# Patient Record
Sex: Female | Born: 1935 | Race: Black or African American | Hispanic: No | Smoking: Never smoker
Health system: Southern US, Community
[De-identification: ages and names within clinical notes are randomized; demographics above are authoritative.]

## PROBLEM LIST (undated history)

## (undated) DIAGNOSIS — H5440 Blindness, one eye, unspecified eye: Secondary | ICD-10-CM

## (undated) DIAGNOSIS — I10 Essential (primary) hypertension: Secondary | ICD-10-CM

## (undated) DIAGNOSIS — E119 Type 2 diabetes mellitus without complications: Secondary | ICD-10-CM

## (undated) DIAGNOSIS — F0390 Unspecified dementia without behavioral disturbance: Secondary | ICD-10-CM

## (undated) HISTORY — PX: REPLACEMENT TOTAL KNEE: SUR1224

---

## 2004-06-16 ENCOUNTER — Other Ambulatory Visit: Payer: Self-pay

## 2004-06-16 ENCOUNTER — Emergency Department: Payer: Self-pay | Admitting: Emergency Medicine

## 2004-12-06 ENCOUNTER — Ambulatory Visit: Payer: Self-pay | Admitting: Gastroenterology

## 2005-01-27 ENCOUNTER — Emergency Department: Payer: Self-pay | Admitting: Emergency Medicine

## 2006-02-11 ENCOUNTER — Emergency Department: Payer: Self-pay | Admitting: Internal Medicine

## 2006-06-21 ENCOUNTER — Inpatient Hospital Stay: Payer: Self-pay | Admitting: Internal Medicine

## 2006-06-21 ENCOUNTER — Other Ambulatory Visit: Payer: Self-pay

## 2007-02-22 ENCOUNTER — Ambulatory Visit: Payer: Self-pay | Admitting: Internal Medicine

## 2008-07-04 ENCOUNTER — Emergency Department: Payer: Self-pay | Admitting: Unknown Physician Specialty

## 2010-03-05 ENCOUNTER — Emergency Department: Payer: Self-pay | Admitting: Emergency Medicine

## 2010-07-25 ENCOUNTER — Emergency Department: Payer: Self-pay | Admitting: Emergency Medicine

## 2010-08-05 ENCOUNTER — Observation Stay: Payer: Self-pay | Admitting: Family Medicine

## 2010-10-10 ENCOUNTER — Emergency Department: Payer: Self-pay | Admitting: Internal Medicine

## 2010-10-27 ENCOUNTER — Emergency Department: Payer: Self-pay | Admitting: Emergency Medicine

## 2011-03-24 ENCOUNTER — Emergency Department: Payer: Self-pay | Admitting: *Deleted

## 2011-04-08 ENCOUNTER — Inpatient Hospital Stay: Payer: Self-pay | Admitting: Family Medicine

## 2011-04-08 LAB — COMPREHENSIVE METABOLIC PANEL
Albumin: 3.2 g/dL — ABNORMAL LOW (ref 3.4–5.0)
Alkaline Phosphatase: 84 U/L (ref 50–136)
Anion Gap: 6 — ABNORMAL LOW (ref 7–16)
BUN: 13 mg/dL (ref 7–18)
Bilirubin,Total: 0.4 mg/dL (ref 0.2–1.0)
Calcium, Total: 8.6 mg/dL (ref 8.5–10.1)
Creatinine: 0.78 mg/dL (ref 0.60–1.30)
EGFR (African American): >60
Glucose: 137 mg/dL — ABNORMAL HIGH (ref 65–99)
SGPT (ALT): 23 U/L
Sodium: 141 mmol/L (ref 136–145)

## 2011-04-08 LAB — CK TOTAL AND CKMB (NOT AT ARMC)
CK, Total: 314 U/L — ABNORMAL HIGH (ref 21–215)
CK, Total: 264 U/L — ABNORMAL HIGH (ref 21–215)
CK-MB: 6 ng/mL — ABNORMAL HIGH (ref 0.5–3.6)
CK-MB: 5.3 ng/mL — ABNORMAL HIGH (ref 0.5–3.6)

## 2011-04-08 LAB — CBC
HCT: 36.6 % (ref 35.0–47.0)
MCH: 29.8 pg (ref 26.0–34.0)
MCHC: 32.7 g/dL (ref 32.0–36.0)
MCV: 91 fL (ref 80–100)
Platelet: 201 10*3/uL (ref 150–440)
RBC: 4.01 10*6/uL (ref 3.80–5.20)
RDW: 13 % (ref 11.5–14.5)

## 2011-04-08 LAB — TROPONIN I: Troponin-I: 0.06 ng/mL — ABNORMAL HIGH

## 2011-04-08 LAB — PRO B NATRIURETIC PEPTIDE: B-Type Natriuretic Peptide: 2031 pg/mL — ABNORMAL HIGH (ref 0–450)

## 2011-04-09 LAB — CBC WITH DIFFERENTIAL/PLATELET
Basophil %: 0.2 %
Eosinophil #: 0 10*3/uL (ref 0.0–0.7)
Eosinophil %: 0.1 %
HCT: 33.5 % — ABNORMAL LOW (ref 35.0–47.0)
HGB: 11.1 g/dL — ABNORMAL LOW (ref 12.0–16.0)
Lymphocyte %: 15.4 %
MCH: 30.6 pg (ref 26.0–34.0)
Monocyte #: 0.2 10*3/uL (ref 0.0–0.7)
Monocyte %: 3.8 %
Neutrophil #: 4.5 10*3/uL (ref 1.4–6.5)
Neutrophil %: 80.5 %
Platelet: 186 10*3/uL (ref 150–440)

## 2011-04-09 LAB — MAGNESIUM: Magnesium: 1.9 mg/dL

## 2011-04-09 LAB — BASIC METABOLIC PANEL
Anion Gap: 12 (ref 7–16)
BUN: 20 mg/dL — ABNORMAL HIGH (ref 7–18)
Chloride: 106 mmol/L (ref 98–107)
Co2: 26 mmol/L (ref 21–32)
Creatinine: 0.99 mg/dL (ref 0.60–1.30)
EGFR (African American): >60
Potassium: 3.9 mmol/L (ref 3.5–5.1)
Sodium: 144 mmol/L (ref 136–145)

## 2011-04-09 LAB — TROPONIN I: Troponin-I: 0.05 ng/mL

## 2011-04-09 LAB — CK TOTAL AND CKMB (NOT AT ARMC): CK, Total: 183 U/L (ref 21–215)

## 2011-04-10 LAB — CBC WITH DIFFERENTIAL/PLATELET
Basophil #: 0 10*3/uL (ref 0.0–0.1)
Basophil %: 0.2 %
Eosinophil #: 0 10*3/uL (ref 0.0–0.7)
Eosinophil %: 0.1 %
HGB: 11.4 g/dL — ABNORMAL LOW (ref 12.0–16.0)
Lymphocyte %: 17 %
MCH: 30.3 pg (ref 26.0–34.0)
MCV: 92 fL (ref 80–100)
Monocyte #: 0.4 10*3/uL (ref 0.0–0.7)
Neutrophil %: 78.8 %
Platelet: 196 10*3/uL (ref 150–440)
RBC: 3.78 10*6/uL — ABNORMAL LOW (ref 3.80–5.20)
RDW: 14.1 % (ref 11.5–14.5)

## 2011-04-10 LAB — BASIC METABOLIC PANEL
Anion Gap: 13 (ref 7–16)
BUN: 20 mg/dL — ABNORMAL HIGH (ref 7–18)
Co2: 26 mmol/L (ref 21–32)
Creatinine: 0.87 mg/dL (ref 0.60–1.30)
EGFR (African American): >60
Sodium: 145 mmol/L (ref 136–145)

## 2011-04-13 LAB — CULTURE, BLOOD (SINGLE)

## 2011-09-02 ENCOUNTER — Ambulatory Visit: Payer: Self-pay | Admitting: Family Medicine

## 2011-12-15 ENCOUNTER — Emergency Department: Payer: Self-pay | Admitting: Internal Medicine

## 2011-12-15 LAB — COMPREHENSIVE METABOLIC PANEL
Albumin: 3.7 g/dL (ref 3.4–5.0)
Alkaline Phosphatase: 108 U/L (ref 50–136)
Anion Gap: 8 (ref 7–16)
BUN: 12 mg/dL (ref 7–18)
Bilirubin,Total: 0.4 mg/dL (ref 0.2–1.0)
Co2: 26 mmol/L (ref 21–32)
Creatinine: 0.81 mg/dL (ref 0.60–1.30)
Glucose: 202 mg/dL — ABNORMAL HIGH (ref 65–99)
Osmolality: 279 (ref 275–301)
SGPT (ALT): 26 U/L (ref 12–78)
Sodium: 137 mmol/L (ref 136–145)
Total Protein: 7.9 g/dL (ref 6.4–8.2)

## 2011-12-15 LAB — CK TOTAL AND CKMB (NOT AT ARMC)
CK, Total: 268 U/L — ABNORMAL HIGH (ref 21–215)
CK-MB: 5.8 ng/mL — ABNORMAL HIGH (ref 0.5–3.6)

## 2011-12-15 LAB — URINALYSIS, COMPLETE
Bilirubin,UR: NEGATIVE
Blood: NEGATIVE
Glucose,UR: 50 mg/dL (ref 0–75)
Ketone: NEGATIVE
Leukocyte Esterase: NEGATIVE
Ph: 8 (ref 4.5–8.0)
RBC,UR: 1 /HPF (ref 0–5)
Specific Gravity: 1.009 (ref 1.003–1.030)
Squamous Epithelial: 1
WBC UR: <1 /HPF (ref 0–5)

## 2011-12-15 LAB — CBC
HGB: 12.8 g/dL (ref 12.0–16.0)
MCH: 30.8 pg (ref 26.0–34.0)
RBC: 4.16 10*6/uL (ref 3.80–5.20)
RDW: 13.5 % (ref 11.5–14.5)
WBC: 7 10*3/uL (ref 3.6–11.0)

## 2011-12-15 LAB — TROPONIN I: Troponin-I: 0.03 ng/mL

## 2011-12-28 ENCOUNTER — Inpatient Hospital Stay: Payer: Self-pay | Admitting: Family Medicine

## 2011-12-28 LAB — COMPREHENSIVE METABOLIC PANEL
Albumin: 3.7 g/dL (ref 3.4–5.0)
Anion Gap: 8 (ref 7–16)
BUN: 10 mg/dL (ref 7–18)
Calcium, Total: 8.7 mg/dL (ref 8.5–10.1)
Chloride: 107 mmol/L (ref 98–107)
Co2: 27 mmol/L (ref 21–32)
EGFR (African American): >60
EGFR (Non-African Amer.): >60
Glucose: 128 mg/dL — ABNORMAL HIGH (ref 65–99)
Osmolality: 284 (ref 275–301)
Potassium: 3.9 mmol/L (ref 3.5–5.1)
SGOT(AST): 23 U/L (ref 15–37)
SGPT (ALT): 23 U/L (ref 12–78)
Total Protein: 7.7 g/dL (ref 6.4–8.2)

## 2011-12-28 LAB — URINALYSIS, COMPLETE
Bacteria: NONE SEEN
Bilirubin,UR: NEGATIVE
Blood: NEGATIVE
Glucose,UR: 50 mg/dL (ref 0–75)
Leukocyte Esterase: NEGATIVE
RBC,UR: 2 /HPF (ref 0–5)
Specific Gravity: 1.013 (ref 1.003–1.030)
Squamous Epithelial: 1
WBC UR: <1 /HPF (ref 0–5)

## 2011-12-28 LAB — CBC WITH DIFFERENTIAL/PLATELET
Basophil %: 1.3 %
Eosinophil #: 0 10*3/uL (ref 0.0–0.7)
Eosinophil %: 0.8 %
HCT: 40.2 % (ref 35.0–47.0)
HGB: 13.4 g/dL (ref 12.0–16.0)
Lymphocyte #: 1.5 10*3/uL (ref 1.0–3.6)
MCH: 31.4 pg (ref 26.0–34.0)
MCHC: 33.5 g/dL (ref 32.0–36.0)
MCV: 94 fL (ref 80–100)
Monocyte #: 0.3 x10 3/mm (ref 0.2–0.9)
Neutrophil #: 3.3 10*3/uL (ref 1.4–6.5)
Neutrophil %: 63.5 %
Platelet: 180 10*3/uL (ref 150–440)
RBC: 4.28 10*6/uL (ref 3.80–5.20)
RDW: 13.4 % (ref 11.5–14.5)

## 2011-12-28 LAB — LIPID PANEL
Cholesterol: 203 mg/dL — ABNORMAL HIGH (ref 0–200)
Ldl Cholesterol, Calc: 87 mg/dL (ref 0–100)
Triglycerides: 154 mg/dL (ref 0–200)
VLDL Cholesterol, Calc: 31 mg/dL (ref 5–40)

## 2011-12-28 LAB — HEMOGLOBIN A1C: Hemoglobin A1C: 6.7 % — ABNORMAL HIGH (ref 4.2–6.3)

## 2011-12-29 LAB — CBC WITH DIFFERENTIAL/PLATELET
Basophil #: 0.1 10*3/uL (ref 0.0–0.1)
Basophil %: 1.3 %
Eosinophil %: 0.1 %
HGB: 12.6 g/dL (ref 12.0–16.0)
Lymphocyte #: 0.9 10*3/uL — ABNORMAL LOW (ref 1.0–3.6)
Lymphocyte %: 20.3 %
MCH: 31 pg (ref 26.0–34.0)
MCHC: 33.3 g/dL (ref 32.0–36.0)
MCV: 93 fL (ref 80–100)
Monocyte %: 3.6 %
Neutrophil #: 3.4 10*3/uL (ref 1.4–6.5)
Neutrophil %: 74.7 %
RBC: 4.04 10*6/uL (ref 3.80–5.20)
WBC: 4.6 10*3/uL (ref 3.6–11.0)

## 2011-12-29 LAB — BASIC METABOLIC PANEL
Anion Gap: 6 — ABNORMAL LOW (ref 7–16)
Calcium, Total: 8.7 mg/dL (ref 8.5–10.1)
Chloride: 106 mmol/L (ref 98–107)
Co2: 28 mmol/L (ref 21–32)
Creatinine: 0.8 mg/dL (ref 0.60–1.30)
EGFR (Non-African Amer.): >60
Osmolality: 280 (ref 275–301)
Sodium: 140 mmol/L (ref 136–145)

## 2011-12-29 LAB — CK TOTAL AND CKMB (NOT AT ARMC)
CK, Total: 205 U/L (ref 21–215)
CK, Total: 197 U/L (ref 21–215)
CK-MB: 3.9 ng/mL — ABNORMAL HIGH (ref 0.5–3.6)
CK-MB: 3.6 ng/mL (ref 0.5–3.6)

## 2011-12-29 LAB — TROPONIN I
Troponin-I: 0.04 ng/mL
Troponin-I: 0.04 ng/mL

## 2011-12-30 LAB — BASIC METABOLIC PANEL
BUN: 11 mg/dL (ref 7–18)
Calcium, Total: 9.3 mg/dL (ref 8.5–10.1)
Chloride: 103 mmol/L (ref 98–107)
Co2: 28 mmol/L (ref 21–32)
Creatinine: 0.89 mg/dL (ref 0.60–1.30)
Potassium: 3.8 mmol/L (ref 3.5–5.1)

## 2011-12-31 LAB — BASIC METABOLIC PANEL
Calcium, Total: 9.1 mg/dL (ref 8.5–10.1)
EGFR (African American): 59 — ABNORMAL LOW
EGFR (Non-African Amer.): 51 — ABNORMAL LOW
Osmolality: 282 (ref 275–301)
Potassium: 3.5 mmol/L (ref 3.5–5.1)
Sodium: 140 mmol/L (ref 136–145)

## 2014-05-09 NOTE — H&P (Signed)
PATIENT NAME:  Stephanie Graham, Stephanie Graham MR#:  [id number] DATE OF BIRTH:  Jun 19, 1921  DATE OF ADMISSION:  12/28/2011  ADMITTING PHYSICIAN: Enid Baas, MD    PRIMARY MD: Dr. Burnadette Pop   CHIEF COMPLAINT: Headache and right-sided blurred vision.   HISTORY OF PRESENT ILLNESS: Stephanie Graham is an 81 year old African American female with past medical history significant for non-insulin-dependent diabetes mellitus, hypertension, hyperlipemia, and also congestive heart failure with systolic dysfunction, EF of 40% who comes from home secondary to worsening headache and also right eye blurred vision that started today. The patient denies being sick lately. She states that she has been taking her medications like she is supposed to. She is only on low dose blood pressure medications, lisinopril 20 mg daily and Coreg 6.25 mg b.i.d. She says she has not been stressed out or anxious or in pain lately. She has never had this episode of high blood pressure. She called EMS for her headache and right eye blurred vision today and her blood pressure was found to be as high as 226/125. She was brought in to the ED. She was given labetalol IV and also one dose of IV hydralazine with not much improvement in her blood pressure so she is being admitted for malignant hypertension.   PAST MEDICAL HISTORY:  1. Hypertension.  2. Non-insulin-dependent diabetes mellitus.  3. Hyperlipidemia.  4. Congestive heart failure with systolic dysfunction, EF of 40%.   PAST SURGICAL HISTORY:  1. Vaginal hysterectomy.  2. Right knee surgery.   ALLERGIES TO MEDICATIONS: Intolerant to verapamil, Maxzide, and simvastatin. No known drug allergies otherwise.   CURRENT MEDICATIONS AT HOME:  1. Tylenol 500 mg p.o. q.8 hours p.r.n.  2. Coreg 6.25 mg p.o. b.i.d.  3. Donepezil 10 mg p.o. at bedtime.  4. Glimepiride 2 mg p.o. daily.  5. Lisinopril 20 mg p.o. daily.  6. Pravastatin 20 mg p.o. daily.   SOCIAL HISTORY: Lives at home by  herself. Her daughter comes and stays with her at nighttime if she needs. No history of any smoking, alcohol, or drug use.   FAMILY HISTORY: Dad lived up to 21 years old and mom died in her 19's from stroke.   REVIEW OF SYSTEMS: CONSTITUTIONAL: No fever, fatigue, or weakness. EYES: Positive for right eye blurred vision and also cataracts. No glaucoma or inflammation. ENT: No tinnitus, ear pain, epistaxis, or discharge. Positive for moderate hearing loss. RESPIRATORY: No cough, wheeze, hemoptysis, or COPD. CARDIOVASCULAR: No chest pain, orthopnea, edema, arrhythmia, palpitations, or syncope. GI: No nausea, vomiting, diarrhea, abdominal pain, hematemesis, or melena. GU: No dysuria, hematuria, renal calculus, frequency, or incontinence. ENDOCRINE: No polyuria, nocturia, thyroid problems, heat or cold intolerance. HEMATOLOGY: No anemia, easy bruising or bleeding. SKIN: No acne, rash, or lesions. MUSCULOSKELETAL: No neck, back, shoulder pain, arthritis, or gout. Positive for headache. NEUROLOGIC: Other than headache in right cerebellar region, no prior CVA, TIA, or seizures. PSYCHOLOGICAL: No anxiety, insomnia, or depression.   PHYSICAL EXAMINATION:   VITAL SIGNS: Temperature 98 degrees Fahrenheit, pulse 67, respirations 18, blood pressure 226/125, pulse oximetry 99% on room air.   GENERAL: Well built, well nourished female lying in bed not in any acute distress.   HEENT: Normocephalic, atraumatic. The right pupil is 3 mm with sluggish reaction to light. Left pupil is 2 mm with normal reaction to light. Anicteric sclerae. Extraocular movements are intact. Oropharynx is clear without erythema, mass, or exudates.   NECK: Supple. No thyromegaly, JVD, or carotid bruits. No lymphadenopathy.   LUNGS: Moving air  bilaterally. No wheeze or crackles. No use of accessory muscles for breathing.   CARDIOVASCULAR: S1, S2 regular rate and rhythm. No murmurs, rubs, or gallops.   ABDOMEN: Soft, nontender,  nondistended. No hepatosplenomegaly. Normal bowel sounds.   EXTREMITIES: No pedal edema. No clubbing or cyanosis. 2+ dorsalis pedis pulses palpable bilaterally.   SKIN: No acne, rash, or lesions. Very dry and flaky skin.   LYMPHATICS: No cervical lymphadenopathy.   NEUROLOGIC: She has 5/5 strength in all four extremities. No focal sensory symptoms. All cranial nerves are intact except she has some right-sided hemianopsia on exam.   PSYCHOLOGICAL: The patient is awake, alert, oriented and x3.   LABORATORY, DIAGNOSTIC, AND RADIOLOGICAL DATA: WBC 5.2, hemoglobin 13.4, hematocrit 40.2, platelet count 180, sodium 142, potassium 3.9, chloride 107, bicarb 27, BUN 10, creatinine 0.84, glucose 128, calcium 8.7, ALT 23, AST 23, alkaline phosphatase 108, total bilirubin 0.4, calcium 8.7, albumin 3.7. First set of troponin slightly elevated at 0.06.   Chest x-ray is showing no acute cardiopulmonary disease, mild right hilar prominence secondary to the patient's positioning. No focal pulmonary opacities or edema seen.   CT of the head without contrast showing no acute intracranial findings. Paranasal sinus disease is present. Chronic microangiopathy is present.   EKG showing normal sinus rhythm, heart rate of 65, no acute ST-T wave abnormalities.   ASSESSMENT AND PLAN: This is an 81 year old female with history of hypertension, diabetes, and CHF with systolic dysfunction admitted for malignant hypertension, headache, and right-sided blurred vision. 1. Malignant hypertension. Blood pressure not improving with IV dose of labetalol and hydralazine so starting on labetalol drip. Will admit to CCU. Restart her oral medications when can be weaned off the drip and adjust the doses once blood pressure is reasonably controlled.  2. Right-sided blurred vision likely from elevated intracranial pressure from her malignant hypertension, cannot rule out TIA. The patient is not taking any aspirin or other antiplatelet  agents at home. Discussed with neurologist, Dr. Sherryll BurgerShah, over the phone and ordering MRI, Doppler's, and echocardiogram at this time. CT of the head is negative.  3. Diabetes mellitus. On sliding scale insulin and glimepiride.  4. Hyperlipidemia. Continue statin.  5. GI and DVT prophylaxis. On Protonix and sub-Q heparin.   CODE STATUS: FULL CODE.   TIME SPENT ON ADMISSION: 50 minutes. The patient will be transferred to Dr. Sherryll BurgerLinthavong's service.    ____________________________ Enid Baasadhika Zoei Amison, MD rk:drc D: 12/28/2011 21:46:07 ET T: 12/29/2011 07:11:03 ET JOB#: [id number]  cc: Enid Baasadhika Flora Parks, MD, <Dictator> Marisue IvanKanhka Linthavong, MD Enid BaasADHIKA Markon Jares MD ELECTRONICALLY SIGNED 01/06/2012 14:59

## 2014-05-09 NOTE — Discharge Summary (Signed)
PATIENT NAME:  Stephanie Graham, Stephanie Graham MR#:  [id number] DATE OF BIRTH:  [birth year]  DATE OF ADMISSION:  12/28/2011 DATE OF DISCHARGE:  12/31/2011  DISCHARGE DIAGNOSES:  1. Malignant hypertension.  2. Adult onset diabetes.  3. Systolic congestive heart failure with ejection fraction 40%.  4. Dementia.  5. Hypertriglyceridemia.  6. Bilateral cataracts.   DISCHARGE MEDICATIONS:  1. Coreg 6.25 mg p.o. b.i.d.  2. Pravastatin 20 mg p.o. at bedtime.  3. Glimepiride 2 mg p.o. daily.  4. Donepezil 10 mg p.o. at bedtime.  5. Acetaminophen 500 mg t.i.d. p.r.n. for pain.  6. Lisinopril 40 mg p.o. daily.  7. Aspirin 81 mg p.o. daily.   CONSULTS: None.   PROCEDURES: The patient had an MRI of the brain that was negative. The patient also had carotid Doppler's that were negative.   PERTINENT LABS PRIOR TO DISCHARGE: Sodium 140, potassium 3.5, creatinine 0.99, glucose 97, A1c of 6.7%, LDL 87.   BRIEF HOSPITAL COURSE:  1. Malignant hypertension: The patient initially came in with elevated blood pressures greater than 200 systolic with symptoms associated, which included headaches and visual disturbances. Other symptoms have started to improve. She continues to have some tearing of the right eye without any pain.  The plan is for her to be evaluated by ophthalmology as an outpatient and we will set that up in the clinic. As far as her blood pressure goes, we will continue with the Coreg as prescribed but we increased the lisinopril to 40 mg daily. There is some history of poor compliance on the patient's part due to her underlying dementia. I spoke to her daughter. Plan to have Home Health nursing monitor for medication compliance. She will follow up closely with me in the outpatient setting next week and monitor her blood pressures that way.  2. Other chronic medical issues remained stable. No changes to those regimens.   DISPOSITION: Stable condition, being discharged to home with Home Health nursing to  help with medication compliance.   FOLLOWUP: Follow up with Dr. Burnadette PopLinthavong in a week.    ____________________________ Stephanie IvanKanhka Belinda Bringhurst, MD kl:bjt D: 12/31/2011 08:10:42 ET T: 12/31/2011 10:33:31 ET JOB#: [id number]  cc: Stephanie IvanKanhka Zigmund Linse, MD, <Dictator> Stephanie IvanKANHKA Dainelle Hun MD ELECTRONICALLY SIGNED 12/31/2011 12:17

## 2014-05-14 NOTE — Consult Note (Signed)
General Aspect patient is an 81 year old female with history of systemic arterial hypertension, diabetes and hyperlipidemia who presented to the emergency room with progressive cough productive of yellow sputum with associated shortness of breath weakness and fatigue. She complained of chills. She has a history of pulmonary hypertension by echo done in the recent past with evidence of a cardiomyopathy by echo done at [facility] with a reported ejection fraction of 37%. She presented to the emergency room where she was evaluated. Her serum troponin was mildly elevated at 0.06 with a subsequent level of 0.09 she denied exertional chest pain but complained of pleuritic chest pain with cough productive of yellowish sputum. She is now admitted where she is resting comfortably in bed. She states she is less short of breath. She denies chest pain. She denies prior cardiac history. Risk factors include hypertension, diabetes and hyperlipidemia. She is currently hemodynamically stable. Electrocardiogram reveals sinus rhythm with nonspecific ST-T wave changes.   Physical Exam:   GEN NAD, obese    HEENT PERRL    NECK supple  No masses    RESP rhonchi    CARD Regular rate and rhythm  Murmur    Murmur Systolic    Systolic Murmur Out flow    ABD denies tenderness  normal BS    LYMPH negative neck    EXTR negative cyanosis/clubbing, positive edema    SKIN normal to palpation    NEURO cranial nerves intact, motor/sensory function intact    PSYCH poor insight   Review of Systems:   Subjective/Chief Complaint cough with shortness of breath, fever and chills    General: Fever/chills  Weakness    Skin: No Complaints    ENT: No Complaints    Eyes: No Complaints    Neck: No Complaints    Respiratory: Frequent cough  Short of breath  Sputum    Cardiovascular: pleuritic chest pain    Gastrointestinal: No Complaints    Genitourinary: No Complaints    Vascular: No Complaints     Musculoskeletal: No Complaints    Neurologic: No Complaints    Hematologic: No Complaints    Endocrine: No Complaints    Psychiatric: No Complaints    Review of Systems: All other systems were reviewed and found to be negative    Medications/Allergies Reviewed Medications/Allergies reviewed   EKG:   EKG NSR    No Known Allergies:     Impression patient is an 81 year old female with history of mild cardiomyopathy by echocardiogram at [facility] with ejection fraction of 37%. She was admitted with increasing shortness of breath with cough productive of yellowish sputum. She has a mild serum troponin elevation. She complained of cough productive of yellow sputum with fever and chills but denies exertional chest pain. She denies any prior cardiac history although is somewhat difficult historian secondary to mild dementia. She has a mild serum troponin elevation which appears to be of unclear etiology. Most likely appears to be secondary to increased demand as the patient does not appear to have suffered an acute coronary event. Would continue to aggressively treat her upper respiratory infection. Chest x-ray does not reveal any significant chest of heart failure or pneumonic process.    Plan 1. Continue to treat upper respiratory infection with empiric antibiotics and bronchodilators as needed 2. Continue to treat her diabetes mellitus, hypertension hyperlipidemia you're doing 3. Echocardiogram to evaluate left ventricular function and for evidence of the artery disease 4. Low-fat, low-cholesterol, low-sodium, ADA diet  5. Would not proceed with cardiac invasive evaluation at this time unless clinical course changes. We'll follow with you   Electronic Signatures: Dalia HeadingFath, Yina Riviere A (MD)  (Signed 19-Mar-13 21:55)  Authored: General Aspect/Present Illness, History and Physical Exam, Review of System, EKG , Allergies, Impression/Plan   Last Updated: 19-Mar-13 21:55 by Dalia HeadingFath, Cydnee Fuquay A  (MD)

## 2014-05-14 NOTE — Discharge Summary (Signed)
PATIENT NAME:  Stephanie Graham, Stephanie Graham MR#:  [id number] DATE OF BIRTH:  1921/12/12  DATE OF ADMISSION:  04/08/2011 DATE OF DISCHARGE:  04/10/2011   DISCHARGE DIAGNOSES:  1. Acute exertional dyspnea that is improved.  2. Systolic congestive heart failure with an EF of 40% on echo 03/2011.  3. Hypertension.  4. Adult onset diabetes.  5. Hypertriglyceridemia.  6. Dementia.   DISCHARGE MEDICATIONS:  1. Aspirin 81 mg p.o. daily.  2. Amaryl 2 mg p.o. daily.  3. Coreg 6.25 mg p.o. b.i.d.  4. Pravastatin 20 mg p.o. daily.  5. Lisinopril 20 mg p.o. daily.  6. Prednisone taper as directed.  7. Doxy 100 mg p.o. b.i.d. x8 more days.  8. Albuterol 90 mcg 2 puffs q.4 hours p.r.n. for wheezing.   CONSULT: Cardiology per Dr. Lady GaryFath    PROCEDURES: The patient underwent an echocardiogram that did show an EF of 40% and did show mild global hypokinesis.   PERTINENT LABS ON DAY OF DISCHARGE: Sodium 145, potassium 3.6, BUN 20, creatinine 0.87, glucose 106, white blood cell count 9.6, hemoglobin 11.4, platelets 196. BNP upon arrival was 2031.   BRIEF HOSPITAL COURSE:  1. Exertional dyspnea. The patient initially arrived with complaints of progressive exertional dyspnea. Chest x-ray was negative for acute pulmonary edema or acute infiltrate. It was thought that her exertional dyspnea was multifactorial secondary to acute bronchitis or underlying systolic congestive heart failure. She was placed on oral antibiotics and switched to doxy and started on prednisone and inhaled albuterol. She responded very well. She underwent echocardiogram per Cardiology recommendations which did show an EF of 40%. Her previous EF was 37% in July. Breathing has drastically improved and she is able to ambulate without issues at this time.  2. History of systolic congestive heart failure. The patient has EF of 40% seen on the echo done during his hospitalization. No significant changes except for increase in lisinopril to 20 mg because of  her blood pressure. There are no signs of fluid overload at this time.  3. Hypertension. She was elevated during her hospital stay and lisinopril was increased to 20 mg. Plan to continue with the Coreg.  4. Other chronic medical issues remained stable. No changes.    DISPOSITION: She is in stable condition to be discharged to home. She will follow-up with Dr. Burnadette PopLinthavong in 1 to 2 weeks.   ____________________________ Marisue IvanKanhka Bernadine Melecio, MD kl:drc D: 04/10/2011 07:50:38 ET T: 04/10/2011 10:52:39 ET JOB#: [id number]  cc: Marisue IvanKanhka Mickey Esguerra, MD, <Dictator> Marisue IvanKANHKA Almyra Birman MD ELECTRONICALLY SIGNED 04/16/2011 16:47

## 2014-05-14 NOTE — H&P (Signed)
PATIENT NAME:  Stephanie Graham, Stephanie Graham MR#:  [id number] DATE OF BIRTH:  [birth year]  DATE OF ADMISSION:  04/08/2011  ER REFERRING PHYSICIAN: Sheryl L. Mindi Junker, MD  PRIMARY CARE PHYSICIAN: Marisue Ivan, MD  CHIEF COMPLAINT: Dyspnea on exertion and shortness of breath.   HISTORY OF PRESENT ILLNESS: The patient is an 81 year old African American female with past medical history of diabetes, hypertension, hyperlipidemia who reports that she has been having cough with yellow expectoration for the past couple a days. She denies any fever but has been having some light sweats. She came to the hospital today because she got worried. She came to the hospital today because for the past 1 to 2 days she has also been having shortness of breath and especially dyspnea on exertion. The patient reports that normally she can ambulate to her car from her apartment without any difficulty, but in the last 1 to 2 days she has been becoming very fatigued and tired just walking to her car. She also felt a little dizzy today and has a headache; therefore, she came to the Emergency Room.  The patient denies ever having a problem with her heart or myocardial infarction in the past, although her troponins are mildly elevated today. She was admitted for near syncope in 2010 and at that time her echocardiogram showed normal ejection fraction of 50% to 55%, mild to moderate MR, and moderate to severe tricuspid regurgitation. At that time her echocardiogram has shown ejection fraction of 50% to 55%, mild to moderate MR and moderate to severe TR. Her carotid echo Dopplers have shown no increased stenosis.   ALLERGIES: No known drug allergies. The patient reports that she is intolerant of Maxzide verapamil, and simvastatin.   PAST MEDICAL HISTORY: 1. Diabetes.  2. Hypertension.  3. Hyperlipidemia.  4. History of vaginal hysterectomy.  5. Right knee surgery.  6. Bilateral cataract surgery.  MEDICATIONS: Amaryl 2 mg daily.  Coreg 6.25 mg b.i.d. Aspirin 81 mg daily. Lisinopril 10 mg daily. Pravachol 20 mg daily.   FAMILY HISTORY: Mother died from stroke. Sister has diabetes.   SOCIAL HISTORY: Denies any history of smoking, alcohol or drug abuse. The patient lives in her own apartment; however, her daughter has been staying with her for the past two days since she has not been feeling well.   REVIEW OF SYSTEMS:  CONSTITUTIONAL: The patient denies any fever. Reports fatigue and weakness.   EYES: Denies any blurred or double vision. Wears glasses.   ENT: Denies any tinnitus, ear pain.   RESPIRATORY: Reports cough, expectoration yellow-green.  CARDIOVASCULAR: Denies any chest pain, palpitations, syncope.   GI: Denies any nausea, vomiting, diarrhea, or abdominal pain.   GU: Denies any dysuria or hematuria.  ENDOCRINOLOGY: Denies any polyuria, nocturia.   HEME/LYMPH: Denies any anemia or easy bruisability.  INTEGUMENTARY: Denies any acne, rash.   MUSCULOSKELETAL: Denies any swelling or gout.   NEUROLOGICAL: Denies any numbness or weakness.   PSYCH: Denies any anxiety or depression.    PHYSICAL EXAMINATION: VITAL SIGNS: Temperature 96.2, heart rate 90, respiratory rate 18, blood pressure 182/85, pulse oximetry 99% on room air.   GENERAL: The patient is an 81 year old African American female, not in acute distress.   HEAD: Atraumatic, normocephalic.   EYES: There is mild pallor. No icterus or cyanosis. Pupils equal, round, and reactive to light and accommodation. Extraocular movements intact.   ENT: Wet mucous membranes. No oropharyngeal erythema or thrush.   NECK: Supple. No masses. No JVD. No thyromegaly. No lymphadenopathy.  CHEST WALL: No tenderness to palpation. Not using accessory muscles of respiration. No  intercostal muscle retractions.   LUNGS: The patient has bilateral coarse breath sounds, rhonchi with prolonged expiration. No crepitus.   CARDIOVASCULAR: S1, S2 regular. There is a  systolic murmur. No rubs or gallops.   ABDOMEN: Soft, nontender, and nondistended. No guarding or rigidity. No organomegaly. Normal bowel sounds.   SKIN: No rashes or lesions.  PERIPHERIES: No pedal edema, 1+ pedal pulses.   MUSCULOSKELETAL: No cyanosis, clubbing.   NEUROLOGICAL: Awake, alert, oriented x3. Nonfocal neurological exam. Cranial nerves grossly intact.   PSYCH: Normal mood and affect.  LABORATORY, DIAGNOSTIC, AND RADIOLOGICAL DATA: Chest x-ray shows no acute abnormalities. CK 314. Troponin 0.09. Glucose 137. The rest of the complete metabolic panel is normal. CBC normal.   ASSESSMENT AND PLAN: This is an 81 year old female with past medical history of diabetes, hypertension, hyperlipidemia who presents with dyspnea on exertion, cough and shortness of breath with yellow expectoration.   1. Possible acute bronchitis. The patient denies any fever. She reports cough and yellow expectoration. On exam, she has rhonchi and coarse breath sounds. Will obtain blood cultures and start empiric antibiotics, inhalers, respiratory treatments and a short prednisone taper.  2. Dyspnea on exertion. The patient reports sudden onset of dyspnea on exertion. Her troponins are mildly elevated. She denies ever having any chest pains. Her echocardiogram during her last hospitalization was normal except for valvular abnormalities including moderate MR and moderate to severe TR. Will repeat a 2-D echocardiogram, check serial cardiac enzymes. Obtain a cardiology consultation. Continue aspirin, beta blocker, ACE inhibitor, nitro paste, and statin. Will consider anticoagulant therapy with heparin if her troponins continue to increase. 3. Diabetes, appears to be well controlled at present. Will continue Amaryl. Will place on insulin sliding scale and a carbohydrate-controlled diet.  4. Accelerated hypertension. The patient's blood pressure is 182/85. Will continue Coreg, lisinopril, place on nitro paste, and add  p.r.n. hydralazine. Will adjust medications to achieve good hypertensive control.  5. Hyperlipidemia. Will continue statin therapy.   Reviewed all medical records. Discussed with the ED physician. Discussed with the patient the plan of care and management.  TIME: 75 minutes.   ____________________________ Darrick MeigsSangeeta Tyrianna Lightle, MD sp:kma D: 04/08/2011 11:04:47 ET T: 04/08/2011 11:44:11 ET JOB#: [id number]  cc: Darrick MeigsSangeeta Vian Fluegel, MD, <Dictator> Marisue IvanKanhka Linthavong, MD Darrick MeigsSANGEETA Lujain Kraszewski MD ELECTRONICALLY SIGNED 04/11/2011 13:39

## 2016-03-16 ENCOUNTER — Emergency Department: Payer: Medicare Other

## 2016-03-16 ENCOUNTER — Encounter: Payer: Self-pay | Admitting: Emergency Medicine

## 2016-03-16 ENCOUNTER — Emergency Department
Admission: EM | Admit: 2016-03-16 | Discharge: 2016-03-16 | Disposition: A | Discharge status: Home / Self Care | Payer: Medicare Other | Attending: Emergency Medicine | Admitting: Emergency Medicine

## 2016-03-16 DIAGNOSIS — M546 Pain in thoracic spine: Secondary | ICD-10-CM | POA: Insufficient documentation

## 2016-03-16 DIAGNOSIS — Z79899 Other long term (current) drug therapy: Secondary | ICD-10-CM | POA: Insufficient documentation

## 2016-03-16 DIAGNOSIS — G8929 Other chronic pain: Secondary | ICD-10-CM

## 2016-03-16 DIAGNOSIS — M25511 Pain in right shoulder: Secondary | ICD-10-CM | POA: Insufficient documentation

## 2016-03-16 DIAGNOSIS — E119 Type 2 diabetes mellitus without complications: Secondary | ICD-10-CM | POA: Insufficient documentation

## 2016-03-16 DIAGNOSIS — E860 Dehydration: Secondary | ICD-10-CM | POA: Insufficient documentation

## 2016-03-16 DIAGNOSIS — Z7984 Long term (current) use of oral hypoglycemic drugs: Secondary | ICD-10-CM | POA: Diagnosis not present

## 2016-03-16 DIAGNOSIS — M25512 Pain in left shoulder: Secondary | ICD-10-CM | POA: Diagnosis not present

## 2016-03-16 DIAGNOSIS — I10 Essential (primary) hypertension: Secondary | ICD-10-CM | POA: Insufficient documentation

## 2016-03-16 DIAGNOSIS — Z7982 Long term (current) use of aspirin: Secondary | ICD-10-CM | POA: Diagnosis not present

## 2016-03-16 HISTORY — DX: Type 2 diabetes mellitus without complications: E11.9

## 2016-03-16 HISTORY — DX: Essential (primary) hypertension: I10

## 2016-03-16 HISTORY — DX: Blindness, one eye, unspecified eye: H54.40

## 2016-03-16 LAB — COMPREHENSIVE METABOLIC PANEL
ALT: 14 U/L (ref 14–54)
AST: 21 U/L (ref 15–41)
Albumin: 4.1 g/dL (ref 3.5–5.0)
Alkaline Phosphatase: 69 U/L (ref 38–126)
Anion gap: 8 (ref 5–15)
BUN: 19 mg/dL (ref 6–20)
CHLORIDE: 108 mmol/L (ref 101–111)
CO2: 24 mmol/L (ref 22–32)
Calcium: 9.5 mg/dL (ref 8.9–10.3)
Creatinine, Ser: 1.12 mg/dL — ABNORMAL HIGH (ref 0.44–1.00)
GFR, EST AFRICAN AMERICAN: 48 mL/min — AB (ref >60)
GFR, EST NON AFRICAN AMERICAN: 41 mL/min — AB (ref >60)
Glucose, Bld: 156 mg/dL — ABNORMAL HIGH (ref 65–99)
POTASSIUM: 4.4 mmol/L (ref 3.5–5.1)
Sodium: 140 mmol/L (ref 135–145)
TOTAL PROTEIN: 7.4 g/dL (ref 6.5–8.1)
Total Bilirubin: 0.3 mg/dL (ref 0.3–1.2)

## 2016-03-16 LAB — URINALYSIS, COMPLETE (UACMP) WITH MICROSCOPIC
BACTERIA UA: NONE SEEN
BILIRUBIN URINE: NEGATIVE
Glucose, UA: NEGATIVE mg/dL
Hgb urine dipstick: NEGATIVE
Ketones, ur: NEGATIVE mg/dL
Leukocytes, UA: NEGATIVE
NITRITE: NEGATIVE
PH: 6 (ref 5.0–8.0)
Protein, ur: NEGATIVE mg/dL
RBC / HPF: NONE SEEN RBC/hpf (ref 0–5)
SPECIFIC GRAVITY, URINE: 1.001 — AB (ref 1.005–1.030)

## 2016-03-16 LAB — CBC WITH DIFFERENTIAL/PLATELET
BASOS ABS: 0 10*3/uL (ref 0–0.1)
Basophils Relative: 1 %
EOS ABS: 0.1 10*3/uL (ref 0–0.7)
EOS PCT: 2 %
HCT: 36.3 % (ref 35.0–47.0)
HEMOGLOBIN: 12.2 g/dL (ref 12.0–16.0)
LYMPHS PCT: 37 %
Lymphs Abs: 1.6 10*3/uL (ref 1.0–3.6)
MCH: 31.7 pg (ref 26.0–34.0)
MCHC: 33.8 g/dL (ref 32.0–36.0)
MCV: 94 fL (ref 80.0–100.0)
Monocytes Absolute: 0.3 10*3/uL (ref 0.2–0.9)
Monocytes Relative: 7 %
NEUTROS PCT: 53 %
Neutro Abs: 2.3 10*3/uL (ref 1.4–6.5)
PLATELETS: 213 10*3/uL (ref 150–440)
RBC: 3.86 MIL/uL (ref 3.80–5.20)
RDW: 14.5 % (ref 11.5–14.5)
WBC: 4.3 10*3/uL (ref 3.6–11.0)

## 2016-03-16 LAB — TROPONIN I: Troponin I: <0.03 ng/mL (ref <0.03)

## 2016-03-16 MED ORDER — SODIUM CHLORIDE 0.9 % IV BOLUS (SEPSIS)
1000.0000 mL | Freq: Once | INTRAVENOUS | Status: AC
  Administered 2016-03-16: 1000 mL via INTRAVENOUS

## 2016-03-16 NOTE — ED Provider Notes (Signed)
Asked to follow up on urinalysis and if normal discharge per Dr. Shaune PollackLord. Urinalysis normal, okay for discharge   Jene Everyobert Remmington Urieta, MD 03/16/16 1553

## 2016-03-16 NOTE — ED Triage Notes (Signed)
Pt reports "sweating a lot" and numbness in right shoulder for past week "or so". "soreness in right shoulder and back. Daughter states her mother has been taking the tylenol too frequently. Every 2 hours per daughter extra strength.

## 2016-03-16 NOTE — Discharge Instructions (Signed)
You were evaluated for shoulder pains and back pain and sweats at night, and as we discussed her exam and evaluation are reassuring in the emergency room today. Your kidney function showed evidence of mild dehydration, and your given IV fluids here in the emergency department. Make sure you drink fluids.  Initially take only the dose of Tylenol as directed on labeling, do not go over this level.  Return to the emergency department immediately for any new or worsening symptoms including chest pain, abdominal pain, vomiting, nausea, fever, or any other symptoms concerning to you.

## 2016-03-16 NOTE — ED Provider Notes (Signed)
[facility] Emergency Department Provider Note ____________________________________________   I have reviewed the triage vital signs and the triage nursing note.  HISTORY  Chief Complaint Numbness and Excessive Sweating   Historian Limited history as patient is a bit of a poor historian, daughter does not live with her but checked on her last night and provided some additional history  HPI Stephanie Graham is a 83 y.o. female lives alone, history of hypertension, diabetes, states that it sounds like may be for the past month she has been taking Tylenol in the evenings for achy pain across both shoulders and her back. She takes Tylenol PM. It's unclear if she is having more frequent or worsening of symptoms, but her daughter believes that she is now taking the Tylenol up to every 3 hours throughout the day. The patient states that she also has sweats at night and wakes up wet from sweats. She denies chest pain. She denies nausea or diarrhea. She denies fever. When specifically asked if she thinks maybe she's having indigestion, she said she doesn't think so.  She said decreased appetite, although no specific weight loss was mentioned.    Past Medical History:  Diagnosis Date  . 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 Diabetes mellitus without complication (HCC)   . Hypertension     There are no active problems to display for this patient.   Past Surgical History:  Procedure Laterality Date  . REPLACEMENT TOTAL KNEE Left     Prior to Admission medications   Medication Sig Start Date End Date Taking? Authorizing Provider  acetaminophen (TYLENOL) 650 MG CR tablet Take 1,300 mg by mouth every 8 (eight) hours as needed for pain.   Yes Historical Provider, MD  amLODipine (NORVASC) 5 MG tablet Take 5 mg by mouth daily. 01/01/16  Yes Historical Provider, MD  aspirin EC 81 MG tablet Take 81 mg by mouth daily.   Yes Historical Provider,  MD  carvedilol (COREG) 12.5 MG tablet Take 12.5 mg by mouth 2 (two) times daily. 02/27/16  Yes Historical Provider, MD  donepezil (ARICEPT) 10 MG tablet Take 10 mg by mouth at bedtime. 02/06/16  Yes Historical Provider, MD  metFORMIN (GLUCOPHAGE) 500 MG tablet Take 500-1,000 mg by mouth 2 (two) times daily. tk 2 tabs qam and 1 tab qpm 02/08/16  Yes Historical Provider, MD  pravastatin (PRAVACHOL) 40 MG tablet Take 80 mg by mouth at bedtime. 01/04/16  Yes Historical Provider, MD    No Known Allergies  No family history on file.  Social History Social History  Substance Use Topics  . Smoking status: Never Smoker  . Smokeless tobacco: Former Neurosurgeon  . Alcohol use No    Review of Systems  Constitutional: Negative for fever. Eyes: Negative for visual changes. ENT: Negative for sore throat. Cardiovascular: Negative for chest pain. Respiratory: Negative for shortness of breath. Gastrointestinal: Negative for abdominal pain, vomiting and diarrhea. Genitourinary: Negative for dysuria. Musculoskeletal: Pain across both shoulders and the upper back, she states that it feels "funny" and that it's deep. Skin: Negative for rash. Neurological: Negative for headache.  Daughter thought that she's been stating that her shoulders and back are numb, but when I ask her she says that it's not numb it's a "deep" feeling. 10 point Review of Systems otherwise negative ____________________________________________   PHYSICAL EXAM:  VITAL SIGNS: ED Triage Vitals  Enc Vitals Group     BP 03/16/16 0837 98/76  Pulse Rate 03/16/16 0837 74     Resp 03/16/16 0837 16     Temp 03/16/16 0837 98.8 F (37.1 C)     Temp Source 03/16/16 0837 Oral     SpO2 03/16/16 0837 97 %     Weight 03/16/16 0838 150 lb (68 kg)     Height 03/16/16 0838 5\' 2"  (1.575 m)     Head Circumference --      Peak Flow --      Pain Score --      Pain Loc --      Pain Edu? --      Excl. in GC? --      Constitutional: Alert and  Cooperative, slightly poor historian. Well appearing and in no distress. HEENT   Head: Normocephalic and atraumatic.      Eyes: Conjunctivae are normal. Normal extraocular movements.  Right eye blind, cloudy.      Ears:         Nose: No congestion/rhinnorhea.   Mouth/Throat: Mucous membranes are moist.   Neck: No stridor.   Cardiovascular/Chest: Normal rate, regular rhythm.  No murmurs, rubs, or gallops.  Chest wall nontender to palpation. Respiratory: Normal respiratory effort without tachypnea nor retractions. Breath sounds are clear and equal bilaterally. No wheezes/rales/rhonchi. Gastrointestinal: Soft. No distention, no guarding, no rebound. Nontender.   Genitourinary/rectal:Deferred Musculoskeletal: Some kyphosis. No specific bony point tenderness or pain when she ranges her shoulder for me. No point tenderness of the spine. Pelvis is tender. Nontender with normal range of motion in all extremities. No joint effusions.  No lower extremity tenderness.  No edema. Neurologic: No facial droop, although her right eyelid does seem to be chronically droopy per family on the side where she is blind. Slurred speech. Enunciation is limited by teeth. Normal equal strength in 4 extremities. No sensory deficit. Skin: No skin rash. Warm and dry. No diaphoresis. Psychiatric: No agitation   ____________________________________________  LABS (pertinent positives/negatives)  Labs Reviewed  COMPREHENSIVE METABOLIC PANEL - Abnormal; Notable for the following:       Result Value   Glucose, Bld 156 (*)    Creatinine, Ser 1.12 (*)    GFR calc non Af Amer 41 (*)    GFR calc Af Amer 48 (*)    All other components within normal limits  CBC WITH DIFFERENTIAL/PLATELET  TROPONIN I  URINALYSIS, COMPLETE (UACMP) WITH MICROSCOPIC    ____________________________________________    EKG I, Governor Rooksebecca Jahleah Mariscal, MD, the attending physician have personally viewed and interpreted all ECGs.  71 bpm. Normal  sinus rhythm. LVH. Nonspecific T-wave  Repeat EKG. 62 bpm. normal axis. LVH. Nonspecific T wave.   ____________________________________________  RADIOLOGY All Xrays were viewed by me. Imaging interpreted by Radiologist.  Chest x-ray two-view: No acute cardiopulmonary disease  Thoracic spine 2 view:  IMPRESSION: 1. No fracture or acute finding. 2. Diffuse bony demineralization. Mild degenerative changes along the mid to lower thoracic spine. __________________________________________  PROCEDURES  Procedure(s) performed: None  Critical Care performed: None  ____________________________________________   ED COURSE / ASSESSMENT AND PLAN  Pertinent labs & imaging results that were available during my care of the patient were reviewed by me and considered in my medical decision making (see chart for details).   Ms. Janee Mornhompson was brought in by her daughter after finding out that she's been taking Tylenol more frequent than is recommended on the box.  Nontender abdomen and no nausea. I will check LFTs. And we discussed the importance of adhering to  Tylenol dosing.  From the standpoint of Y she's been taking Tylenol, it's because of shoulder and back discomfort. She thinks it's due to arthritis. She's not had any new trauma. There is no point tenderness over the spine in terms of concern for compression fracture, but I will go ahead and x-ray of spine as well as the chest.  She's not had any particular bony abnormality of the shoulders or shortly use here in the ER.  From the standpoint of the night sweats, I did discuss evaluation for the possibility of some sort of unusual anginal equivalent. Her EKG is nonspecific, repeated due to wavy baseline.  Repeat EKG is reassuring.  Laboratory studies are overall reassuring, no elevated white blood cell count, and again no clinical suspicion right now for infectious process.  GFR somewhat decreased, offered patient by mouth versus IV  rehydration. Daughter is concerned about her. Intake, and I will give her a bag of IV fluids here.  Patient transferred to Dr. Cyril Loosen at shift change 3 PM. Patient is being urinalysis, and I expect likely discharge home.  No change in disposition, may be discharged with appropriate discharge instructions.    CONSULTATIONS:  None  Patient / Family / Caregiver informed of clinical course, medical decision-making process, and agree with plan.   I discussed return precautions, follow-up instructions, and discharge instructions with patient and/or family.   ___________________________________________   FINAL CLINICAL IMPRESSION(S) / ED DIAGNOSES   Final diagnoses:  Chronic bilateral thoracic back pain  Dehydration              Note: This dictation was prepared with Dragon dictation. Any transcriptional errors that result from this process are unintentional    Governor Rooks, MD 03/16/16 [phone]

## 2017-01-18 ENCOUNTER — Observation Stay: Payer: Medicare Other

## 2017-01-18 ENCOUNTER — Inpatient Hospital Stay
Admission: EM | Admit: 2017-01-18 | Discharge: 2017-01-19 | DRG: 066 | Disposition: A | Discharge status: Home Health | Payer: Medicare Other | Attending: Internal Medicine | Admitting: Internal Medicine

## 2017-01-18 ENCOUNTER — Other Ambulatory Visit: Payer: Self-pay

## 2017-01-18 ENCOUNTER — Emergency Department: Payer: Medicare Other

## 2017-01-18 DIAGNOSIS — I10 Essential (primary) hypertension: Secondary | ICD-10-CM

## 2017-01-18 DIAGNOSIS — H5461 Unqualified visual loss, right eye, normal vision left eye: Secondary | ICD-10-CM | POA: Diagnosis not present

## 2017-01-18 DIAGNOSIS — Z96652 Presence of left artificial knee joint: Secondary | ICD-10-CM | POA: Diagnosis not present

## 2017-01-18 DIAGNOSIS — Z7984 Long term (current) use of oral hypoglycemic drugs: Secondary | ICD-10-CM

## 2017-01-18 DIAGNOSIS — R55 Syncope and collapse: Secondary | ICD-10-CM

## 2017-01-18 DIAGNOSIS — Z823 Family history of stroke: Secondary | ICD-10-CM

## 2017-01-18 DIAGNOSIS — F0390 Unspecified dementia without behavioral disturbance: Secondary | ICD-10-CM | POA: Diagnosis not present

## 2017-01-18 DIAGNOSIS — Z87891 Personal history of nicotine dependence: Secondary | ICD-10-CM

## 2017-01-18 DIAGNOSIS — Z79899 Other long term (current) drug therapy: Secondary | ICD-10-CM | POA: Diagnosis not present

## 2017-01-18 DIAGNOSIS — E860 Dehydration: Secondary | ICD-10-CM | POA: Diagnosis present

## 2017-01-18 DIAGNOSIS — I639 Cerebral infarction, unspecified: Secondary | ICD-10-CM | POA: Diagnosis not present

## 2017-01-18 DIAGNOSIS — E785 Hyperlipidemia, unspecified: Secondary | ICD-10-CM | POA: Diagnosis not present

## 2017-01-18 DIAGNOSIS — E119 Type 2 diabetes mellitus without complications: Secondary | ICD-10-CM

## 2017-01-18 DIAGNOSIS — Z7982 Long term (current) use of aspirin: Secondary | ICD-10-CM

## 2017-01-18 DIAGNOSIS — R112 Nausea with vomiting, unspecified: Secondary | ICD-10-CM

## 2017-01-18 DIAGNOSIS — R29700 NIHSS score 0: Secondary | ICD-10-CM | POA: Diagnosis not present

## 2017-01-18 DIAGNOSIS — R197 Diarrhea, unspecified: Secondary | ICD-10-CM

## 2017-01-18 LAB — CBC WITH DIFFERENTIAL/PLATELET
BASOS ABS: 0 10*3/uL (ref 0–0.1)
BASOS PCT: 1 %
EOS ABS: 0 10*3/uL (ref 0–0.7)
Eosinophils Relative: 1 %
HEMATOCRIT: 38.5 % (ref 35.0–47.0)
Hemoglobin: 12.7 g/dL (ref 12.0–16.0)
Lymphocytes Relative: 26 %
Lymphs Abs: 1.3 10*3/uL (ref 1.0–3.6)
MCH: 31.4 pg (ref 26.0–34.0)
MCHC: 33 g/dL (ref 32.0–36.0)
MCV: 95 fL (ref 80.0–100.0)
MONO ABS: 0.3 10*3/uL (ref 0.2–0.9)
Monocytes Relative: 5 %
NEUTROS ABS: 3.4 10*3/uL (ref 1.4–6.5)
NEUTROS PCT: 67 %
Platelets: 189 10*3/uL (ref 150–440)
RBC: 4.05 MIL/uL (ref 3.80–5.20)
RDW: 14 % (ref 11.5–14.5)
WBC: 5 10*3/uL (ref 3.6–11.0)

## 2017-01-18 LAB — GASTROINTESTINAL PANEL BY PCR, STOOL (REPLACES STOOL CULTURE)
ADENOVIRUS F40/41: NOT DETECTED
ASTROVIRUS: NOT DETECTED
CAMPYLOBACTER SPECIES: NOT DETECTED
CYCLOSPORA CAYETANENSIS: NOT DETECTED
Cryptosporidium: NOT DETECTED
ENTEROTOXIGENIC E COLI (ETEC): NOT DETECTED
Entamoeba histolytica: NOT DETECTED
Enteroaggregative E coli (EAEC): NOT DETECTED
Enteropathogenic E coli (EPEC): NOT DETECTED
Giardia lamblia: NOT DETECTED
NOROVIRUS GI/GII: NOT DETECTED
PLESIMONAS SHIGELLOIDES: NOT DETECTED
ROTAVIRUS A: NOT DETECTED
SAPOVIRUS (I, II, IV, AND V): NOT DETECTED
SHIGA LIKE TOXIN PRODUCING E COLI (STEC): NOT DETECTED
Salmonella species: NOT DETECTED
Shigella/Enteroinvasive E coli (EIEC): NOT DETECTED
VIBRIO SPECIES: NOT DETECTED
Vibrio cholerae: NOT DETECTED
Yersinia enterocolitica: NOT DETECTED

## 2017-01-18 LAB — URINALYSIS, COMPLETE (UACMP) WITH MICROSCOPIC
BACTERIA UA: NONE SEEN
BILIRUBIN URINE: NEGATIVE
Glucose, UA: NEGATIVE mg/dL
Hgb urine dipstick: NEGATIVE
KETONES UR: NEGATIVE mg/dL
Leukocytes, UA: NEGATIVE
Nitrite: NEGATIVE
PROTEIN: NEGATIVE mg/dL
Specific Gravity, Urine: 1.011 (ref 1.005–1.030)
pH: 5 (ref 5.0–8.0)

## 2017-01-18 LAB — COMPREHENSIVE METABOLIC PANEL
ALBUMIN: 3.8 g/dL (ref 3.5–5.0)
ALT: 18 U/L (ref 14–54)
AST: 28 U/L (ref 15–41)
Alkaline Phosphatase: 67 U/L (ref 38–126)
Anion gap: 8 (ref 5–15)
BUN: 11 mg/dL (ref 6–20)
CHLORIDE: 106 mmol/L (ref 101–111)
CO2: 27 mmol/L (ref 22–32)
CREATININE: 1.12 mg/dL — AB (ref 0.44–1.00)
Calcium: 9.4 mg/dL (ref 8.9–10.3)
GFR calc Af Amer: 48 mL/min — ABNORMAL LOW (ref >60)
GFR calc non Af Amer: 41 mL/min — ABNORMAL LOW (ref >60)
GLUCOSE: 162 mg/dL — AB (ref 65–99)
POTASSIUM: 4.3 mmol/L (ref 3.5–5.1)
SODIUM: 141 mmol/L (ref 135–145)
Total Bilirubin: 0.7 mg/dL (ref 0.3–1.2)
Total Protein: 7.2 g/dL (ref 6.5–8.1)

## 2017-01-18 LAB — C DIFFICILE QUICK SCREEN W PCR REFLEX
C DIFFICILE (CDIFF) INTERP: NOT DETECTED
C DIFFICILE (CDIFF) TOXIN: NEGATIVE
C DIFFICLE (CDIFF) ANTIGEN: NEGATIVE

## 2017-01-18 LAB — LIPASE, BLOOD: LIPASE: 18 U/L (ref 11–51)

## 2017-01-18 LAB — TROPONIN I
Troponin I: <0.03 ng/mL (ref <0.03)
Troponin I: <0.03 ng/mL (ref <0.03)

## 2017-01-18 LAB — GLUCOSE, CAPILLARY
GLUCOSE-CAPILLARY: 111 mg/dL — AB (ref 65–99)
GLUCOSE-CAPILLARY: 130 mg/dL — AB (ref 65–99)

## 2017-01-18 MED ORDER — CARVEDILOL 12.5 MG PO TABS
12.5000 mg | ORAL_TABLET | Freq: Two times a day (BID) | ORAL | Status: DC
  Administered 2017-01-18 – 2017-01-19 (×2): 12.5 mg via ORAL
  Filled 2017-01-18 (×2): qty 1

## 2017-01-18 MED ORDER — METFORMIN HCL 500 MG PO TABS: 1000.0000 mg | ORAL_TABLET | Freq: Two times a day (BID) | ORAL | Status: DC

## 2017-01-18 MED ORDER — ACETAMINOPHEN 650 MG RE SUPP: 650.0000 mg | Freq: Four times a day (QID) | RECTAL | Status: DC | PRN

## 2017-01-18 MED ORDER — HEPARIN SODIUM (PORCINE) 5000 UNIT/ML IJ SOLN
5000.0000 [IU] | Freq: Three times a day (TID) | INTRAMUSCULAR | Status: DC
  Administered 2017-01-18 – 2017-01-19 (×3): 5000 [IU] via SUBCUTANEOUS
  Filled 2017-01-18 (×4): qty 1

## 2017-01-18 MED ORDER — ONDANSETRON HCL 4 MG PO TABS: 4.0000 mg | ORAL_TABLET | Freq: Four times a day (QID) | ORAL | Status: DC | PRN

## 2017-01-18 MED ORDER — ONDANSETRON HCL 4 MG/2ML IJ SOLN: 4.0000 mg | Freq: Four times a day (QID) | INTRAMUSCULAR | Status: DC | PRN

## 2017-01-18 MED ORDER — LOSARTAN POTASSIUM 25 MG PO TABS
25.0000 mg | ORAL_TABLET | Freq: Every day | ORAL | Status: DC
  Administered 2017-01-18 – 2017-01-19 (×2): 25 mg via ORAL
  Filled 2017-01-18 (×2): qty 1

## 2017-01-18 MED ORDER — ONDANSETRON HCL 4 MG/2ML IJ SOLN: 4.0000 mg | Freq: Once | INTRAMUSCULAR | Status: DC | PRN

## 2017-01-18 MED ORDER — BISACODYL 10 MG RE SUPP: 10.0000 mg | Freq: Every day | RECTAL | Status: DC | PRN

## 2017-01-18 MED ORDER — SODIUM CHLORIDE 0.9 % IV SOLN
INTRAVENOUS | Status: DC
  Administered 2017-01-18 – 2017-01-19 (×2): via INTRAVENOUS

## 2017-01-18 MED ORDER — ACETAMINOPHEN 325 MG PO TABS
650.0000 mg | ORAL_TABLET | Freq: Four times a day (QID) | ORAL | Status: DC | PRN
  Administered 2017-01-18: 650 mg via ORAL
  Filled 2017-01-18: qty 2

## 2017-01-18 MED ORDER — PRAVASTATIN SODIUM 40 MG PO TABS
80.0000 mg | ORAL_TABLET | Freq: Every day | ORAL | Status: DC
  Administered 2017-01-18: 80 mg via ORAL
  Filled 2017-01-18: qty 2

## 2017-01-18 MED ORDER — INSULIN ASPART 100 UNIT/ML ~~LOC~~ SOLN
0.0000 [IU] | Freq: Three times a day (TID) | SUBCUTANEOUS | Status: DC
  Administered 2017-01-19: 1 [IU] via SUBCUTANEOUS
  Filled 2017-01-18: qty 1

## 2017-01-18 MED ORDER — DIPHENOXYLATE-ATROPINE 2.5-0.025 MG PO TABS: 1.0000 | ORAL_TABLET | Freq: Four times a day (QID) | ORAL | Status: DC | PRN

## 2017-01-18 MED ORDER — DONEPEZIL HCL 5 MG PO TABS
10.0000 mg | ORAL_TABLET | Freq: Every day | ORAL | Status: DC
  Administered 2017-01-18: 10 mg via ORAL
  Filled 2017-01-18 (×2): qty 2

## 2017-01-18 NOTE — ED Notes (Signed)
Patient transported to MRI 

## 2017-01-18 NOTE — ED Provider Notes (Signed)
[facility] Emergency Department Provider Note   ____________________________________________   First MD Initiated Contact with Patient 01/18/17 1206     (approximate)  I have reviewed the triage vital signs and the nursing notes.   HISTORY  Chief Complaint Loss of Consciousness and Emesis    HPI Stephanie Graham is a 83 y.o. female Patient was in her usual state of health when she went to church and then slumped over on one of her family members. She had some nausea and vomiting and then diarrhea here in the emergency room. One family member said she had nausea and vomiting for 2 or 3 days now and said she didn't do much sure about this patient herself does not answer that question but tells me she is very anxious about losing one of her life lines. One of her insurance policies canceled but she has 3. Patient is otherwise alert oriented and in no distress.  Past Medical History:  Diagnosis Date  . 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 Diabetes mellitus without complication (HCC)   . Hypertension     There are no active problems to display for this patient.   Past Surgical History:  Procedure Laterality Date  . REPLACEMENT TOTAL KNEE Left     Prior to Admission medications   Medication Sig Start Date End Date Taking? Authorizing Provider  acetaminophen (TYLENOL) 650 MG CR tablet Take 1,300 mg by mouth every 8 (eight) hours as needed for pain.    [provider]  amLODipine (NORVASC) 5 MG tablet Take 5 mg by mouth daily. 01/01/16   [provider]  aspirin EC 81 MG tablet Take 81 mg by mouth daily.    [provider]  carvedilol (COREG) 12.5 MG tablet Take 12.5 mg by mouth 2 (two) times daily. 02/27/16   [provider]  donepezil (ARICEPT) 10 MG tablet Take 10 mg by mouth at bedtime. 02/06/16   [provider]  metFORMIN (GLUCOPHAGE) 500 MG tablet Take 500-1,000 mg by mouth 2  (two) times daily. tk 2 tabs qam and 1 tab qpm 02/08/16   [provider]  pravastatin (PRAVACHOL) 40 MG tablet Take 80 mg by mouth at bedtime. 01/04/16   [provider]    Allergies Patient has no known allergies.  History reviewed. No pertinent family history.  Social History Social History   Tobacco Use  . Smoking status: Never Smoker  . Smokeless tobacco: Former Engineer, waterUser  Substance Use Topics  . Alcohol use: No  . Drug use: No    Review of Systems  Constitutional: No fever/chills Eyes: No visual changesshe is blind in her right eye. ENT: No sore throat. Cardiovascular: Denies chest pain. Respiratory: Denies shortness of breath. Gastrointestinal: No abdominal pain.   nausea,  vomiting. diarrhea.  No constipation. Genitourinary: Negative for dysuria. Musculoskeletal: Negative for back pain. Skin: Negative for rash. Neurological: Negative for headaches, focal weakness  ____________________________________________   PHYSICAL EXAM:  VITAL SIGNS: ED Triage Vitals [01/18/17 1205]  Enc Vitals Group     BP (!) 173/80     Pulse Rate (!) 53     Resp 15     Temp (!) 97.5 F (36.4 C)     Temp Source Oral     SpO2 93 %     Weight 150 lb (68 kg)     Height      Head Circumference      Peak Flow  Pain Score      Pain Loc      Pain Edu?      Excl. in GC?     Constitutional: Alert and oriented. Well appearing and in no acute distress. Eyes: Conjunctivae are normal. and right iris from parents scarring on it Head: Atraumatic. Nose: No congestion/rhinnorhea. Mouth/Throat: Mucous membranes are moist.  Oropharynx non-erythematous. Neck: No stridor.   Cardiovascular: Normal rate, regular rhythm. Grossly normal heart sounds.  Good peripheral circulation. Respiratory: Normal respiratory effort.  No retractions. Lungs CTAB. Gastrointestinal: Soft and nontender. No distention. No abdominal bruits. No CVA tenderness. Musculoskeletal: No lower extremity  tenderness nor edema.  No joint effusions. Neurologic:  Normal speech and language. No gross focal neurologic deficits are appreciated.  Skin:  Skin is warm, dry and intact. No rash noted. Psychiatric: Mood and affect are normal. Speech and behavior are normal.  ____________________________________________   LABS (all labs ordered are listed, but only abnormal results are displayed)  Labs Reviewed  COMPREHENSIVE METABOLIC PANEL - Abnormal; Notable for the following components:      Result Value   Glucose, Bld 162 (*)    Creatinine, Ser 1.12 (*)    GFR calc non Af Amer 41 (*)    GFR calc Af Amer 48 (*)    All other components within normal limits  URINALYSIS, COMPLETE (UACMP) WITH MICROSCOPIC - Abnormal; Notable for the following components:   Color, Urine YELLOW (*)    APPearance CLEAR (*)    Squamous Epithelial / LPF 0-5 (*)    All other components within normal limits  GASTROINTESTINAL PANEL BY PCR, STOOL (REPLACES STOOL CULTURE)  C DIFFICILE QUICK SCREEN W PCR REFLEX  LIPASE, BLOOD  CBC WITH DIFFERENTIAL/PLATELET  TROPONIN I   ____________________________________________  EKG  EKG read and interpreted by me shows sinus bradycardia rate of 55 normal axis no acute ST-T changes ____________________________________________  RADIOLOGY  Dg Chest 2 View  Result Date: 01/18/2017 CLINICAL DATA:  Syncope EXAM: CHEST  2 VIEW COMPARISON:  03/16/2016 chest radiograph. FINDINGS: Stable cardiomediastinal silhouette with mild cardiomegaly and tortuous atherosclerotic thoracic aorta. No pneumothorax. No pleural effusion. Lungs appear clear, with no acute consolidative airspace disease and no pulmonary edema. Surgical clips are seen in the right upper quadrant of the abdomen. IMPRESSION: Stable mild cardiomegaly without pulmonary edema. No active pulmonary disease. Electronically Signed   By: Delbert PhenixJason A Poff M.D.   On: 01/18/2017 13:05   Ct Head Wo Contrast  Result Date:  01/18/2017 CLINICAL DATA:  Syncope. EXAM: CT HEAD WITHOUT CONTRAST TECHNIQUE: Contiguous axial images were obtained from the base of the skull through the vertex without intravenous contrast. COMPARISON:  MRI brain dated December 29, 2011. CT head dated December 28, 2011. FINDINGS: Brain: No evidence of acute infarction, hemorrhage, hydrocephalus, extra-axial collection or mass lesion/mass effect. Stable mild chronic microvascular ischemic white matter disease. Vascular: Atherosclerotic vascular calcification of the carotid siphons. No hyperdense vessel. Skull: Normal. Negative for fracture or focal lesion. Sinuses/Orbits: No acute finding. Other: None. IMPRESSION: 1. No acute intracranial abnormality. Stable mild chronic microvascular ischemic white matter disease. Electronically Signed   By: Obie DredgeWilliam T Derry M.D.   On: 01/18/2017 12:45   CT of the head and chest x-ray showed no acute disease ____________________________________________   PROCEDURES  Procedure(s) performed:   Procedures  Critical Care performed:   ____________________________________________   INITIAL IMPRESSION / ASSESSMENT AND PLAN / ED COURSE  patient had syncope while sitting down in church. She has since had diarrhea here  in the emergency room.she seems to be somewhat unsteady even when she sits up.      ____________________________________________   FINAL CLINICAL IMPRESSION(S) / ED DIAGNOSES  Final diagnoses:  Syncope and collapse  Diarrhea, unspecified type     ED Discharge Orders    None       Note:  This document was prepared using Dragon voice recognition software and may include unintentional dictation errors.    Arnaldo Natal, MD 01/18/17 [phone]

## 2017-01-18 NOTE — ED Triage Notes (Signed)
Pt brought in by [facility] from church with granddaughter.  Pt currently resides with her granddaughter.  Per daughter at scene pt has had n/v x 2-3 days.  Pt had syncopal episode at church while sitting down.  Per family pt passed out.  Per EMS and FD pt was A&Ox4 at scene.  BS 153 at scene.  Pt has hx of diabetes and htn.  Per EMS, IV started 150cc of NS given and 4mg  zofran given IV.

## 2017-01-18 NOTE — H&P (Signed)
History and Physical    Stephanie RotundaCatherine Noyes NFA:[id number] DOB: February 14, 1921 DOA: 01/18/2017  Referring physician: Dr. Darnelle CatalanMalinda PCP: Marisue IvanLinthavong, [place], MD  Specialists: none  Chief Complaint: syncope  HPI: Stephanie RotundaCatherine Graham is a 83 y.o. female has a past medical history significant for HTN and DM now with syncopal episode at church. Has been having some N/V/D over the past 2-3 days. Lives with her grand daughter. Feels well now. No fever. Denies CP or SOB. Labs, EKG, CXR, and head CT non-diagnostic. She is now admitted  Review of Systems: The patient denies anorexia, fever, weight loss,, vision loss, decreased hearing, hoarseness, chest pain, syncope, dyspnea on exertion, peripheral edema, balance deficits, hemoptysis, abdominal pain, melena, hematochezia, severe indigestion/heartburn, hematuria, incontinence, genital sores, muscle weakness, suspicious skin lesions, transient blindness, difficulty walking, depression, unusual weight change, abnormal bleeding, enlarged lymph nodes, angioedema, and breast masses.   Past Medical History:  Diagnosis Date  . 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 Diabetes mellitus without complication (HCC)   . Hypertension    Past Surgical History:  Procedure Laterality Date  . REPLACEMENT TOTAL KNEE Left    Social History:  reports that  has never smoked. She has quit using smokeless tobacco. She reports that she does not drink alcohol or use drugs.  No Known Allergies  History reviewed. No pertinent family history.  Prior to Admission medications   Medication Sig Start Date End Date Taking? Authorizing Provider  acetaminophen (TYLENOL) 650 MG CR tablet Take 1,300 mg by mouth every 8 (eight) hours as needed for pain.   Yes [provider]  carvedilol (COREG) 12.5 MG tablet Take 12.5 mg by mouth 2 (two) times daily. 02/27/16  Yes [provider]  donepezil (ARICEPT) 10 MG tablet Take 10 mg by mouth at  bedtime. 02/06/16  Yes [provider]  losartan (COZAAR) 25 MG tablet Take 25 mg by mouth daily.   Yes [provider]  metFORMIN (GLUCOPHAGE) 500 MG tablet Take 1,000 mg by mouth 2 (two) times daily.  02/08/16  Yes [provider]  pravastatin (PRAVACHOL) 40 MG tablet Take 80 mg by mouth at bedtime. 01/04/16  Yes [provider]   Physical Exam: Vitals:   01/18/17 1205 01/18/17 1239 01/18/17 1353  BP: (!) 173/80 (!) 143/83   Pulse: (!) 53 (!) 58 61  Resp: 15 13 19   Temp: (!) 97.5 F (36.4 C)    TempSrc: Oral    SpO2: 93% 94% 97%  Weight: 68 kg (150 lb)       General:  No apparent distress, WDWN, [place]/AT  Eyes: PERRL, EOMI, no scleral icterus, conjunctiva clear  ENT: moist oropharynx without exudate, TM's benign, dentition fair  Neck: supple, no lymphadenopathy. No bruits or thyromegaly  Cardiovascular: regular rate without MRG; 2+ peripheral pulses, no JVD, no peripheral edema  Respiratory: CTA biL, good air movement without wheezing, rhonchi or crackled. Respiratory effort normal  Abdomen: soft, non tender to palpation, positive bowel sounds, no guarding, no rebound  Skin: no rashes or lesions  Musculoskeletal: normal bulk and tone, no joint swelling  Psychiatric: normal mood and affect, A&OX3  Neurologic: CN 2-12 grossly intact, Motor strength 5/5 in all 4 groups with symmetric DTR's and non-focal sensory exam  Labs on Admission:  Basic Metabolic Panel: Recent Labs  Lab 01/18/17 1202  NA 141  K 4.3  CL 106  CO2 27  GLUCOSE 162*  BUN 11  CREATININE 1.12*  CALCIUM 9.4  Liver Function Tests: Recent Labs  Lab 01/18/17 1202  AST 28  ALT 18  ALKPHOS 67  BILITOT 0.7  PROT 7.2  ALBUMIN 3.8   Recent Labs  Lab 01/18/17 1202  LIPASE 18   No results for input(s): AMMONIA in the last 168 hours. CBC: Recent Labs  Lab 01/18/17 1202  WBC 5.0  NEUTROABS 3.4  HGB 12.7  HCT 38.5  MCV 95.0  PLT 189   Cardiac  Enzymes: Recent Labs  Lab 01/18/17 1202  TROPONINI <0.03    BNP (last 3 results) No results for input(s): BNP in the last 8760 hours.  ProBNP (last 3 results) No results for input(s): PROBNP in the last 8760 hours.  CBG: No results for input(s): GLUCAP in the last 168 hours.  Radiological Exams on Admission: Dg Chest 2 View  Result Date: 01/18/2017 CLINICAL DATA:  Syncope EXAM: CHEST  2 VIEW COMPARISON:  03/16/2016 chest radiograph. FINDINGS: Stable cardiomediastinal silhouette with mild cardiomegaly and tortuous atherosclerotic thoracic aorta. No pneumothorax. No pleural effusion. Lungs appear clear, with no acute consolidative airspace disease and no pulmonary edema. Surgical clips are seen in the right upper quadrant of the abdomen. IMPRESSION: Stable mild cardiomegaly without pulmonary edema. No active pulmonary disease. Electronically Signed   By: Delbert PhenixJason A Poff M.D.   On: 01/18/2017 13:05   Ct Head Wo Contrast  Result Date: 01/18/2017 CLINICAL DATA:  Syncope. EXAM: CT HEAD WITHOUT CONTRAST TECHNIQUE: Contiguous axial images were obtained from the base of the skull through the vertex without intravenous contrast. COMPARISON:  MRI brain dated December 29, 2011. CT head dated December 28, 2011. FINDINGS: Brain: No evidence of acute infarction, hemorrhage, hydrocephalus, extra-axial collection or mass lesion/mass effect. Stable mild chronic microvascular ischemic white matter disease. Vascular: Atherosclerotic vascular calcification of the carotid siphons. No hyperdense vessel. Skull: Normal. Negative for fracture or focal lesion. Sinuses/Orbits: No acute finding. Other: None. IMPRESSION: 1. No acute intracranial abnormality. Stable mild chronic microvascular ischemic white matter disease. Electronically Signed   By: Obie DredgeWilliam T Derry M.D.   On: 01/18/2017 12:45    EKG: Independently reviewed.  Assessment/Plan Principal Problem:   Syncope Active Problems:   HTN (hypertension)    Diabetes (HCC)   Nausea vomiting and diarrhea   Will observe on floor with telemetry. Begin IV fluids and use prn meds for N/V/D. Will check MRI of brain and echo and carotids because of syncope. Consult PT and CM. Repeat labs in AM. Follow sugars.  Diet: clear liquid Fluids: NS@75  DVT Prophylaxis: SQ Heparin  Code Status: FULL  Family Communication: yes  Disposition Plan: home  Time spent: 50 min

## 2017-01-19 ENCOUNTER — Inpatient Hospital Stay
Admit: 2017-01-19 | Discharge: 2017-01-19 | Disposition: A | Discharge status: Home / Self Care | Payer: Medicare Other | Attending: Internal Medicine | Admitting: Internal Medicine

## 2017-01-19 ENCOUNTER — Inpatient Hospital Stay (HOSPITAL_COMMUNITY): Payer: Medicare Other

## 2017-01-19 DIAGNOSIS — E785 Hyperlipidemia, unspecified: Secondary | ICD-10-CM | POA: Diagnosis not present

## 2017-01-19 DIAGNOSIS — Z7984 Long term (current) use of oral hypoglycemic drugs: Secondary | ICD-10-CM | POA: Diagnosis not present

## 2017-01-19 DIAGNOSIS — F0390 Unspecified dementia without behavioral disturbance: Secondary | ICD-10-CM | POA: Diagnosis not present

## 2017-01-19 DIAGNOSIS — H5461 Unqualified visual loss, right eye, normal vision left eye: Secondary | ICD-10-CM | POA: Diagnosis not present

## 2017-01-19 DIAGNOSIS — Z87891 Personal history of nicotine dependence: Secondary | ICD-10-CM | POA: Diagnosis not present

## 2017-01-19 DIAGNOSIS — R55 Syncope and collapse: Secondary | ICD-10-CM | POA: Diagnosis present

## 2017-01-19 DIAGNOSIS — I10 Essential (primary) hypertension: Secondary | ICD-10-CM | POA: Diagnosis not present

## 2017-01-19 DIAGNOSIS — E119 Type 2 diabetes mellitus without complications: Secondary | ICD-10-CM | POA: Diagnosis not present

## 2017-01-19 DIAGNOSIS — I639 Cerebral infarction, unspecified: Secondary | ICD-10-CM | POA: Diagnosis not present

## 2017-01-19 DIAGNOSIS — Z7982 Long term (current) use of aspirin: Secondary | ICD-10-CM | POA: Diagnosis not present

## 2017-01-19 DIAGNOSIS — Z79899 Other long term (current) drug therapy: Secondary | ICD-10-CM | POA: Diagnosis not present

## 2017-01-19 DIAGNOSIS — Z96652 Presence of left artificial knee joint: Secondary | ICD-10-CM | POA: Diagnosis not present

## 2017-01-19 DIAGNOSIS — E860 Dehydration: Secondary | ICD-10-CM | POA: Diagnosis not present

## 2017-01-19 DIAGNOSIS — R29700 NIHSS score 0: Secondary | ICD-10-CM | POA: Diagnosis not present

## 2017-01-19 DIAGNOSIS — Z823 Family history of stroke: Secondary | ICD-10-CM | POA: Diagnosis not present

## 2017-01-19 LAB — ECHOCARDIOGRAM COMPLETE
AV Area VTI: 2.11 cm2
AV Peak grad: 8 mmHg
AV peak Index: 1.33
AV pk vel: 140 cm/s
Ao pk vel: 0.67 m/s
E/e' ratio: 9.07
EWDT: 257 ms
FS: 37 % (ref 28–44)
HEIGHTINCHES: 59.764 in
IVS/LV PW RATIO, ED: 1.67
LA diam end sys: 49 mm
LA diam index: 3.08 cm/m2
LA vol index: 45.3 mL/m2
LASIZE: 49 mm
LAVOL: 72.2 mL
LAVOLA4C: 54.4 mL
LDCA: 3.14 cm2
LV E/e' medial: 9.07
LV E/e'average: 9.07
LV PW d: 11.2 mm — AB (ref 0.6–1.1)
LV TDI E'LATERAL: 9.14
LV TDI E'MEDIAL: 3.81
LV e' LATERAL: 9.14 cm/s
LVOT diameter: 20 mm
LVOT peak vel: 94.1 cm/s
MV Dec: 257
MVAP: 2.93 cm2
MVPG: 3 mmHg
MVPKAVEL: 98.5 m/s
MVPKEVEL: 82.9 m/s
MVSPHT: 75 ms
WEIGHTICAEL: 2081.6 [oz_av]

## 2017-01-19 LAB — COMPREHENSIVE METABOLIC PANEL
ALBUMIN: 3.2 g/dL — AB (ref 3.5–5.0)
ALT: 15 U/L (ref 14–54)
AST: 17 U/L (ref 15–41)
Alkaline Phosphatase: 57 U/L (ref 38–126)
Anion gap: 8 (ref 5–15)
BUN: 11 mg/dL (ref 6–20)
CHLORIDE: 105 mmol/L (ref 101–111)
CO2: 29 mmol/L (ref 22–32)
Calcium: 8.5 mg/dL — ABNORMAL LOW (ref 8.9–10.3)
Creatinine, Ser: 0.9 mg/dL (ref 0.44–1.00)
GFR calc Af Amer: >60 mL/min (ref >60)
GFR calc non Af Amer: 53 mL/min — ABNORMAL LOW (ref >60)
GLUCOSE: 106 mg/dL — AB (ref 65–99)
POTASSIUM: 3.7 mmol/L (ref 3.5–5.1)
SODIUM: 142 mmol/L (ref 135–145)
Total Bilirubin: 1.1 mg/dL (ref 0.3–1.2)
Total Protein: 5.9 g/dL — ABNORMAL LOW (ref 6.5–8.1)

## 2017-01-19 LAB — HEMOGLOBIN A1C
HEMOGLOBIN A1C: 5.9 % — AB (ref 4.8–5.6)
MEAN PLASMA GLUCOSE: 122.63 mg/dL

## 2017-01-19 LAB — CBC
HCT: 34.8 % — ABNORMAL LOW (ref 35.0–47.0)
Hemoglobin: 11.5 g/dL — ABNORMAL LOW (ref 12.0–16.0)
MCH: 31.3 pg (ref 26.0–34.0)
MCHC: 33 g/dL (ref 32.0–36.0)
MCV: 94.7 fL (ref 80.0–100.0)
PLATELETS: 164 10*3/uL (ref 150–440)
RBC: 3.67 MIL/uL — AB (ref 3.80–5.20)
RDW: 13.8 % (ref 11.5–14.5)
WBC: 5.4 10*3/uL (ref 3.6–11.0)

## 2017-01-19 LAB — GLUCOSE, CAPILLARY
GLUCOSE-CAPILLARY: 104 mg/dL — AB (ref 65–99)
GLUCOSE-CAPILLARY: 129 mg/dL — AB (ref 65–99)
GLUCOSE-CAPILLARY: 83 mg/dL (ref 65–99)

## 2017-01-19 LAB — LIPID PANEL
CHOLESTEROL: 156 mg/dL (ref 0–200)
HDL: 67 mg/dL (ref >40)
LDL Cholesterol: 56 mg/dL (ref 0–99)
TRIGLYCERIDES: 163 mg/dL — AB (ref <150)
Total CHOL/HDL Ratio: 2.3 RATIO
VLDL: 33 mg/dL (ref 0–40)

## 2017-01-19 LAB — TROPONIN I

## 2017-01-19 MED ORDER — ASPIRIN 325 MG PO TABS: 325.0000 mg | ORAL_TABLET | Freq: Every day | ORAL | 1 refills | Status: DC

## 2017-01-19 MED ORDER — ASPIRIN 325 MG PO TABS: 325.0000 mg | ORAL_TABLET | Freq: Every day | ORAL | Status: DC

## 2017-01-19 MED ORDER — ASPIRIN EC 81 MG PO TBEC
81.0000 mg | DELAYED_RELEASE_TABLET | Freq: Every day | ORAL | Status: DC
  Administered 2017-01-19: 81 mg via ORAL
  Filled 2017-01-19: qty 1

## 2017-01-19 NOTE — Progress Notes (Signed)
*  PRELIMINARY RESULTS* Echocardiogram 2D Echocardiogram has been performed.  Stephanie Graham, Stephanie Graham 01/19/2017, 1:46 PM

## 2017-01-19 NOTE — Consult Note (Signed)
Referring Physician: Elisabeth Pigeon    Chief Complaint: Syncope  HPI: Stephanie Graham is an 83 y.o. female presenting after a syncopal event at church on yesterday.  Patient reports that she had no premonitory symptoms but just remembers awakening afterward.  Reports she is at baseline now.   Had nausea and vomiting for the past 2-3 days prior to presentation.  Initial NIHSS of 0.    Date last known well: Date: 01/18/2017 Time last known well: Time: 11:00 tPA Given: No: Resolving symptoms  Past Medical History:  Diagnosis Date  . 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 Diabetes mellitus without complication (HCC)   . Hypertension   Dementia  Past Surgical History:  Procedure Laterality Date  . REPLACEMENT TOTAL KNEE Left     Family history: 2 siblings alive and well.  Mother deceased with a stroke   Social History:  reports that  has never smoked. She has quit using smokeless tobacco. She reports that she does not drink alcohol or use drugs.  Allergies: No Known Allergies  Medications:  I have reviewed the patient's current medications. Prior to Admission:  Medications Prior to Admission  Medication Sig Dispense Refill Last Dose  . acetaminophen (TYLENOL) 650 MG CR tablet Take 1,300 mg by mouth every 8 (eight) hours as needed for pain.   PRN at PRN  . carvedilol (COREG) 12.5 MG tablet Take 12.5 mg by mouth 2 (two) times daily.  0 N/A at N/A  . donepezil (ARICEPT) 10 MG tablet Take 10 mg by mouth at bedtime.  0 N/A at N/A  . losartan (COZAAR) 25 MG tablet Take 25 mg by mouth daily.   N/A at N/A  . metFORMIN (GLUCOPHAGE) 500 MG tablet Take 1,000 mg by mouth 2 (two) times daily.   0 N/A at N/A  . pravastatin (PRAVACHOL) 40 MG tablet Take 80 mg by mouth at bedtime.  0 01/17/2017 at HS   Scheduled: . aspirin EC  81 mg Oral Daily  . carvedilol  12.5 mg Oral BID WC  . donepezil  10 mg Oral QHS  . heparin  5,000 Units Subcutaneous Q8H  . insulin aspart   0-9 Units Subcutaneous TID WC  . losartan  25 mg Oral Daily  . pravastatin  80 mg Oral QHS    ROS: History obtained from the patient  General ROS: negative for - chills, fatigue, fever, night sweats, weight gain or weight loss Psychological ROS: negative for - behavioral disorder, hallucinations, memory difficulties, mood swings or suicidal ideation Ophthalmic ROS: blind right eye ENT ROS: HOH Allergy and Immunology ROS: negative for - hives or itchy/watery eyes Hematological and Lymphatic ROS: negative for - bleeding problems, bruising or swollen lymph nodes Endocrine ROS: negative for - galactorrhea, hair pattern changes, polydipsia/polyuria or temperature intolerance Respiratory ROS: negative for - cough, hemoptysis, shortness of breath or wheezing Cardiovascular ROS: negative for - chest pain, dyspnea on exertion, edema or irregular heartbeat Gastrointestinal ROS: negative for - abdominal pain, diarrhea, hematemesis, nausea/vomiting or stool incontinence Genito-Urinary ROS: negative for - dysuria, hematuria, incontinence or urinary frequency/urgency Musculoskeletal ROS: negative for - joint swelling or muscular weakness Neurological ROS: as noted in HPI Dermatological ROS: negative for rash and skin lesion changes  Physical Examination: Blood pressure (!) 162/58, pulse (!) 55, temperature 98.6 F (37 C), temperature source Oral, resp. rate 18, height 4' 11.76" (1.518 m), weight 59 kg (130 lb 1.6 oz), SpO2 97 %.  HEENT-  Normocephalic, no  lesions, without obvious abnormality.  Normal external eye and conjunctiva.  Normal TM's bilaterally.  Normal auditory canals and external ears. Normal external nose, mucus membranes and septum.  Normal pharynx. Cardiovascular- S1, S2 normal, pulses palpable throughout   Lungs- chest clear, no wheezing, rales, normal symmetric air entry Abdomen- soft, non-tender; bowel sounds normal; no masses,  no organomegaly Extremities- no edema Lymph-no  adenopathy palpable Musculoskeletal-no joint tenderness, deformity or swelling Skin-warm and dry, no hyperpigmentation, vitiligo, or suspicious lesions  Neurological Examination   Mental Status: Alert, oriented to name and place, thought content appropriate.  Speech fluent without evidence of aphasia.  Able to follow 3 step commands without difficulty. Cranial Nerves: II: Discs flat on the left; Visual fields grossly normal, left pupil reactive III,IV, VI: ptosis not present, extra-ocular motions intact bilaterally V,VII: smile symmetric, facial light touch sensation normal bilaterally VIII: HOH IX,X: gag reflex present XI: bilateral shoulder shrug XII: midline tongue extension Motor: Right : Upper extremity   5/5    Left:     Upper extremity   5/5  Lower extremity   5/5     Lower extremity   5/5 Tone and bulk:normal tone throughout; no atrophy noted Sensory: Pinprick and light touch intact throughout, bilaterally Deep Tendon Reflexes: 2+ in the upper extremities, 1+ at the left KJ.  DTR's absent otherwise Plantars: Right: upgoing   Left: upgoing Cerebellar: Unable to perform finger-to-nose and heel-to-shin testing due to ability to follow commands.   Gait: uses walker with narrow based gait    Laboratory Studies:  Basic Metabolic Panel: Recent Labs  Lab 01/18/17 1202 01/19/17 0453  NA 141 142  K 4.3 3.7  CL 106 105  CO2 27 29  GLUCOSE 162* 106*  BUN 11 11  CREATININE 1.12* 0.90  CALCIUM 9.4 8.5*    Liver Function Tests: Recent Labs  Lab 01/18/17 1202 01/19/17 0453  AST 28 17  ALT 18 15  ALKPHOS 67 57  BILITOT 0.7 1.1  PROT 7.2 5.9*  ALBUMIN 3.8 3.2*   Recent Labs  Lab 01/18/17 1202  LIPASE 18   No results for input(s): AMMONIA in the last 168 hours.  CBC: Recent Labs  Lab 01/18/17 1202 01/19/17 0453  WBC 5.0 5.4  NEUTROABS 3.4  --   HGB 12.7 11.5*  HCT 38.5 34.8*  MCV 95.0 94.7  PLT 189 164    Cardiac Enzymes: Recent Labs  Lab  01/18/17 1202 01/18/17 1722 01/18/17 2249 01/19/17 0453  TROPONINI <0.03 <0.03 <0.03 <0.03    BNP: Invalid input(s): POCBNP  CBG: Recent Labs  Lab 01/18/17 1652 01/18/17 2101 01/19/17 0846  GLUCAP 111* 130* 104*    Microbiology: Results for orders placed or performed during the hospital encounter of 01/18/17  Gastrointestinal Panel by PCR , Stool     Status: None   Collection Time: 01/18/17 12:13 PM  Result Value Ref Range Status   Campylobacter species NOT DETECTED NOT DETECTED Final   Plesimonas shigelloides NOT DETECTED NOT DETECTED Final   Salmonella species NOT DETECTED NOT DETECTED Final   Yersinia enterocolitica NOT DETECTED NOT DETECTED Final   Vibrio species NOT DETECTED NOT DETECTED Final   Vibrio cholerae NOT DETECTED NOT DETECTED Final   Enteroaggregative E coli (EAEC) NOT DETECTED NOT DETECTED Final   Enteropathogenic E coli (EPEC) NOT DETECTED NOT DETECTED Final   Enterotoxigenic E coli (ETEC) NOT DETECTED NOT DETECTED Final   Shiga like toxin producing E coli (STEC) NOT DETECTED NOT DETECTED Final  Shigella/Enteroinvasive E coli (EIEC) NOT DETECTED NOT DETECTED Final   Cryptosporidium NOT DETECTED NOT DETECTED Final   Cyclospora cayetanensis NOT DETECTED NOT DETECTED Final   Entamoeba histolytica NOT DETECTED NOT DETECTED Final   Giardia lamblia NOT DETECTED NOT DETECTED Final   Adenovirus F40/41 NOT DETECTED NOT DETECTED Final   Astrovirus NOT DETECTED NOT DETECTED Final   Norovirus GI/GII NOT DETECTED NOT DETECTED Final   Rotavirus A NOT DETECTED NOT DETECTED Final   Sapovirus (I, II, IV, and V) NOT DETECTED NOT DETECTED Final    Comment: Performed at [facility], [address] [place], [place] [address]  C difficile quick scan w PCR reflex     Status: None   Collection Time: 01/18/17 12:13 PM  Result Value Ref Range Status   C Diff antigen NEGATIVE NEGATIVE Final   C Diff toxin NEGATIVE NEGATIVE Final   C Diff interpretation No C.  difficile detected.  Final    Comment: Performed at [facility], [address] [place], [place] [address]    Coagulation Studies: No results for input(s): LABPROT, INR in the last 72 hours.  Urinalysis:  Recent Labs  Lab 01/18/17 1213  COLORURINE YELLOW*  LABSPEC 1.011  PHURINE 5.0  GLUCOSEU NEGATIVE  HGBUR NEGATIVE  BILIRUBINUR NEGATIVE  KETONESUR NEGATIVE  PROTEINUR NEGATIVE  NITRITE NEGATIVE  LEUKOCYTESUR NEGATIVE    Lipid Panel:    Component Value Date/Time   CHOL 156 01/19/2017 0453   CHOL 203 (H) 12/28/2011 1916   TRIG 163 (H) 01/19/2017 0453   TRIG 154 12/28/2011 1916   HDL 67 01/19/2017 0453   HDL 85 (H) 12/28/2011 1916   CHOLHDL 2.3 01/19/2017 0453   VLDL 33 01/19/2017 0453   VLDL 31 12/28/2011 1916   LDLCALC 56 01/19/2017 0453   LDLCALC 87 12/28/2011 1916    HgbA1C:  Lab Results  Component Value Date   HGBA1C 6.7 (H) 12/28/2011    Urine Drug Screen:  No results found for: LABOPIA, COCAINSCRNUR, LABBENZ, AMPHETMU, THCU, LABBARB  Alcohol Level: No results for input(s): ETH in the last 168 hours.  Other results: EKG: sinus rhythm at 55 bpm.  Imaging: Dg Chest 2 View  Result Date: 01/18/2017 CLINICAL DATA:  Syncope EXAM: CHEST  2 VIEW COMPARISON:  03/16/2016 chest radiograph. FINDINGS: Stable cardiomediastinal silhouette with mild cardiomegaly and tortuous atherosclerotic thoracic aorta. No pneumothorax. No pleural effusion. Lungs appear clear, with no acute consolidative airspace disease and no pulmonary edema. Surgical clips are seen in the right upper quadrant of the abdomen. IMPRESSION: Stable mild cardiomegaly without pulmonary edema. No active pulmonary disease. Electronically Signed   By: Delbert Phenix M.D.   On: 01/18/2017 13:05   Ct Head Wo Contrast  Result Date: 01/18/2017 CLINICAL DATA:  Syncope. EXAM: CT HEAD WITHOUT CONTRAST TECHNIQUE: Contiguous axial images were obtained from the base of the skull through the vertex without  intravenous contrast. COMPARISON:  MRI brain dated December 29, 2011. CT head dated December 28, 2011. FINDINGS: Brain: No evidence of acute infarction, hemorrhage, hydrocephalus, extra-axial collection or mass lesion/mass effect. Stable mild chronic microvascular ischemic white matter disease. Vascular: Atherosclerotic vascular calcification of the carotid siphons. No hyperdense vessel. Skull: Normal. Negative for fracture or focal lesion. Sinuses/Orbits: No acute finding. Other: None. IMPRESSION: 1. No acute intracranial abnormality. Stable mild chronic microvascular ischemic white matter disease. Electronically Signed   By: Obie Dredge M.D.   On: 01/18/2017 12:45   Mr Brain Wo Contrast  Result Date: 01/18/2017 CLINICAL  DATA:  Syncope recurrent EXAM: MRI HEAD WITHOUT CONTRAST TECHNIQUE: Multiplanar, multiecho pulse sequences of the brain and surrounding structures were obtained without intravenous contrast. COMPARISON:  CT 01/18/2017, MRI 12/29/2011 FINDINGS: Brain: Ventricle size and cerebral volume normal for age. Negative for hydrocephalus 5 mm area of restricted diffusion left medial frontal lobe most compatible with acute infarct. No other acute infarct. Moderate chronic microvascular ischemic changes in the white matter and pons. Negative for hemorrhage or mass. Vascular: Normal arterial flow voids Skull and upper cervical spine: No skull lesion. Spondylosis and spinal stenosis at C3-4 Sinuses/Orbits: Mucosal edema in the paranasal sinuses with bony thickening of the maxillary sinus bilaterally. Normal orbit Other: None IMPRESSION: 5 mm acute infarct left medial frontal lobe Moderate chronic microvascular ischemic change in the white matter and brainstem Spondylosis and spinal stenosis at C3-4. Electronically Signed   By: Marlan Palauharles  Clark M.D.   On: 01/18/2017 16:35   [place] Carotid Bilateral  Result Date: 01/18/2017 CLINICAL DATA:  83 year old female with a history of syncope EXAM: BILATERAL CAROTID  DUPLEX ULTRASOUND TECHNIQUE: Wallace CullensGray scale imaging, color Doppler and duplex ultrasound were performed of bilateral carotid and vertebral arteries in the neck. COMPARISON:  12/29/2011 FINDINGS: Criteria: Quantification of carotid stenosis is based on velocity parameters that correlate the residual internal carotid diameter with NASCET-based stenosis levels, using the diameter of the distal internal carotid lumen as the denominator for stenosis measurement. The following velocity measurements were obtained: RIGHT ICA:  Systolic 75 cm/sec, Diastolic 21 cm/sec CCA:  54 cm/sec SYSTOLIC ICA/CCA RATIO:  1.4 ECA:  48 cm/sec LEFT ICA:  Systolic 118 cm/sec, Diastolic 25 cm/sec CCA:  54 cm/sec SYSTOLIC ICA/CCA RATIO:  2.2 ECA:  50 cm/sec Right Brachial SBP: Not acquired Left Brachial SBP: Not acquired RIGHT CAROTID ARTERY: No significant calcified disease of the right common carotid artery. Intermediate waveform maintained. Heterogeneous plaque without significant calcifications at the right carotid bifurcation. Low resistance waveform of the right ICA. No significant tortuosity. RIGHT VERTEBRAL ARTERY: Antegrade flow with low resistance waveform. LEFT CAROTID ARTERY: No significant calcified disease of the left common carotid artery. Intermediate waveform maintained. Heterogeneous plaque at the left carotid bifurcation without significant calcifications. Low resistance waveform of the left ICA. LEFT VERTEBRAL ARTERY:  Antegrade flow with low resistance waveform. IMPRESSION: Color duplex indicates minimal heterogeneous plaque, with no hemodynamically significant stenosis by duplex criteria in the extracranial cerebrovascular circulation. Signed, Yvone NeuJaime S. Loreta AveWagner, DO [facility] Electronically Signed   By: Gilmer MorJaime  Wagner D.O.   On: 01/18/2017 15:51    Assessment: 83 y.o. female presenting after a syncopal episode.  MRI of the brain reviewed and shows a small, left medial  frontal infarct.  Etiology likely embolic.  Patient on no antiplatelet therapy prior to admission.  Carotid dopplers show no evidence of hemodynamically significant stenosis.  Echocardiogram pending.    Stroke Risk Factors - diabetes mellitus, hyperlipidemia and hypertension  Plan: 1. HgbA1c, fasting lipid panel 2. PT consult, OT consult, Speech consult 3. Echocardiogram pending 4. Prophylactic therapy-Antiplatelet med: Aspirin - dose 325mg  daily 5. Telemetry monitoring 6. Frequent neuro checks 7. If echocardiogram unremarkable would recommend prolonged cardiac monitoring as an outpatient  8. EEG.  May be performed as an outpatient    Thana FarrLeslie Abrey Bradway, MD Neurology [phone] 01/19/2017, 11:52 AM

## 2017-01-19 NOTE — Care Management Note (Signed)
Case Management Note  Patient Details  Name: Stephanie RotundaCatherine Graham MRN: [id number] Date of Birth: [birth year]  Subjective/Objective:                 Patient lives with her granddaughter for the past 2 months.  she has access to a walker and cane.  Current with her PCP. Denies she has issues obtaining her medications, or with transportation getting to MD appointments.  She is agreeable to home health and no agency preference.  Placed in observation for syncope and in less than 24 hours from observation order admitted as patient ruled in for CVA.  Action/Plan:  [facility]erral to [facility] for [facility] PT OT  Expected Discharge Date:                  Expected Discharge Plan:     In-House Referral:     Discharge planning Services     Post Acute Care Choice:    Choice offered to:     DME Arranged:    DME Agency:     HH Arranged:    HH Agency:     Status of Service:     If discussed at [employer] Meetings, dates discussed:    Additional Comments:  Eber HongGreene, Suhey Radford R, RN 01/19/2017, 12:09 PM

## 2017-01-19 NOTE — Progress Notes (Signed)
OT Cancellation Note  Patient Details Name: Stephanie Graham MRN: [id number] DOB: 1921-07-09   Cancelled Treatment:    Reason Eval/Treat Not Completed: Patient at procedure or test/ unavailable. Order received, chart reviewed. Pt unavailable for evaluation due to testing (EEG). Will re-attempt OT evaluation at later date/time as pt is available and medically appropriate.  Richrd PrimeJamie Stiller, MPH, MS, OTR/L ascom [phone] 01/19/17, 1:49 PM

## 2017-01-19 NOTE — Care Management Obs Status (Signed)
MEDICARE OBSERVATION STATUS NOTIFICATION   Patient Details  Name: Stephanie Graham MRN: [id number] Date of Birth: 1921-12-15   Medicare Observation Status Notification Given:  No Admitted in less than  24hr of being placed on observation   Eber HongGreene, Malaisha Silliman R, RN 01/19/2017, 11:04 AM

## 2017-01-19 NOTE — Discharge Summary (Signed)
[facility] Physicians - [place] at [facility]   PATIENT NAME: Stephanie Graham    MR#:  [id number]  DATE OF BIRTH:  1921-10-25  DATE OF ADMISSION:  01/18/2017 ADMITTING PHYSICIAN: Marguarite Arbour, MD  DATE OF DISCHARGE: 01/19/2017   PRIMARY CARE PHYSICIAN: Marisue Ivan, MD    ADMISSION DIAGNOSIS:  Syncope and collapse [R55] Syncope [R55] Diarrhea, unspecified type [R19.7]  DISCHARGE DIAGNOSIS:  Principal Problem:   Syncope Active Problems:   HTN (hypertension)   Diabetes (HCC)   Nausea vomiting and diarrhea   Stroke (cerebrum) (HCC)   SECONDARY DIAGNOSIS:   Past Medical History:  Diagnosis Date  . 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 Diabetes mellitus without complication (HCC)   . Hypertension     HOSPITAL COURSE:   Came with a syncopal spisode- found to have acute stroke on MRI Echo and carotid doppler done, no significant findings. SLP eval passed by nurse.  PT saw her- suggest need for HHA , PT.  Neuro suggest to have EEG as out pt  Also suggest to have cardiac monitor as out pt by neurologist- referred to her cardiologist for that.   DISCHARGE CONDITIONS:  Stable.  CONSULTS OBTAINED:  Treatment Team:  Thana Farr, MD Dorothyann Peng D, MD  DRUG ALLERGIES:  No Known Allergies  DISCHARGE MEDICATIONS:   Allergies as of 01/19/2017   No Known Allergies     Medication List    TAKE these medications   acetaminophen 650 MG CR tablet Commonly known as:  TYLENOL Take 1,300 mg by mouth every 8 (eight) hours as needed for pain.   aspirin 325 MG tablet Take 1 tablet (325 mg total) by mouth daily. Start taking on:  01/20/2017   carvedilol 12.5 MG tablet Commonly known as:  COREG Take 12.5 mg by mouth 2 (two) times daily.   donepezil 10 MG tablet Commonly known as:  ARICEPT Take 10 mg by mouth at bedtime.   losartan 25 MG tablet Commonly known as:  COZAAR Take 25 mg by mouth daily.    metFORMIN 500 MG tablet Commonly known as:  GLUCOPHAGE Take 1,000 mg by mouth 2 (two) times daily.   pravastatin 40 MG tablet Commonly known as:  PRAVACHOL Take 80 mg by mouth at bedtime.        DISCHARGE INSTRUCTIONS:    Follow with PMD and cardiologist in 1-2 weeks.  If you experience worsening of your admission symptoms, develop shortness of breath, life threatening emergency, suicidal or homicidal thoughts you must seek medical attention immediately by calling 911 or calling your MD immediately  if symptoms less severe.  You Must read complete instructions/literature along with all the possible adverse reactions/side effects for all the Medicines you take and that have been prescribed to you. Take any new Medicines after you have completely understood and accept all the possible adverse reactions/side effects.   Please note  You were cared for by a hospitalist during your hospital stay. If you have any questions about your discharge medications or the care you received while you were in the hospital after you are discharged, you can call the unit and asked to speak with the hospitalist on call if the hospitalist that took care of you is not available. Once you are discharged, your primary care physician will handle any further medical issues. Please note that NO REFILLS for any discharge medications will be authorized once you are discharged, as it is imperative that you  return to your primary care physician (or establish a relationship with a primary care physician if you do not have one) for your aftercare needs so that they can reassess your need for medications and monitor your lab values.    Today   CHIEF COMPLAINT:   Chief Complaint  Patient presents with  . Loss of Consciousness  . Emesis    HISTORY OF PRESENT ILLNESS:  Stephanie Graham  is a 83 y.o. female with a known history of HTN and DM now with syncopal episode at church. Has been having some N/V/D over the  past 2-3 days. Lives with her grand daughter. Feels well now. No fever. Denies CP or SOB. Labs, EKG, CXR, and head CT non-diagnostic. She is now admitted   VITAL SIGNS:  Blood pressure (!) 162/58, pulse (!) 55, temperature 98.6 F (37 C), temperature source Oral, resp. rate 18, height 4' 11.76" (1.518 m), weight 59 kg (130 lb 1.6 oz), SpO2 97 %.  I/O:    Intake/Output Summary (Last 24 hours) at 01/19/2017 1507 Last data filed at 01/19/2017 1354 Gross per 24 hour  Intake 3946.75 ml  Output 601 ml  Net 3345.75 ml    PHYSICAL EXAMINATION:  GENERAL:  83 y.o.-year-old patient lying in the bed with no acute distress.  EYES: Pupils equal, round, reactive to light and accommodation. No scleral icterus. Extraocular muscles intact.  HEENT: Head atraumatic, normocephalic. Oropharynx and nasopharynx clear.  NECK:  Supple, no jugular venous distention. No thyroid enlargement, no tenderness.  LUNGS: Normal breath sounds bilaterally, no wheezing, rales,rhonchi or crepitation. No use of accessory muscles of respiration.  CARDIOVASCULAR: S1, S2 normal. No murmurs, rubs, or gallops.  ABDOMEN: Soft, non-tender, non-distended. Bowel sounds present. No organomegaly or mass.  EXTREMITIES: No pedal edema, cyanosis, or clubbing.  NEUROLOGIC: Cranial nerves II through XII are intact. Muscle strength 5/5 in all extremities. Sensation intact. Gait not checked.  PSYCHIATRIC: The patient is alert and oriented x 3.  SKIN: No obvious rash, lesion, or ulcer.   DATA REVIEW:   CBC Recent Labs  Lab 01/19/17 0453  WBC 5.4  HGB 11.5*  HCT 34.8*  PLT 164    Chemistries  Recent Labs  Lab 01/19/17 0453  NA 142  K 3.7  CL 105  CO2 29  GLUCOSE 106*  BUN 11  CREATININE 0.90  CALCIUM 8.5*  AST 17  ALT 15  ALKPHOS 57  BILITOT 1.1    Cardiac Enzymes Recent Labs  Lab 01/19/17 0453  TROPONINI <0.03    Microbiology Results  Results for orders placed or performed during the hospital encounter of  01/18/17  Gastrointestinal Panel by PCR , Stool     Status: None   Collection Time: 01/18/17 12:13 PM  Result Value Ref Range Status   Campylobacter species NOT DETECTED NOT DETECTED Final   Plesimonas shigelloides NOT DETECTED NOT DETECTED Final   Salmonella species NOT DETECTED NOT DETECTED Final   Yersinia enterocolitica NOT DETECTED NOT DETECTED Final   Vibrio species NOT DETECTED NOT DETECTED Final   Vibrio cholerae NOT DETECTED NOT DETECTED Final   Enteroaggregative E coli (EAEC) NOT DETECTED NOT DETECTED Final   Enteropathogenic E coli (EPEC) NOT DETECTED NOT DETECTED Final   Enterotoxigenic E coli (ETEC) NOT DETECTED NOT DETECTED Final   Shiga like toxin producing E coli (STEC) NOT DETECTED NOT DETECTED Final   Shigella/Enteroinvasive E coli (EIEC) NOT DETECTED NOT DETECTED Final   Cryptosporidium NOT DETECTED NOT DETECTED Final   Cyclospora cayetanensis  NOT DETECTED NOT DETECTED Final   Entamoeba histolytica NOT DETECTED NOT DETECTED Final   Giardia lamblia NOT DETECTED NOT DETECTED Final   Adenovirus F40/41 NOT DETECTED NOT DETECTED Final   Astrovirus NOT DETECTED NOT DETECTED Final   Norovirus GI/GII NOT DETECTED NOT DETECTED Final   Rotavirus A NOT DETECTED NOT DETECTED Final   Sapovirus (I, II, IV, and V) NOT DETECTED NOT DETECTED Final    Comment: Performed at [facility], [address] [place], [place] [address]  C difficile quick scan w PCR reflex     Status: None   Collection Time: 01/18/17 12:13 PM  Result Value Ref Range Status   C Diff antigen NEGATIVE NEGATIVE Final   C Diff toxin NEGATIVE NEGATIVE Final   C Diff interpretation No C. difficile detected.  Final    Comment: Performed at [facility], [address] [place], [place] [address]    RADIOLOGY:  Dg Chest 2 View  Result Date: 01/18/2017 CLINICAL DATA:  Syncope EXAM: CHEST  2 VIEW COMPARISON:  03/16/2016 chest radiograph. FINDINGS: Stable cardiomediastinal silhouette with mild  cardiomegaly and tortuous atherosclerotic thoracic aorta. No pneumothorax. No pleural effusion. Lungs appear clear, with no acute consolidative airspace disease and no pulmonary edema. Surgical clips are seen in the right upper quadrant of the abdomen. IMPRESSION: Stable mild cardiomegaly without pulmonary edema. No active pulmonary disease. Electronically Signed   By: Delbert Phenix M.D.   On: 01/18/2017 13:05   Ct Head Wo Contrast  Result Date: 01/18/2017 CLINICAL DATA:  Syncope. EXAM: CT HEAD WITHOUT CONTRAST TECHNIQUE: Contiguous axial images were obtained from the base of the skull through the vertex without intravenous contrast. COMPARISON:  MRI brain dated December 29, 2011. CT head dated December 28, 2011. FINDINGS: Brain: No evidence of acute infarction, hemorrhage, hydrocephalus, extra-axial collection or mass lesion/mass effect. Stable mild chronic microvascular ischemic white matter disease. Vascular: Atherosclerotic vascular calcification of the carotid siphons. No hyperdense vessel. Skull: Normal. Negative for fracture or focal lesion. Sinuses/Orbits: No acute finding. Other: None. IMPRESSION: 1. No acute intracranial abnormality. Stable mild chronic microvascular ischemic white matter disease. Electronically Signed   By: Obie Dredge M.D.   On: 01/18/2017 12:45   Mr Brain Wo Contrast  Result Date: 01/18/2017 CLINICAL DATA:  Syncope recurrent EXAM: MRI HEAD WITHOUT CONTRAST TECHNIQUE: Multiplanar, multiecho pulse sequences of the brain and surrounding structures were obtained without intravenous contrast. COMPARISON:  CT 01/18/2017, MRI 12/29/2011 FINDINGS: Brain: Ventricle size and cerebral volume normal for age. Negative for hydrocephalus 5 mm area of restricted diffusion left medial frontal lobe most compatible with acute infarct. No other acute infarct. Moderate chronic microvascular ischemic changes in the white matter and pons. Negative for hemorrhage or mass. Vascular: Normal arterial  flow voids Skull and upper cervical spine: No skull lesion. Spondylosis and spinal stenosis at C3-4 Sinuses/Orbits: Mucosal edema in the paranasal sinuses with bony thickening of the maxillary sinus bilaterally. Normal orbit Other: None IMPRESSION: 5 mm acute infarct left medial frontal lobe Moderate chronic microvascular ischemic change in the white matter and brainstem Spondylosis and spinal stenosis at C3-4. Electronically Signed   By: Marlan Palau M.D.   On: 01/18/2017 16:35   US Carotid Bilateral  Result Date: 01/18/2017 CLINICAL DATA:  83 year old female with a history of syncope EXAM: BILATERAL CAROTID DUPLEX ULTRASOUND TECHNIQUE: Wallace Cullens scale imaging, color Doppler and duplex ultrasound were performed of bilateral carotid and vertebral arteries in the neck. COMPARISON:  12/29/2011 FINDINGS: Criteria: Quantification of carotid  stenosis is based on velocity parameters that correlate the residual internal carotid diameter with NASCET-based stenosis levels, using the diameter of the distal internal carotid lumen as the denominator for stenosis measurement. The following velocity measurements were obtained: RIGHT ICA:  Systolic 75 cm/sec, Diastolic 21 cm/sec CCA:  54 cm/sec SYSTOLIC ICA/CCA RATIO:  1.4 ECA:  48 cm/sec LEFT ICA:  Systolic 118 cm/sec, Diastolic 25 cm/sec CCA:  54 cm/sec SYSTOLIC ICA/CCA RATIO:  2.2 ECA:  50 cm/sec Right Brachial SBP: Not acquired Left Brachial SBP: Not acquired RIGHT CAROTID ARTERY: No significant calcified disease of the right common carotid artery. Intermediate waveform maintained. Heterogeneous plaque without significant calcifications at the right carotid bifurcation. Low resistance waveform of the right ICA. No significant tortuosity. RIGHT VERTEBRAL ARTERY: Antegrade flow with low resistance waveform. LEFT CAROTID ARTERY: No significant calcified disease of the left common carotid artery. Intermediate waveform maintained. Heterogeneous plaque at the left carotid  bifurcation without significant calcifications. Low resistance waveform of the left ICA. LEFT VERTEBRAL ARTERY:  Antegrade flow with low resistance waveform. IMPRESSION: Color duplex indicates minimal heterogeneous plaque, with no hemodynamically significant stenosis by duplex criteria in the extracranial cerebrovascular circulation. Signed, Yvone NeuJaime S. Loreta AveWagner, DO [facility] Electronically Signed   By: Gilmer MorJaime  Wagner D.O.   On: 01/18/2017 15:51    EKG:   Orders placed or performed during the hospital encounter of 01/18/17  . ED EKG  . ED EKG  . EKG 12-Lead  . EKG 12-Lead  . EKG 12-Lead  . EKG 12-Lead      Management plans discussed with the patient, family and they are in agreement.  CODE STATUS:     Code Status Orders  (From admission, onward)        Start     Ordered   01/18/17 1641  Full code  Continuous     01/18/17 1640    Code Status History    Date Active Date Inactive Code Status Order ID Comments User Context   This patient has a current code status but no historical code status.      TOTAL TIME TAKING CARE OF THIS PATIENT: 35 minutes.    Altamese DillingVaibhavkumar Garrison Michie M.D on 01/19/2017 at 3:07 PM  Between 7am to 6pm - Pager - [phone]  After 6pm go to [email or web address] - password [email or web address]  [facility]  Office  [phone]  CC: Primary care physician; Marisue IvanLinthavong, Kanhka, MD   Note: This dictation was prepared with Dragon dictation along with smaller phrase technology. Any transcriptional errors that result from this process are unintentional.

## 2017-01-19 NOTE — Progress Notes (Signed)
MD notified of MRI results.

## 2017-01-19 NOTE — Plan of Care (Signed)
  Progressing Clinical Measurements: Will remain free from infection 01/19/2017 1003 - Progressing by Myles GipKimrey, Lenola Lockner M, RN Nutrition: Adequate nutrition will be maintained 01/19/2017 1003 - Progressing by Myles GipKimrey, Brittanee Ghazarian M, RN Pain Managment: General experience of comfort will improve 01/19/2017 1003 - Progressing by Myles GipKimrey, Aristidis Talerico M, RN Safety: Ability to remain free from injury will improve 01/19/2017 1003 - Progressing by Myles GipKimrey, Joeziah Voit M, RN Skin Integrity: Risk for impaired skin integrity will decrease 01/19/2017 1003 - Progressing by Myles GipKimrey, Crystie Yanko M, RN

## 2017-01-19 NOTE — Progress Notes (Signed)
Docotro Vacchani ordered to do a swallow test to the pt. Pt did passed the test. Will continue to monitor.

## 2017-01-19 NOTE — Evaluation (Signed)
Physical Therapy Evaluation Patient Details Name: Stephanie Graham MRN: [id number] DOB: July 16, 1921 Today's Date: 01/19/2017   History of Present Illness  Pt admitted for syncope secondary to complaints of syncopal episode at church along with complaints of N/V/D x 2 days. History includes HTN, DM, and blindness in R eye. Of note, pt with positive CVA on L side.  Clinical Impression  Pt is a pleasant 83 year old female who was admitted for syncope with now acute L side CVA. Pt performs bed mobility with mod I, transfers with cga, and ambulation with cga and RW. Pt demonstrates deficits with strength (B LE) /balance/mobility. Pt needs cues to stay close to RW as she is at increased risk for falls with decreased safety awareness. Recommend increased supervision for OOB mobility. Pt is close to baseline level. No coordination/sensation deficits noted. L=R side strength. Pt ambulates with forward flexed posture, baseline level per patient. Would benefit from skilled PT to address above deficits and promote optimal return to PLOF. Recommend transition to HHPT upon discharge from acute hospitalization.       Follow Up Recommendations Home health PT;Supervision for mobility/OOB    Equipment Recommendations  None recommended by PT    Recommendations for Other Services       Precautions / Restrictions Precautions Precautions: Fall Restrictions Weight Bearing Restrictions: No      Mobility  Bed Mobility Overal bed mobility: Modified Independent             General bed mobility comments: safe technique with bed rail used  Transfers Overall transfer level: Needs assistance Equipment used: Rolling walker (2 wheeled) Transfers: Sit to/from Stand Sit to Stand: Min guard         General transfer comment: safe technique with cues to push from seated surface. Once standing, forward flexed posture noted  Ambulation/Gait Ambulation/Gait assistance: Min guard Ambulation Distance  (Feet): 40 Feet Assistive device: Rolling walker (2 wheeled) Gait Pattern/deviations: Step-to pattern     General Gait Details: ambulated around room, in/out bathroom, and back to door to recliner. Pt refused to ambulate in hallway this date. Pt needs cues to keep close to RW however no LOB noted. Able to safely navigate obstacles in room, however does need constant supervision for OOB mobility.  Stairs            Wheelchair Mobility    Modified Rankin (Stroke Patients Only)       Balance Overall balance assessment: Needs assistance Sitting-balance support: Feet supported Sitting balance-Leahy Scale: Normal     Standing balance support: Bilateral upper extremity supported Standing balance-Leahy Scale: Good                               Pertinent Vitals/Pain Pain Assessment: No/denies pain    Home Living Family/patient expects to be discharged to:: Private residence Living Arrangements: (lives with Information systems managergrandaughter) Available Help at Discharge: Family;Available PRN/intermittently(pt is home during day alone) Type of Home: House Home Access: Stairs to enter Entrance Stairs-Rails: Can reach both Entrance Stairs-Number of Steps: 2 Home Layout: One level Home Equipment: Walker - 2 wheels      Prior Function Level of Independence: Independent with assistive device(s)         Comments: uses RW at all times, no falls history     Hand Dominance        Extremity/Trunk Assessment   Upper Extremity Assessment Upper Extremity Assessment: Overall WFL for tasks assessed  Lower Extremity Assessment Lower Extremity Assessment: Generalized weakness(B LE grossly 4/5)       Communication   Communication: No difficulties  Cognition Arousal/Alertness: Awake/alert Behavior During Therapy: WFL for tasks assessed/performed Overall Cognitive Status: Within Functional Limits for tasks assessed                                         General Comments      Exercises Other Exercises Other Exercises: Pt ambulated to bathroom with cga. Needs supervision for set up and don/doffing new diaper. Able to maintain static standing balance while therapist adjusted undergarmets. Pt able to independently perform self hyigene.   Assessment/Plan    PT Assessment Patient needs continued PT services  PT Problem List Decreased strength;Decreased safety awareness       PT Treatment Interventions DME instruction;Gait training;Therapeutic exercise    PT Goals (Current goals can be found in the Care Plan section)  Acute Rehab PT Goals Patient Stated Goal: to get stronger PT Goal Formulation: With patient Time For Goal Achievement: 02/02/17 Potential to Achieve Goals: Good    Frequency 7X/week   Barriers to discharge        Co-evaluation               AM-PAC PT "6 Clicks" Daily Activity  Outcome Measure Difficulty turning over in bed (including adjusting bedclothes, sheets and blankets)?: None Difficulty moving from lying on back to sitting on the side of the bed? : None Difficulty sitting down on and standing up from a chair with arms (e.g., wheelchair, bedside commode, etc,.)?: Unable Help needed moving to and from a bed to chair (including a wheelchair)?: A Little Help needed walking in hospital room?: A Little Help needed climbing 3-5 steps with a railing? : A Little 6 Click Score: 18    End of Session Equipment Utilized During Treatment: Gait belt Activity Tolerance: Patient tolerated treatment well Patient left: in chair;with chair alarm set Nurse Communication: Mobility status PT Visit Diagnosis: Unsteadiness on feet (R26.81);Muscle weakness (generalized) (M62.81)    Time: [id number] PT Time Calculation (min) (ACUTE ONLY): 21 min   Charges:   PT Evaluation $PT Eval Low Complexity: 1 Low PT Treatments $Therapeutic Activity: 8-22 mins   PT G Codes:   PT G-Codes **NOT FOR INPATIENT CLASS** Functional  Assessment Tool Used: AM-PAC 6 Clicks Basic Mobility Functional Limitation: Mobility: Walking and moving around Mobility: Walking and Moving Around Current Status ([id number](G8978): At least 40 percent but less than 60 percent impaired, limited or restricted Mobility: Walking and Moving Around Goal Status [phone](G8979): At least 20 percent but less than 40 percent impaired, limited or restricted    Elizabeth PalauStephanie Flem Enderle, PT, DPT [phone]   Sula Fetterly 01/19/2017, 10:22 AM

## 2017-01-19 NOTE — Care Management Important Message (Signed)
Important Message  Patient Details  Name: Stephanie Graham MRN: [id number] Date of Birth: December 20, 1921   Medicare Important Message Given:  Yes  Signed IM notice given  Eber HongGreene, Yesika Rispoli R, RN 01/19/2017, 12:27 PM

## 2017-01-19 NOTE — Care Management (Signed)
Provided family with application for medicaid pcs referral and added SW to home health referral

## 2017-01-20 NOTE — Consult Note (Signed)
Reason for Consult: Syncope loss of consciousness altered mental status Referring Physician: Dr. Georgie Chard hospitalist Dr. Netty Starring primary Cardiologist Dr. Jana Half Stephanie Graham is an 84 y.o. female.  HPI: 83 year old black female who has a history of hypertension diabetes reportedly had a syncopal episode while at church.  Patient been having nausea vomiting diarrhea for the past 2-3 days.  Patient lives with granddaughter she had an episode rescue was called by the time rescue got there she was awake denied any significant symptoms denies any chest pain palpitations or tachycardia.  Patient had episodes of nausea vomiting dry heaves before syncopal episode.  Patient does not have a significant history of passing out while at church.  She was brought to the emergency room EKG chest x-ray laboratory CT of the head all died nondiagnostic but she was admitted for further assessment.  Denies any chest pain no worsening shortness of breath no recent trauma.  The office with Stephanie Graham or Horris Latino in the last 3-6 months with uneventful evaluation.  Past Medical History:  Diagnosis Date  . 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 Diabetes mellitus without complication ([facility])   . Hypertension     Past Surgical History:  Procedure Laterality Date  . REPLACEMENT TOTAL KNEE Left     History reviewed. No pertinent family history.  Social History:  reports that  has never smoked. She has quit using smokeless tobacco. She reports that she does not drink alcohol or use drugs.  Allergies: No Known Allergies  Medications: See previous medication list  Results for orders placed or performed during the hospital encounter of 01/18/17 (from the past 48 hour(s))  Glucose, capillary     Status: Abnormal   Collection Time: 01/18/17  9:01 PM  Result Value Ref Range   Glucose-Capillary 130 (H) 65 - 99 mg/dL  Troponin I     Status: None   Collection Time: 01/18/17 10:49 PM   Result Value Ref Range   Troponin I <0.03 <0.03 ng/mL    Comment: Performed at [facility], [address] [place], [place] [address]  Troponin I     Status: None   Collection Time: 01/19/17  4:53 AM  Result Value Ref Range   Troponin I <0.03 <0.03 ng/mL    Comment: Performed at [facility], [address] [place], [place] [address]  CBC     Status: Abnormal   Collection Time: 01/19/17  4:53 AM  Result Value Ref Range   WBC 5.4 3.6 - 11.0 K/uL   RBC 3.67 (L) 3.80 - 5.20 MIL/uL   Hemoglobin 11.5 (L) 12.0 - 16.0 g/dL   HCT 34.8 (L) 35.0 - 47.0 %   MCV 94.7 80.0 - 100.0 fL   MCH 31.3 26.0 - 34.0 pg   MCHC 33.0 32.0 - 36.0 g/dL   RDW 13.8 11.5 - 14.5 %   Platelets 164 150 - 440 K/uL    Comment: Performed at [facility], [address] [place], [place] [address]  Comprehensive metabolic panel     Status: Abnormal   Collection Time: 01/19/17  4:53 AM  Result Value Ref Range   Sodium 142 135 - 145 mmol/L   Potassium 3.7 3.5 - 5.1 mmol/L   Chloride 105 101 - 111 mmol/L   CO2 29 22 - 32 mmol/L   Glucose, Bld 106 (H) 65 - 99 mg/dL   BUN 11 6 - 20 mg/dL   Creatinine, Ser 0.90 0.44 -  1.00 mg/dL   Calcium 8.5 (L) 8.9 - 10.3 mg/dL   Total Protein 5.9 (L) 6.5 - 8.1 g/dL   Albumin 3.2 (L) 3.5 - 5.0 g/dL   AST 17 15 - 41 U/L   ALT 15 14 - 54 U/L   Alkaline Phosphatase 57 38 - 126 U/L   Total Bilirubin 1.1 0.3 - 1.2 mg/dL   GFR calc non Af Amer 53 (L) >60 mL/min   GFR calc Af Amer >60 >60 mL/min    Comment: (NOTE) The eGFR has been calculated using the CKD EPI equation. This calculation has not been validated in all clinical situations. eGFR's persistently <60 mL/min signify possible Chronic Kidney Disease.    Anion gap 8 5 - 15    Comment: Performed at [facility], [address] [place], [place] [address]  Hemoglobin A1c     Status: Abnormal   Collection Time: 01/19/17  4:53 AM  Result Value Ref Range   Hgb A1c MFr Bld 5.9 (H) 4.8 -  5.6 %    Comment: (NOTE) Pre diabetes:          5.7%-6.4% Diabetes:              >6.4% Glycemic control for   <7.0% adults with diabetes    Mean Plasma Glucose 122.63 mg/dL    Comment: Performed at [facility] [address], [place], [place] [address]  Lipid panel     Status: Abnormal   Collection Time: 01/19/17  4:53 AM  Result Value Ref Range   Cholesterol 156 0 - 200 mg/dL   Triglycerides 163 (H) <150 mg/dL   HDL 67 >40 mg/dL   Total CHOL/HDL Ratio 2.3 RATIO   VLDL 33 0 - 40 mg/dL   LDL Cholesterol 56 0 - 99 mg/dL    Comment:        Total Cholesterol/HDL:CHD Risk Coronary Heart Disease Risk Table                     Men   Women  1/2 Average Risk   3.4   3.3  Average Risk       5.0   4.4  2 X Average Risk   9.6   7.1  3 X Average Risk  23.4   11.0        Use the calculated Patient Ratio above and the CHD Risk Table to determine the patient's CHD Risk.        ATP III CLASSIFICATION (LDL):  <100     mg/dL   Optimal  100-129  mg/dL   Near or Above                    Optimal  130-159  mg/dL   Borderline  160-189  mg/dL   High  >190     mg/dL   Very High Performed at [facility], [address] [place], [place] [address]   Glucose, capillary     Status: Abnormal   Collection Time: 01/19/17  8:46 AM  Result Value Ref Range   Glucose-Capillary 104 (H) 65 - 99 mg/dL   Comment 1 Notify RN    Comment 2 Document in Chart   Glucose, capillary     Status: Abnormal   Collection Time: 01/19/17 12:09 PM  Result Value Ref Range   Glucose-Capillary 129 (H) 65 - 99 mg/dL   Comment 1 Notify RN    Comment 2 Document in Chart  Glucose, capillary     Status: None   Collection Time: 01/19/17  5:07 PM  Result Value Ref Range   Glucose-Capillary 83 65 - 99 mg/dL   Comment 1 Notify RN    Comment 2 Document in Chart     No results found.  Review of Systems  Constitutional: Positive for diaphoresis and malaise/fatigue.  HENT: Positive for congestion and  hearing loss.   Eyes: Positive for blurred vision.  Respiratory: Positive for shortness of breath.   Cardiovascular: Negative.   Gastrointestinal: Positive for heartburn, nausea and vomiting.  Genitourinary: Negative.   Musculoskeletal: Positive for myalgias.  Skin: Negative.   Neurological: Positive for loss of consciousness and weakness.  Endo/Heme/Allergies: Negative.   Psychiatric/Behavioral: Negative.    Blood pressure (!) 162/58, pulse (!) 55, temperature 98.6 F (37 C), temperature source Oral, resp. rate 18, height 4' 11.76" (1.518 m), weight 130 lb 1.6 oz (59 kg), SpO2 97 %. Physical Exam  Nursing note and vitals reviewed. Constitutional: She is oriented to person, place, and time. She appears well-developed and well-nourished.  HENT:  Head: Normocephalic and atraumatic.  Blindness in one eye  Eyes: Conjunctivae and EOM are normal. Pupils are equal, round, and reactive to light.  Neck: Normal range of motion. Neck supple.  Cardiovascular: Normal rate and regular rhythm.  Murmur heard. Respiratory: Effort normal and breath sounds normal.  GI: Soft. Bowel sounds are normal.  Musculoskeletal: Normal range of motion.  Neurological: She is alert and oriented to person, place, and time. She has normal reflexes.  Skin: Skin is warm and dry.  Psychiatric: She has a normal mood and affect.    Assessment/Plan: Syncope Altered mental status Blindness right eye Nausea vomiting possible gastroenteritis Abnormal EKG Dehydration . Plan Agree with admission to telemetry rule out myocardial infarction Consider Holter monitor because of history of syncope Altered mental status with loss of consciousness unclear etiology Recommend referral to neurology for further assessment possible seizure disorder Maintain hypertension control and management Lipid management for hyperlipidemia Recommend omeprazole therapy for possible gastroenteritis possible vasovagal syncope Echocardiogram  today showed normal left ventricular function otherwise uneventful echocardiogram would recommend Holter or event monitor Have The patient discharged home follow-up with Dr. Nehemiah Graham as an outpatient for further workup and assessment   Dwayne D Callwood 01/20/2017, 6:38 PM

## 2018-02-21 ENCOUNTER — Other Ambulatory Visit: Payer: Self-pay

## 2018-02-21 ENCOUNTER — Inpatient Hospital Stay
Admission: EM | Admit: 2018-02-21 | Discharge: 2018-02-24 | DRG: 922 | Disposition: A | Discharge status: Skilled Nursing Facility | Payer: Medicare Other | Attending: Internal Medicine | Admitting: Internal Medicine

## 2018-02-21 ENCOUNTER — Encounter: Payer: Self-pay | Admitting: Emergency Medicine

## 2018-02-21 ENCOUNTER — Inpatient Hospital Stay: Payer: Medicare Other

## 2018-02-21 ENCOUNTER — Emergency Department: Payer: Medicare Other

## 2018-02-21 DIAGNOSIS — F0391 Unspecified dementia with behavioral disturbance: Secondary | ICD-10-CM | POA: Diagnosis present

## 2018-02-21 DIAGNOSIS — G92 Toxic encephalopathy: Secondary | ICD-10-CM | POA: Diagnosis present

## 2018-02-21 DIAGNOSIS — Z7984 Long term (current) use of oral hypoglycemic drugs: Secondary | ICD-10-CM

## 2018-02-21 DIAGNOSIS — H5461 Unqualified visual loss, right eye, normal vision left eye: Secondary | ICD-10-CM | POA: Diagnosis present

## 2018-02-21 DIAGNOSIS — Z6822 Body mass index (BMI) 22.0-22.9, adult: Secondary | ICD-10-CM | POA: Diagnosis not present

## 2018-02-21 DIAGNOSIS — T424X5A Adverse effect of benzodiazepines, initial encounter: Secondary | ICD-10-CM | POA: Diagnosis present

## 2018-02-21 DIAGNOSIS — E1165 Type 2 diabetes mellitus with hyperglycemia: Secondary | ICD-10-CM | POA: Diagnosis present

## 2018-02-21 DIAGNOSIS — Z791 Long term (current) use of non-steroidal anti-inflammatories (NSAID): Secondary | ICD-10-CM | POA: Diagnosis not present

## 2018-02-21 DIAGNOSIS — T434X5A Adverse effect of butyrophenone and thiothixene neuroleptics, initial encounter: Secondary | ICD-10-CM | POA: Diagnosis present

## 2018-02-21 DIAGNOSIS — T68XXXA Hypothermia, initial encounter: Secondary | ICD-10-CM | POA: Diagnosis not present

## 2018-02-21 DIAGNOSIS — E440 Moderate protein-calorie malnutrition: Secondary | ICD-10-CM | POA: Diagnosis present

## 2018-02-21 DIAGNOSIS — I252 Old myocardial infarction: Secondary | ICD-10-CM

## 2018-02-21 DIAGNOSIS — E860 Dehydration: Secondary | ICD-10-CM | POA: Diagnosis present

## 2018-02-21 DIAGNOSIS — E872 Acidosis, unspecified: Secondary | ICD-10-CM

## 2018-02-21 DIAGNOSIS — X31XXXA Exposure to excessive natural cold, initial encounter: Secondary | ICD-10-CM

## 2018-02-21 DIAGNOSIS — Z87891 Personal history of nicotine dependence: Secondary | ICD-10-CM | POA: Diagnosis not present

## 2018-02-21 DIAGNOSIS — I248 Other forms of acute ischemic heart disease: Secondary | ICD-10-CM | POA: Diagnosis present

## 2018-02-21 DIAGNOSIS — E876 Hypokalemia: Secondary | ICD-10-CM | POA: Diagnosis not present

## 2018-02-21 DIAGNOSIS — Z79899 Other long term (current) drug therapy: Secondary | ICD-10-CM

## 2018-02-21 DIAGNOSIS — Z9183 Wandering in diseases classified elsewhere: Secondary | ICD-10-CM | POA: Diagnosis not present

## 2018-02-21 DIAGNOSIS — Z96652 Presence of left artificial knee joint: Secondary | ICD-10-CM | POA: Diagnosis present

## 2018-02-21 DIAGNOSIS — Z7982 Long term (current) use of aspirin: Secondary | ICD-10-CM | POA: Diagnosis not present

## 2018-02-21 DIAGNOSIS — I10 Essential (primary) hypertension: Secondary | ICD-10-CM | POA: Diagnosis present

## 2018-02-21 DIAGNOSIS — Y92239 Unspecified place in hospital as the place of occurrence of the external cause: Secondary | ICD-10-CM | POA: Diagnosis present

## 2018-02-21 DIAGNOSIS — R52 Pain, unspecified: Secondary | ICD-10-CM

## 2018-02-21 LAB — CBC WITH DIFFERENTIAL/PLATELET
Abs Immature Granulocytes: 0.04 10*3/uL (ref 0.00–0.07)
BASOS ABS: 0 10*3/uL (ref 0.0–0.1)
Basophils Relative: 0 %
Eosinophils Absolute: 0.1 10*3/uL (ref 0.0–0.5)
Eosinophils Relative: 1 %
HEMATOCRIT: 43.3 % (ref 36.0–46.0)
Hemoglobin: 13.1 g/dL (ref 12.0–15.0)
IMMATURE GRANULOCYTES: 1 %
Lymphocytes Relative: 23 %
Lymphs Abs: 2 10*3/uL (ref 0.7–4.0)
MCH: 30.4 pg (ref 26.0–34.0)
MCHC: 30.3 g/dL (ref 30.0–36.0)
MCV: 100.5 fL — ABNORMAL HIGH (ref 80.0–100.0)
Monocytes Absolute: 0.2 10*3/uL (ref 0.1–1.0)
Monocytes Relative: 2 %
NRBC: 0 % (ref 0.0–0.2)
Neutro Abs: 6.4 10*3/uL (ref 1.7–7.7)
Neutrophils Relative %: 73 %
Platelets: 157 10*3/uL (ref 150–400)
RBC: 4.31 MIL/uL (ref 3.87–5.11)
RDW: 13.4 % (ref 11.5–15.5)
WBC: 8.8 10*3/uL (ref 4.0–10.5)

## 2018-02-21 LAB — GLUCOSE, CAPILLARY
Glucose-Capillary: 111 mg/dL — ABNORMAL HIGH (ref 70–99)
Glucose-Capillary: 74 mg/dL (ref 70–99)
Glucose-Capillary: 117 mg/dL — ABNORMAL HIGH (ref 70–99)

## 2018-02-21 LAB — URINALYSIS, COMPLETE (UACMP) WITH MICROSCOPIC
BACTERIA UA: NONE SEEN
Bilirubin Urine: NEGATIVE
Glucose, UA: 150 mg/dL — AB
Ketones, ur: 5 mg/dL — AB
Leukocytes, UA: NEGATIVE
Nitrite: NEGATIVE
Protein, ur: NEGATIVE mg/dL
Specific Gravity, Urine: 1.008 (ref 1.005–1.030)
Squamous Epithelial / HPF: NONE SEEN (ref 0–5)
pH: 6 (ref 5.0–8.0)

## 2018-02-21 LAB — COMPREHENSIVE METABOLIC PANEL
ALT: 20 U/L (ref 0–44)
ANION GAP: 12 (ref 5–15)
AST: 34 U/L (ref 15–41)
Albumin: 4.1 g/dL (ref 3.5–5.0)
Alkaline Phosphatase: 57 U/L (ref 38–126)
BUN: 18 mg/dL (ref 8–23)
CO2: 21 mmol/L — ABNORMAL LOW (ref 22–32)
Calcium: 8.7 mg/dL — ABNORMAL LOW (ref 8.9–10.3)
Chloride: 107 mmol/L (ref 98–111)
Creatinine, Ser: 0.92 mg/dL (ref 0.44–1.00)
GFR calc Af Amer: >60 mL/min (ref >60)
GFR calc non Af Amer: 53 mL/min — ABNORMAL LOW (ref >60)
Glucose, Bld: 184 mg/dL — ABNORMAL HIGH (ref 70–99)
POTASSIUM: 4.9 mmol/L (ref 3.5–5.1)
Sodium: 140 mmol/L (ref 135–145)
Total Bilirubin: 1.1 mg/dL (ref 0.3–1.2)
Total Protein: 7.3 g/dL (ref 6.5–8.1)

## 2018-02-21 LAB — TROPONIN I
TROPONIN I: 1.08 ng/mL — AB (ref <0.03)
Troponin I: 0.12 ng/mL (ref <0.03)
Troponin I: 0.57 ng/mL (ref <0.03)
Troponin I: 1.36 ng/mL (ref <0.03)

## 2018-02-21 LAB — CK: Total CK: 292 U/L — ABNORMAL HIGH (ref 38–234)

## 2018-02-21 LAB — MRSA PCR SCREENING: MRSA by PCR: POSITIVE — AB

## 2018-02-21 LAB — LACTIC ACID, PLASMA
LACTIC ACID, VENOUS: 5.4 mmol/L — AB (ref 0.5–1.9)
LACTIC ACID, VENOUS: 2.6 mmol/L — AB (ref 0.5–1.9)

## 2018-02-21 LAB — TSH: TSH: 2.456 u[IU]/mL (ref 0.350–4.500)

## 2018-02-21 MED ORDER — SODIUM CHLORIDE 0.9 % IV SOLN
1000.0000 mL | INTRAVENOUS | Status: DC
  Administered 2018-02-21: 1000 mL via INTRAVENOUS

## 2018-02-21 MED ORDER — METRONIDAZOLE IN NACL 5-0.79 MG/ML-% IV SOLN
500.0000 mg | Freq: Three times a day (TID) | INTRAVENOUS | Status: DC
  Administered 2018-02-21: 500 mg via INTRAVENOUS
  Filled 2018-02-21: qty 100

## 2018-02-21 MED ORDER — ASPIRIN 325 MG PO TABS
325.0000 mg | ORAL_TABLET | Freq: Once | ORAL | Status: AC
  Administered 2018-02-21: 325 mg via ORAL
  Filled 2018-02-21: qty 1

## 2018-02-21 MED ORDER — VANCOMYCIN HCL IN DEXTROSE 1-5 GM/200ML-% IV SOLN
1000.0000 mg | Freq: Once | INTRAVENOUS | Status: AC
  Administered 2018-02-21: 1000 mg via INTRAVENOUS
  Filled 2018-02-21: qty 200

## 2018-02-21 MED ORDER — ASPIRIN 300 MG RE SUPP: 300.0000 mg | Freq: Once | RECTAL | Status: DC

## 2018-02-21 MED ORDER — ONDANSETRON HCL 4 MG/2ML IJ SOLN: 4.0000 mg | Freq: Four times a day (QID) | INTRAMUSCULAR | Status: DC | PRN

## 2018-02-21 MED ORDER — ENOXAPARIN SODIUM 40 MG/0.4ML ~~LOC~~ SOLN
40.0000 mg | SUBCUTANEOUS | Status: DC
  Administered 2018-02-21 – 2018-02-23 (×3): 40 mg via SUBCUTANEOUS
  Filled 2018-02-21 (×4): qty 0.4

## 2018-02-21 MED ORDER — SODIUM CHLORIDE 0.9 % IV SOLN
INTRAVENOUS | Status: DC
  Administered 2018-02-21 – 2018-02-24 (×7): via INTRAVENOUS

## 2018-02-21 MED ORDER — INSULIN ASPART 100 UNIT/ML ~~LOC~~ SOLN
0.0000 [IU] | Freq: Three times a day (TID) | SUBCUTANEOUS | Status: DC
  Administered 2018-02-22 (×2): 1 [IU] via SUBCUTANEOUS
  Administered 2018-02-23: 13:00:00 3 [IU] via SUBCUTANEOUS
  Administered 2018-02-23: 17:00:00 1 [IU] via SUBCUTANEOUS
  Administered 2018-02-24: 13:00:00 3 [IU] via SUBCUTANEOUS
  Administered 2018-02-24: 09:00:00 1 [IU] via SUBCUTANEOUS
  Filled 2018-02-21 (×6): qty 1

## 2018-02-21 MED ORDER — ONDANSETRON HCL 4 MG PO TABS: 4.0000 mg | ORAL_TABLET | Freq: Four times a day (QID) | ORAL | Status: DC | PRN

## 2018-02-21 MED ORDER — ACETAMINOPHEN 650 MG RE SUPP: 650.0000 mg | Freq: Four times a day (QID) | RECTAL | Status: DC | PRN

## 2018-02-21 MED ORDER — POLYETHYLENE GLYCOL 3350 17 G PO PACK: 17.0000 g | PACK | Freq: Every day | ORAL | Status: DC | PRN

## 2018-02-21 MED ORDER — ACETAMINOPHEN 325 MG PO TABS: 650.0000 mg | ORAL_TABLET | Freq: Four times a day (QID) | ORAL | Status: DC | PRN

## 2018-02-21 MED ORDER — SODIUM CHLORIDE 0.9 % IV SOLN
2.0000 g | Freq: Once | INTRAVENOUS | Status: AC
  Administered 2018-02-21: 2 g via INTRAVENOUS
  Filled 2018-02-21: qty 2

## 2018-02-21 MED ORDER — MUPIROCIN 2 % EX OINT
1.0000 "application " | TOPICAL_OINTMENT | Freq: Two times a day (BID) | CUTANEOUS | Status: DC
  Administered 2018-02-21 – 2018-02-24 (×7): 1 via NASAL
  Filled 2018-02-21: qty 22

## 2018-02-21 NOTE — Progress Notes (Signed)
Pt transferred to 1C from ICU.  Pt awake, confused, but follow commands. Pt is NPO and on bedrest.  This writer noted on assessment that pt's L dorsiflexion/planterflexion is very limited compare to R. Pt verbalized it hurts to move it.  Also, last troponin was elevated and still being checked, but tele d/c. Notified Dr Juliene PinaMody of the above.  Per MD to order Dys 2 diet and to d/c bedrest and to order tele.

## 2018-02-21 NOTE — ED Notes (Signed)
Patient transported to CT 

## 2018-02-21 NOTE — ED Notes (Signed)
ED TO INPATIENT HANDOFF REPORT  ED Nurse Name and Phone #: Victorino Dike [phone]  Name/Age/Gender Stephanie Graham 85 y.o. female Room/Bed: ED15A/ED15A  Code Status   Code Status: Prior  Home/SNF/Other Home disoriented Is this baseline? Yes   Triage Complete: Triage complete  Chief Complaint Hypothermia   Triage Note PT to ER via EMS from home where she was found outside by a passerby.  Pt has hx of dementia.  Pt was found with clothing around her ankles and soiled with stool.  Pt clothing was all wet.  PT was combative and received 2mg  versed IM and 5mg  Haldol IV.     Allergies No Known Allergies  Level of Care/Admitting Diagnosis ED Disposition    ED Disposition Condition Comment   Admit  Hospital Area: [facility] [[id number]]  Level of Care: ICU [6]  Diagnosis: Hypothermia [960454]  Admitting Physician: Adrian Saran [[id number]]  Attending Physician: MODY, Patricia Pesa [[id number]]  Estimated length of stay: past midnight tomorrow  Certification:: I certify this patient will need inpatient services for at least 2 midnights  PT Class (Do Not Modify): Inpatient [101]  PT Acc Code (Do Not Modify): Private [1]       Medical/Surgery History Past Medical History:  Diagnosis Date  . 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 Diabetes mellitus without complication ([facility])   . Hypertension    Past Surgical History:  Procedure Laterality Date  . REPLACEMENT TOTAL KNEE Left      IV Location/Drains/Wounds Patient Lines/Drains/Airways Status   Active Line/Drains/Airways    Name:   Placement date:   Placement time:   Site:   Days:   Peripheral IV 02/21/18 Right Forearm   02/21/18    0852    Forearm   less than 1   Peripheral IV 02/21/18 Right Hand   02/21/18    0940    Hand   less than 1   Urethral Catheter Amber, RN Straight-tip;Temperature probe 16 Fr.   02/21/18    0904    Straight-tip;Temperature probe   less than 1   External Urinary Catheter    01/18/17    1823    -   399          Intake/Output Last 24 hours No intake or output data in the 24 hours ending 02/21/18 1051  Labs/Imaging Results for orders placed or performed during the hospital encounter of 02/21/18 (from the past 48 hour(s))  Lactic acid, plasma     Status: Abnormal   Collection Time: 02/21/18  9:11 AM  Result Value Ref Range   Lactic Acid, Venous 5.4 (HH) 0.5 - 1.9 mmol/L    Comment: CRITICAL RESULT CALLED TO, READ BACK BY AND VERIFIED WITH Arby Dahir ON 02/21/2018 AT 0941 QSD Performed at [facility], [address] [place], [place] [address]   Comprehensive metabolic panel     Status: Abnormal   Collection Time: 02/21/18  9:11 AM  Result Value Ref Range   Sodium 140 135 - 145 mmol/L   Potassium 4.9 3.5 - 5.1 mmol/L    Comment: HEMOLYSIS AT THIS LEVEL MAY AFFECT RESULT   Chloride 107 98 - 111 mmol/L   CO2 21 (L) 22 - 32 mmol/L   Glucose, Bld 184 (H) 70 - 99 mg/dL   BUN 18 8 - 23 mg/dL   Creatinine, Ser 0.86 0.44 - 1.00 mg/dL   Calcium 8.7 (L) 8.9 - 10.3 mg/dL   Total Protein 7.3  6.5 - 8.1 g/dL   Albumin 4.1 3.5 - 5.0 g/dL   AST 34 15 - 41 U/L    Comment: HEMOLYSIS AT THIS LEVEL MAY AFFECT RESULT   ALT 20 0 - 44 U/L   Alkaline Phosphatase 57 38 - 126 U/L   Total Bilirubin 1.1 0.3 - 1.2 mg/dL    Comment: HEMOLYSIS AT THIS LEVEL MAY AFFECT RESULT   GFR calc non Af Amer 53 (L) >60 mL/min   GFR calc Af Amer >60 >60 mL/min   Anion gap 12 5 - 15    Comment: Performed at [facility], [address] [place], [place] [address]  Troponin I - ONCE - STAT     Status: Abnormal   Collection Time: 02/21/18  9:11 AM  Result Value Ref Range   Troponin I 0.12 (HH) <0.03 ng/mL    Comment: CRITICAL RESULT CALLED TO, READ BACK BY AND VERIFIED WITH Amay Mijangos ON 02/21/2018 AT 3734 QSD Performed at [facility], [address] [place], [place] [address]   CBC WITH DIFFERENTIAL     Status: Abnormal   Collection Time:  02/21/18  9:11 AM  Result Value Ref Range   WBC 8.8 4.0 - 10.5 K/uL   RBC 4.31 3.87 - 5.11 MIL/uL   Hemoglobin 13.1 12.0 - 15.0 g/dL   HCT 11.5 72.6 - 20.3 %   MCV 100.5 (H) 80.0 - 100.0 fL   MCH 30.4 26.0 - 34.0 pg   MCHC 30.3 30.0 - 36.0 g/dL   RDW 55.9 74.1 - 63.8 %   Platelets 157 150 - 400 K/uL   nRBC 0.0 0.0 - 0.2 %   Neutrophils Relative % 73 %   Neutro Abs 6.4 1.7 - 7.7 K/uL   Lymphocytes Relative 23 %   Lymphs Abs 2.0 0.7 - 4.0 K/uL   Monocytes Relative 2 %   Monocytes Absolute 0.2 0.1 - 1.0 K/uL   Eosinophils Relative 1 %   Eosinophils Absolute 0.1 0.0 - 0.5 K/uL   Basophils Relative 0 %   Basophils Absolute 0.0 0.0 - 0.1 K/uL   Immature Granulocytes 1 %   Abs Immature Granulocytes 0.04 0.00 - 0.07 K/uL    Comment: Performed at [facility], [address] [place], [place] [address]  Urinalysis, Complete w Microscopic     Status: Abnormal   Collection Time: 02/21/18  9:11 AM  Result Value Ref Range   Color, Urine COLORLESS (A) YELLOW   APPearance CLEAR (A) CLEAR   Specific Gravity, Urine 1.008 1.005 - 1.030   pH 6.0 5.0 - 8.0   Glucose, UA 150 (A) NEGATIVE mg/dL   Hgb urine dipstick SMALL (A) NEGATIVE   Bilirubin Urine NEGATIVE NEGATIVE   Ketones, ur 5 (A) NEGATIVE mg/dL   Protein, ur NEGATIVE NEGATIVE mg/dL   Nitrite NEGATIVE NEGATIVE   Leukocytes, UA NEGATIVE NEGATIVE   RBC / HPF 0-5 0 - 5 RBC/hpf   WBC, UA 0-5 0 - 5 WBC/hpf   Bacteria, UA NONE SEEN NONE SEEN   Squamous Epithelial / LPF NONE SEEN 0 - 5   Hyaline Casts, UA PRESENT     Comment: Performed at [facility], [address], [place], [place] [address]   Ct Head Wo Contrast  Result Date: 02/21/2018 CLINICAL DATA:  Altered mental status. EXAM: CT HEAD WITHOUT CONTRAST TECHNIQUE: Contiguous axial images were obtained from the base of the skull through the vertex without intravenous contrast. COMPARISON:  CT scan of January 18, 2017. FINDINGS: Brain: Mild chronic ischemic white  matter disease is noted. No mass effect or midline shift is noted. Ventricular size is within normal limits. There is no evidence of mass lesion, hemorrhage or acute infarction. Vascular: No hyperdense vessel or unexpected calcification. Skull: Normal. Negative for fracture or focal lesion. Sinuses/Orbits: Left maxillary and ethmoid sinusitis is noted. Other: None. IMPRESSION: Mild chronic ischemic white matter disease. No acute intracranial abnormality seen. Electronically Signed   By: Lupita RaiderJames  Green Jr, M.D.   On: 02/21/2018 10:21   Dg Chest Port 1 View  Result Date: 02/21/2018 CLINICAL DATA:  Hypothermia EXAM: PORTABLE CHEST 1 VIEW COMPARISON:  01/18/2017 FINDINGS: Lungs are clear.  No pleural effusion or pneumothorax. Cardiomegaly. IMPRESSION: No evidence of acute cardiopulmonary disease. Electronically Signed   By: Charline BillsSriyesh  Krishnan M.D.   On: 02/21/2018 09:46    Pending Labs Unresulted Labs (From admission, onward)    Start     Ordered   02/21/18 1037  CK  Add-on,   AD     02/21/18 1037   02/21/18 0903  Lactic acid, plasma  STAT Now then every 3 hours,   STAT     02/21/18 0903   02/21/18 0903  Blood Culture (routine x 2)  BLOOD CULTURE X 2,   STAT     02/21/18 0903   02/21/18 0903  Urine culture  ONCE - STAT,   STAT     02/21/18 0903   Signed and Held  CBC  (enoxaparin (LOVENOX)    CrCl >/= 30 ml/min)  Once,   R    Comments:  Baseline for enoxaparin therapy IF NOT ALREADY DRAWN.  Notify MD if PLT < 100 K.    Signed and Held   Signed and Held  Creatinine, serum  (enoxaparin (LOVENOX)    CrCl >/= 30 ml/min)  Once,   R    Comments:  Baseline for enoxaparin therapy IF NOT ALREADY DRAWN.    Signed and Held   Signed and Held  Creatinine, serum  (enoxaparin (LOVENOX)    CrCl >/= 30 ml/min)  Weekly,   R    Comments:  while on enoxaparin therapy    Signed and Held   Signed and Held  Basic metabolic panel  Tomorrow morning,   R     Signed and Held   Signed and Held  TSH  Once,   R      Signed and Held   Signed and Held  Comprehensive metabolic panel  Tomorrow morning,   R     Signed and Held          Vitals/Pain Today's Vitals   02/21/18 0902 02/21/18 0930 02/21/18 1006 02/21/18 1030  BP:  111/75 96/82 120/67  Pulse:  67 66 71  Resp:  (!) 21 17 18   Temp:  (!) 89.2 F (31.8 C) (!) 90.8 F (32.7 C) (!) 91.8 F (33.2 C)  TempSrc:      SpO2:  97% 100% 98%  Weight: 54.4 kg     Height: 5\' 5"  (1.651 m)       Isolation Precautions No active isolations  Medications Medications  0.9 %  sodium chloride infusion (0 mLs Intravenous Stopped 02/21/18 1007)  metroNIDAZOLE (FLAGYL) IVPB 500 mg (500 mg Intravenous New Bag/Given 02/21/18 1011)  vancomycin (VANCOCIN) IVPB 1000 mg/200 mL premix (1,000 mg Intravenous New Bag/Given 02/21/18 1051)  ceFEPIme (MAXIPIME) 2 g in sodium chloride 0.9 % 100 mL IVPB (0 g Intravenous Stopped 02/21/18 1049)  Mobility non-ambulatory High fall risk   Focused Assessments    Recommendations: See Admitting Provider Note  Report given to:   Additional Notes:

## 2018-02-21 NOTE — ED Triage Notes (Signed)
PT to ER via EMS from home where she was found outside by a passerby.  Pt has hx of dementia.  Pt was found with clothing around her ankles and soiled with stool.  Pt clothing was all wet.  PT was combative and received 2mg  versed IM and 5mg  Haldol IV.

## 2018-02-21 NOTE — ED Notes (Signed)
Date and time results received: 02/21/18 9:52 AM    Test: Lactic/Troponin Critical Value: 5.4/0.12  Name of Provider Notified: Dr. Roxan Hockey

## 2018-02-21 NOTE — Progress Notes (Signed)
CRITICAL VALUE ALERT  Critical Value:  Troponin 1.36  Date & Time Notied:  02/21/2018 1900  Provider Notified: Dr Enedina Finner  Orders Received/Actions taken:  VSS. No signs of pain/chest pain.  NSR on tele. Notified MD. Per MD to order cardiology consult.

## 2018-02-21 NOTE — ED Provider Notes (Signed)
[facility] Emergency Department Provider Note    First MD Initiated Contact with Patient 02/21/18 [phone]     (approximate)  I have reviewed the triage vital signs and the nursing notes.   HISTORY  Chief Complaint Cold Exposure  Level V Caveat:  AMS, profound hypothermia  HPI Stephanie Graham is a 85 y.o. female   below listed past medical history presents altered via EMS found to be profoundly hypothermic after [place] [address] this [place].  Uncertain amount of time down.  Reported he lives at home with granddaughter.  Granddaughter was sleeping was unaware of how long she was out.  Unable to provide any additional history.  He is post medication via EMS that she was reportedly combative with them.  Likely secondary to underlying dementia however that does complicate the patient's presentation.   Past Medical History:  Diagnosis Date  . 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 Diabetes mellitus without complication (HCC)   . Hypertension    History reviewed. No pertinent family history. Past Surgical History:  Procedure Laterality Date  . REPLACEMENT TOTAL KNEE Left    Patient Active Problem List   Diagnosis Date Noted  . Hypothermia 02/21/2018  . 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 Syncope and collapse 01/18/2017  . HTN (hypertension) 01/18/2017  . Diabetes (HCC) 01/18/2017  . Nausea vomiting and diarrhea 01/18/2017      Prior to Admission medications   Medication Sig Start Date End Date Taking? Authorizing Provider  acetaminophen (TYLENOL) 650 MG CR tablet Take 1,300 mg by mouth every 8 (eight) hours as needed for pain.   Yes [provider]  aspirin 325 MG tablet Take 1 tablet (325 mg total) by mouth daily. 01/20/17  Yes Altamese Dilling, MD  carvedilol (COREG) 12.5 MG tablet Take 6.25 mg by mouth 2 (two) times daily.    Yes [provider]  donepezil (ARICEPT)  10 MG tablet Take 10 mg by mouth at bedtime. 02/06/16  Yes [provider]  losartan (COZAAR) 25 MG tablet Take 25 mg by mouth daily.   Yes [provider]  meloxicam (MOBIC) 7.5 MG tablet Take 7.5-15 mg by mouth daily as needed for pain.   Yes [provider]  metFORMIN (GLUCOPHAGE) 500 MG tablet Take 1,000 mg by mouth 2 (two) times daily.  02/08/16  Yes [provider]  pravastatin (PRAVACHOL) 40 MG tablet Take 80 mg by mouth at bedtime. 01/04/16  Yes [provider]  traZODone (DESYREL) 50 MG tablet Take 50 mg by mouth at bedtime as needed for sleep.   Yes [provider]    Allergies Patient has no known allergies.    Social History Social History   Tobacco Use  . Smoking status: Never Smoker  . Smokeless tobacco: Former Engineer, water Use Topics  . Alcohol use: No  . Drug use: No    Review of Systems Patient denies headaches, rhinorrhea, blurry vision, numbness, shortness of breath, chest pain, edema, cough, abdominal pain, nausea, vomiting, diarrhea, dysuria, fevers, rashes or hallucinations unless otherwise stated above in HPI. ____________________________________________   PHYSICAL EXAM:  VITAL SIGNS: Vitals:   02/21/18 1115 02/21/18 1151  BP: (!) 86/50 130/83  Pulse: 70 80  Resp: 17 (!) 21  Temp: (!) 93.2 F (34 C) (!) 94.6 F (34.8 C)  SpO2: 100% 93%    Constitutional: drowsy and with acute encephalopathy post IM versed  and haldol Eyes: Conjunctivae are normal.  Head: Atraumatic. Nose: No congestion/rhinnorhea. Mouth/Throat: Mucous membranes are dry  Neck: No stridor. Painless ROM.  Cardiovascular: Normal rate, regular rhythm. Grossly normal heart sounds.  Good peripheral circulation. Respiratory: Normal respiratory effort.  No retractions. Lungs CTAB. Gastrointestinal: Soft and nontender. No distention. No abdominal bruits. No CVA tenderness. Genitourinary: normal external genitalia Musculoskeletal:  No lower extremity tenderness nor edema.  No joint effusions. Neurologic: MAE spontaneously. Unable to cooperate with full exam 2/2 ams. No gross focal neurologic deficits are appreciated. No facial droop Skin:  Skin is cold, dry and intact. No rash noted. Psychiatric: unable to assess  ____________________________________________   LABS (all labs ordered are listed, but only abnormal results are displayed)  Results for orders placed or performed during the hospital encounter of 02/21/18 (from the past 24 hour(s))  Lactic acid, plasma     Status: Abnormal   Collection Time: 02/21/18  9:11 AM  Result Value Ref Range   Lactic Acid, Venous 5.4 (HH) 0.5 - 1.9 mmol/L  Comprehensive metabolic panel     Status: Abnormal   Collection Time: 02/21/18  9:11 AM  Result Value Ref Range   Sodium 140 135 - 145 mmol/L   Potassium 4.9 3.5 - 5.1 mmol/L   Chloride 107 98 - 111 mmol/L   CO2 21 (L) 22 - 32 mmol/L   Glucose, Bld 184 (H) 70 - 99 mg/dL   BUN 18 8 - 23 mg/dL   Creatinine, Ser 9.56 0.44 - 1.00 mg/dL   Calcium 8.7 (L) 8.9 - 10.3 mg/dL   Total Protein 7.3 6.5 - 8.1 g/dL   Albumin 4.1 3.5 - 5.0 g/dL   AST 34 15 - 41 U/L   ALT 20 0 - 44 U/L   Alkaline Phosphatase 57 38 - 126 U/L   Total Bilirubin 1.1 0.3 - 1.2 mg/dL   GFR calc non Af Amer 53 (L) >60 mL/min   GFR calc Af Amer >60 >60 mL/min   Anion gap 12 5 - 15  Troponin I - ONCE - STAT     Status: Abnormal   Collection Time: 02/21/18  9:11 AM  Result Value Ref Range   Troponin I 0.12 (HH) <0.03 ng/mL  CBC WITH DIFFERENTIAL     Status: Abnormal   Collection Time: 02/21/18  9:11 AM  Result Value Ref Range   WBC 8.8 4.0 - 10.5 K/uL   RBC 4.31 3.87 - 5.11 MIL/uL   Hemoglobin 13.1 12.0 - 15.0 g/dL   HCT 21.3 08.6 - 57.8 %   MCV 100.5 (H) 80.0 - 100.0 fL   MCH 30.4 26.0 - 34.0 pg   MCHC 30.3 30.0 - 36.0 g/dL   RDW 46.9 62.9 - 52.8 %   Platelets 157 150 - 400 K/uL   nRBC 0.0 0.0 - 0.2 %   Neutrophils Relative % 73 %   Neutro Abs 6.4  1.7 - 7.7 K/uL   Lymphocytes Relative 23 %   Lymphs Abs 2.0 0.7 - 4.0 K/uL   Monocytes Relative 2 %   Monocytes Absolute 0.2 0.1 - 1.0 K/uL   Eosinophils Relative 1 %   Eosinophils Absolute 0.1 0.0 - 0.5 K/uL   Basophils Relative 0 %   Basophils Absolute 0.0 0.0 - 0.1 K/uL   Immature Granulocytes 1 %   Abs Immature Granulocytes 0.04 0.00 - 0.07 K/uL  Urinalysis, Complete w Microscopic     Status: Abnormal   Collection Time: 02/21/18  9:11 AM  Result Value Ref Range   Color, Urine COLORLESS (A) YELLOW   APPearance CLEAR (A) CLEAR   Specific Gravity, Urine 1.008 1.005 - 1.030   pH 6.0 5.0 - 8.0   Glucose, UA 150 (A) NEGATIVE mg/dL   Hgb urine dipstick SMALL (A) NEGATIVE   Bilirubin Urine NEGATIVE NEGATIVE   Ketones, ur 5 (A) NEGATIVE mg/dL   Protein, ur NEGATIVE NEGATIVE mg/dL   Nitrite NEGATIVE NEGATIVE   Leukocytes, UA NEGATIVE NEGATIVE   RBC / HPF 0-5 0 - 5 RBC/hpf   WBC, UA 0-5 0 - 5 WBC/hpf   Bacteria, UA NONE SEEN NONE SEEN   Squamous Epithelial / LPF NONE SEEN 0 - 5   Hyaline Casts, UA PRESENT   Glucose, capillary     Status: Abnormal   Collection Time: 02/21/18 11:56 AM  Result Value Ref Range   Glucose-Capillary 111 (H) 70 - 99 mg/dL   ____________________________________________  EKG My review and personal interpretation at Time: 9:08   Indication: ams  Rate: 65  Rhythm: sinus Axis: normal Other: nonspecific st abn, no osborne wave or bradycardia ____________________________________________  RADIOLOGY  I personally reviewed all radiographic images ordered to evaluate for the above acute complaints and reviewed radiology reports and findings.  These findings were personally discussed with the patient.  Please see medical record for radiology report.  ____________________________________________   PROCEDURES  Procedure(s) performed:  .Critical Care Performed by: Willy Eddyobinson, Allie Ousley, MD Authorized by: Willy Eddyobinson, Saige Canton, MD   Critical care provider  statement:    Critical care time (minutes):  30   Critical care time was exclusive of:  Separately billable procedures and treating other patients   Critical care was necessary to treat or prevent imminent or life-threatening deterioration of the following conditions:  Metabolic crisis and dehydration   Critical care was time spent personally by me on the following activities:  Development of treatment plan with patient or surrogate, discussions with consultants, evaluation of patient's response to treatment, examination of patient, obtaining history from patient or surrogate, ordering and performing treatments and interventions, ordering and review of laboratory studies, ordering and review of radiographic studies, pulse oximetry, re-evaluation of patient's condition and review of old charts      Critical Care performed: yes ____________________________________________   INITIAL IMPRESSION / ASSESSMENT AND PLAN / ED COURSE  Pertinent labs & imaging results that were available during my care of the patient were reviewed by me and considered in my medical decision making (see chart for details).   DDX: Dehydration, sepsis, pna, uti, hypoglycemia, cva, drug effect, withdrawal, encephalitis   Stephanie Graham is a 85 y.o. who presents to the ED with symptoms as described above.  Profoundly hypothermic and altered.  Presentation complicated as she was sedated for agitation.  Blood work and radiographs and diagnostic imaging will be sent for the above differential.  Will be given IV fluids as well as placed on Bair hugger for warming.  Will start broad-spectrum antibiotics sepsis is on the differential.  Clinical Course as of Feb 22 1216  Sun Feb 21, 2018  0920 Core temp now up to 89 degrees.   [PR]  0950 Lactate and troponin elevated likely secondary to cold exposure possible underlying sepsis.  Does not seem clinically consistent with MI or ACS.   [PR]  1041 T head shows no evidence of  acute bleed.  Temperature continues to rise.  Has been given a liter bolus for her lactic acidosis will continue with IV hydration and additional IV  antibiotics being provided.  No clear source likely secondary to exposure so not certain that this presentation indicates need for continued antibiotics.  Discussed case with hospitalist who kindly agrees to admit patient to stepdown unit for further hemodynamic monitoring.  Have attempted to reach family to discuss CODE STATUS but have been unable to.   [PR]    Clinical Course User Index [PR] Willy Eddyobinson, Nikhita Mentzel, MD     As part of my medical decision making, I reviewed the following data within the electronic MEDICAL RECORD NUMBER Nursing notes reviewed and incorporated, Labs reviewed, notes from prior ED visits and [place] Controlled Substance Database   ____________________________________________   FINAL CLINICAL IMPRESSION(S) / ED DIAGNOSES  Final diagnoses:  Hypothermia, initial encounter  Lactic acidosis      NEW MEDICATIONS STARTED DURING THIS VISIT:  Current Discharge Medication List       Note:  This document was prepared using Dragon voice recognition software and may include unintentional dictation errors.    Willy Eddyobinson, Rhegan Trunnell, MD 02/21/18 1218

## 2018-02-21 NOTE — H&P (Signed)
[facility] [place] [facility]   PATIENT NAME: Stephanie Graham Penafiel    MR#:  [id number]  DATE OF BIRTH:  1921-05-23  DATE OF ADMISSION:  02/21/2018  PRIMARY CARE PHYSICIAN: Marisue IvanLinthavong, Kanhka, MD   REQUESTING/REFERRING PHYSICIAN: Dr. Roxan Hockeyobinson  CHIEF COMPLAINT:   Found outside by passerby HISTORY OF PRESENT ILLNESS:  Stephanie Graham Dickison  is a 85 y.o. female with a known history of advanced dementia who presents to the ER via EMS this morning when she was found outside by a passerby.  Patient was found with closing around her ankles and soiled with stool.  When EMS arrived patient was combative and received 2 mg Versed and 5 mg Haldol.  She is currently sedated.  When EMS arrived she was hypothermic.  She was brought to the ER for further evaluation.  She is currently sedated due to Versed and Haldol being given.  Her current temperature is 91 with bear hugger.  CT of the head shows no acute CVA.  It appears that perhaps patient was wandering due to her dementia.  Family has been contacted x2 however we have not received a phone call back.  She apparently lives with her family.  PAST MEDICAL HISTORY:   Past Medical History:  Diagnosis Date  . 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 Diabetes mellitus without complication (HCC)   . Hypertension     PAST SURGICAL HISTORY:   Past Surgical History:  Procedure Laterality Date  . REPLACEMENT TOTAL KNEE Left     SOCIAL HISTORY:   Social History   Tobacco Use  . Smoking status: Never Smoker  . Smokeless tobacco: Former Engineer, waterUser  Substance Use Topics  . Alcohol use: No    FAMILY HISTORY:  History reviewed. No pertinent family history.  DRUG ALLERGIES:  No Known Allergies  REVIEW OF SYSTEMS:   Review of Systems  Unable to perform ROS: Dementia    MEDICATIONS AT HOME:   Prior to Admission medications   Medication Sig Start Date End Date Taking? Authorizing Provider  acetaminophen  (TYLENOL) 650 MG CR tablet Take 1,300 mg by mouth every 8 (eight) hours as needed for pain.    [provider]  aspirin 325 MG tablet Take 1 tablet (325 mg total) by mouth daily. 01/20/17   Altamese DillingVachhani, Vaibhavkumar, MD  carvedilol (COREG) 12.5 MG tablet Take 12.5 mg by mouth 2 (two) times daily. 02/27/16   [provider]  donepezil (ARICEPT) 10 MG tablet Take 10 mg by mouth at bedtime. 02/06/16   [provider]  losartan (COZAAR) 25 MG tablet Take 25 mg by mouth daily.    [provider]  metFORMIN (GLUCOPHAGE) 500 MG tablet Take 1,000 mg by mouth 2 (two) times daily.  02/08/16   [provider]  pravastatin (PRAVACHOL) 40 MG tablet Take 80 mg by mouth at bedtime. 01/04/16   [provider]      VITAL SIGNS:  Blood pressure 96/82, pulse 66, temperature (!) 90.8 F (32.7 C), resp. rate 17, height 5\' 5"  (1.651 m), weight 54.4 kg, SpO2 100 %.  PHYSICAL EXAMINATION:   Physical Exam Constitutional:      General: She is not in acute distress.    Comments: Thin and frail bear hugger in place  HENT:     Head: Normocephalic.  Eyes:     General: No scleral icterus.    Pupils: Pupils are equal, round, and reactive to light.  Neck:  Musculoskeletal: Neck supple. No neck rigidity or muscular tenderness.     Vascular: No JVD.     Trachea: No tracheal deviation.  Cardiovascular:     Rate and Rhythm: Normal rate and regular rhythm.     Heart sounds: Normal heart sounds. No murmur. No friction rub. No gallop.   Pulmonary:     Effort: Pulmonary effort is normal. No respiratory distress.     Breath sounds: Normal breath sounds. No stridor. No wheezing, rhonchi or rales.  Chest:     Chest wall: No tenderness.  Abdominal:     General: Bowel sounds are normal. There is no distension.     Palpations: Abdomen is soft. There is no mass.     Tenderness: There is no abdominal tenderness. There is no guarding or rebound.  Skin:    General: Skin is  warm.     Findings: No erythema or rash.     Comments: Patient's skin is warm with bear hugger on  Neurological:     Comments: Lethargic  Psychiatric:     Comments: Patient with dementia and currently lethargic       LABORATORY PANEL:   CBC Recent Labs  Lab 02/21/18 0911  WBC 8.8  HGB 13.1  HCT 43.3  PLT 157   ------------------------------------------------------------------------------------------------------------------  Chemistries  Recent Labs  Lab 02/21/18 0911  NA 140  K 4.9  CL 107  CO2 21*  GLUCOSE 184*  BUN 18  CREATININE 0.92  CALCIUM 8.7*  AST 34  ALT 20  ALKPHOS 57  BILITOT 1.1   ------------------------------------------------------------------------------------------------------------------  Cardiac Enzymes Recent Labs  Lab 02/21/18 0911  TROPONINI 0.12*   ------------------------------------------------------------------------------------------------------------------  RADIOLOGY:  Dg Chest Port 1 View  Result Date: 02/21/2018 CLINICAL DATA:  Hypothermia EXAM: PORTABLE CHEST 1 VIEW COMPARISON:  01/18/2017 FINDINGS: Lungs are clear.  No pleural effusion or pneumothorax. Cardiomegaly. IMPRESSION: No evidence of acute cardiopulmonary disease. Electronically Signed   By: Charline BillsSriyesh  Krishnan M.D.   On: 02/21/2018 09:46    EKG:  Sinus rhythm no ST elevation or depression  IMPRESSION AND PLAN:   85 year old female with advanced dementia who presents to the ER with hypothermia  1.  Hypothermia: Patient was found outside of unknown time.  Etiology of hypothermia is due to being outside in the cold. Follow-up on blood cultures No antibiotics at this time as there is no source of infection Continue bear hugger Patient will be admitted to ICU and case will be discussed with intensivist on-call   2.  Elevated troponin: This is due to demand ischemia Follow troponins and telemetry monitoring  3.  Advanced dementia: Holding all medications until  patient is alert Patient may resume Aricept when awake  4.  Hypertension: Hydralazine PRN IV ordered  5.  History of diabetes: Sliding scale ordered   She needs case management/social worker for disposition planning  All the records are reviewed and case discussed with ED provider. Left message for family CODE STATUS: Full code as for now Critical care TOTAL TIME TAKING CARE OF THIS PATIENT: 52 minutes.    Jasara Corrigan M.D on 02/21/2018 at 10:23 AM  Between 7am to 6pm - Pager - [phone]  After 6pm go to [email or web address] - Social research officer, governmentpassword [email or web address]  [facility]  Office  [phone]  CC: Primary care physician; Marisue IvanLinthavong, Kanhka, MD

## 2018-02-21 NOTE — Consult Note (Signed)
Reason for Consult: Hypothermia Referring Physician: Dr. Crawford GivensMody  Stephanie Graham is an 85 y.o. female.  HPI: Stephanie Graham is an elderly 89 year old African-American female with a past medical history of hypertension, diabetes, visual impairment with severe advanced dementia who lives with her granddaughter.  Both her granddaughter and her daughter take care of her and shifts.  Apparently this morning when her granddaughter went to bed at 4 AM Stephanie Graham must of left the house and was found outside by around 830, by a passerby, EMS was called.  She was noted to have a close at her ankles, she was soiled and hypothermic along with agitation.  She was given 2 mg of Versed and 5 mg of Haldol.  In the emergency room she was noted to have a temperature of 91, was started on a bear hugger.  CT scan of the head did not reveal any acute CVA.  Pertinent labs reveal a lactic acid of 5.4, blood sugar was 184, troponin 0.12 UA was negative for infection, chest x-ray was negative for active infection, prominent cardiac silhouette  Past Medical History:  Diagnosis Date  . 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 Diabetes mellitus without complication (HCC)   . Hypertension     Past Surgical History:  Procedure Laterality Date  . REPLACEMENT TOTAL KNEE Left     History reviewed. No pertinent family history.  Social History:  reports that she has never smoked. She has quit using smokeless tobacco. She reports that she does not drink alcohol or use drugs.  Allergies: No Known Allergies  Medications: I have reviewed the patient's current medications.  Results for orders placed or performed during the hospital encounter of 02/21/18 (from the past 48 hour(s))  Lactic acid, plasma     Status: Abnormal   Collection Time: 02/21/18  9:11 AM  Result Value Ref Range   Lactic Acid, Venous 5.4 (HH) 0.5 - 1.9 mmol/L    Comment: CRITICAL RESULT CALLED TO, READ BACK BY AND VERIFIED  WITH JENNIFER WHITLEY ON 02/21/2018 AT 0941 QSD Performed at [facility], [address] [place], [place] [address]   Comprehensive metabolic panel     Status: Abnormal   Collection Time: 02/21/18  9:11 AM  Result Value Ref Range   Sodium 140 135 - 145 mmol/L   Potassium 4.9 3.5 - 5.1 mmol/L    Comment: HEMOLYSIS AT THIS LEVEL MAY AFFECT RESULT   Chloride 107 98 - 111 mmol/L   CO2 21 (L) 22 - 32 mmol/L   Glucose, Bld 184 (H) 70 - 99 mg/dL   BUN 18 8 - 23 mg/dL   Creatinine, Ser 6.570.92 0.44 - 1.00 mg/dL   Calcium 8.7 (L) 8.9 - 10.3 mg/dL   Total Protein 7.3 6.5 - 8.1 g/dL   Albumin 4.1 3.5 - 5.0 g/dL   AST 34 15 - 41 U/L    Comment: HEMOLYSIS AT THIS LEVEL MAY AFFECT RESULT   ALT 20 0 - 44 U/L   Alkaline Phosphatase 57 38 - 126 U/L   Total Bilirubin 1.1 0.3 - 1.2 mg/dL    Comment: HEMOLYSIS AT THIS LEVEL MAY AFFECT RESULT   GFR calc non Af Amer 53 (L) >60 mL/min   GFR calc Af Amer >60 >60 mL/min   Anion gap 12 5 - 15    Comment: Performed at [facility], [address] [place], [place] [address]  Troponin I - ONCE - STAT  Status: Abnormal   Collection Time: 02/21/18  9:11 AM  Result Value Ref Range   Troponin I 0.12 (HH) <0.03 ng/mL    Comment: CRITICAL RESULT CALLED TO, READ BACK BY AND VERIFIED WITH JENNIFER WHITLEY ON 02/21/2018 AT 9485 QSD Performed at [facility], [address] [place], [place] [address]   CBC WITH DIFFERENTIAL     Status: Abnormal   Collection Time: 02/21/18  9:11 AM  Result Value Ref Range   WBC 8.8 4.0 - 10.5 K/uL   RBC 4.31 3.87 - 5.11 MIL/uL   Hemoglobin 13.1 12.0 - 15.0 g/dL   HCT 35.0 09.3 - 81.8 %   MCV 100.5 (H) 80.0 - 100.0 fL   MCH 30.4 26.0 - 34.0 pg   MCHC 30.3 30.0 - 36.0 g/dL   RDW 29.9 37.1 - 69.6 %   Platelets 157 150 - 400 K/uL   nRBC 0.0 0.0 - 0.2 %   Neutrophils Relative % 73 %   Neutro Abs 6.4 1.7 - 7.7 K/uL   Lymphocytes Relative 23 %   Lymphs Abs 2.0 0.7 - 4.0 K/uL   Monocytes Relative 2  %   Monocytes Absolute 0.2 0.1 - 1.0 K/uL   Eosinophils Relative 1 %   Eosinophils Absolute 0.1 0.0 - 0.5 K/uL   Basophils Relative 0 %   Basophils Absolute 0.0 0.0 - 0.1 K/uL   Immature Granulocytes 1 %   Abs Immature Granulocytes 0.04 0.00 - 0.07 K/uL    Comment: Performed at [facility], [address] [place], [place] [address]  Urinalysis, Complete w Microscopic     Status: Abnormal   Collection Time: 02/21/18  9:11 AM  Result Value Ref Range   Color, Urine COLORLESS (A) YELLOW   APPearance CLEAR (A) CLEAR   Specific Gravity, Urine 1.008 1.005 - 1.030   pH 6.0 5.0 - 8.0   Glucose, UA 150 (A) NEGATIVE mg/dL   Hgb urine dipstick SMALL (A) NEGATIVE   Bilirubin Urine NEGATIVE NEGATIVE   Ketones, ur 5 (A) NEGATIVE mg/dL   Protein, ur NEGATIVE NEGATIVE mg/dL   Nitrite NEGATIVE NEGATIVE   Leukocytes, UA NEGATIVE NEGATIVE   RBC / HPF 0-5 0 - 5 RBC/hpf   WBC, UA 0-5 0 - 5 WBC/hpf   Bacteria, UA NONE SEEN NONE SEEN   Squamous Epithelial / LPF NONE SEEN 0 - 5   Hyaline Casts, UA PRESENT     Comment: Performed at [facility], [address] [place], [place] [address]  Glucose, capillary     Status: Abnormal   Collection Time: 02/21/18 11:56 AM  Result Value Ref Range   Glucose-Capillary 111 (H) 70 - 99 mg/dL    Ct Head Wo Contrast  Result Date: 02/21/2018 CLINICAL DATA:  Altered mental status. EXAM: CT HEAD WITHOUT CONTRAST TECHNIQUE: Contiguous axial images were obtained from the base of the skull through the vertex without intravenous contrast. COMPARISON:  CT scan of January 18, 2017. FINDINGS: Brain: Mild chronic ischemic white matter disease is noted. No mass effect or midline shift is noted. Ventricular size is within normal limits. There is no evidence of mass lesion, hemorrhage or acute infarction. Vascular: No hyperdense vessel or unexpected calcification. Skull: Normal. Negative for fracture or focal lesion. Sinuses/Orbits: Left maxillary and ethmoid  sinusitis is noted. Other: None. IMPRESSION: Mild chronic ischemic white matter disease. No acute intracranial abnormality seen. Electronically Signed   By: Lupita Raider, M.D.   On: 02/21/2018 10:21  Dg Pelvis Portable  Result Date: 02/21/2018 CLINICAL DATA:  Altered mental status, combative EXAM: PORTABLE PELVIS 1-2 VIEWS COMPARISON:  None. FINDINGS: No fracture or dislocation is seen. Bilateral hip joint spaces are symmetric. Visualized bony pelvis appears intact. Degenerative changes of the lower lumbar spine. IMPRESSION: Negative. Electronically Signed   By: Charline Bills M.D.   On: 02/21/2018 11:01   Dg Chest Port 1 View  Result Date: 02/21/2018 CLINICAL DATA:  Hypothermia EXAM: PORTABLE CHEST 1 VIEW COMPARISON:  01/18/2017 FINDINGS: Lungs are clear.  No pleural effusion or pneumothorax. Cardiomegaly. IMPRESSION: No evidence of acute cardiopulmonary disease. Electronically Signed   By: Charline Bills M.D.   On: 02/21/2018 09:46    ROS  Unable to obtain Blood pressure 130/83, pulse 84, temperature (!) 95.2 F (35.1 C), temperature source Bladder, resp. rate (!) 23, height 5\' 2"  (1.575 m), weight 58 kg, SpO2 98 %. Physical Exam General: Elderly appearing female.  Presently under a warming blanket, now awake and interactive and communicating.  Family are present HEENT: Edentulous, trachea midline, no accessory muscle utilization, flat neck veins Cardiovascular: Regular rate and rhythm Pulmonary: Clear to auscultation Abdominal: Positive bowel sounds, soft exam Extremities: No clubbing, cyanosis or edema noted Neurologic: Patient is somnolent but arousable, interacts, confused.  Moves all extremities Cutaneous: Dry mucosa  Assessment/Plan:  Hypothermia.  Most likely secondary to exposure after leaving her home.  Patient being rewarmed.  No clinical evidence of active infection  Dehydration.  Patient been rehydrated with saline.  Most likely etiology of elevated lactic acid is  hypoperfusion.  Improving hemodynamics  Elevated troponin.  0.12.  On EKG patient does have apparent history of old septal infarct with nonspecific ST-T wave changes.  Acid-base derangement.  Patient went non-anion gap metabolic acidosis  Hyperglycemia.  Scale coverage  Severe advanced dementia.  Patient may benefit from neurologic evaluation tomorrow    Redina Zeller 02/21/2018, 12:42 PM

## 2018-02-22 LAB — COMPREHENSIVE METABOLIC PANEL
ALT: 19 U/L (ref 0–44)
AST: 28 U/L (ref 15–41)
Albumin: 3.1 g/dL — ABNORMAL LOW (ref 3.5–5.0)
Alkaline Phosphatase: 42 U/L (ref 38–126)
Anion gap: 6 (ref 5–15)
BUN: 14 mg/dL (ref 8–23)
CO2: 27 mmol/L (ref 22–32)
Calcium: 8.1 mg/dL — ABNORMAL LOW (ref 8.9–10.3)
Chloride: 109 mmol/L (ref 98–111)
Creatinine, Ser: 0.88 mg/dL (ref 0.44–1.00)
GFR calc Af Amer: >60 mL/min (ref >60)
GFR calc non Af Amer: 56 mL/min — ABNORMAL LOW (ref >60)
Glucose, Bld: 96 mg/dL (ref 70–99)
POTASSIUM: 3.7 mmol/L (ref 3.5–5.1)
Sodium: 142 mmol/L (ref 135–145)
Total Bilirubin: 0.8 mg/dL (ref 0.3–1.2)
Total Protein: 5.9 g/dL — ABNORMAL LOW (ref 6.5–8.1)

## 2018-02-22 LAB — URINE CULTURE: CULTURE: NO GROWTH

## 2018-02-22 LAB — GLUCOSE, CAPILLARY
GLUCOSE-CAPILLARY: 94 mg/dL (ref 70–99)
Glucose-Capillary: 131 mg/dL — ABNORMAL HIGH (ref 70–99)
Glucose-Capillary: 123 mg/dL — ABNORMAL HIGH (ref 70–99)
Glucose-Capillary: 72 mg/dL (ref 70–99)

## 2018-02-22 MED ORDER — TRAZODONE HCL 50 MG PO TABS: 50.0000 mg | ORAL_TABLET | Freq: Every evening | ORAL | Status: DC | PRN

## 2018-02-22 MED ORDER — QUETIAPINE FUMARATE 25 MG PO TABS
50.0000 mg | ORAL_TABLET | Freq: Every day | ORAL | Status: DC
  Administered 2018-02-22 – 2018-02-23 (×2): 50 mg via ORAL
  Filled 2018-02-22 (×3): qty 2

## 2018-02-22 MED ORDER — ASPIRIN 325 MG PO TABS
325.0000 mg | ORAL_TABLET | Freq: Every day | ORAL | Status: DC
  Administered 2018-02-22 – 2018-02-24 (×3): 325 mg via ORAL
  Filled 2018-02-22 (×3): qty 1

## 2018-02-22 MED ORDER — PRAVASTATIN SODIUM 20 MG PO TABS
80.0000 mg | ORAL_TABLET | Freq: Every day | ORAL | Status: DC
  Administered 2018-02-22 – 2018-02-23 (×2): 80 mg via ORAL
  Filled 2018-02-22 (×3): qty 4

## 2018-02-22 MED ORDER — DONEPEZIL HCL 5 MG PO TABS
10.0000 mg | ORAL_TABLET | Freq: Every day | ORAL | Status: DC
  Administered 2018-02-22 – 2018-02-23 (×2): 10 mg via ORAL
  Filled 2018-02-22 (×3): qty 2

## 2018-02-22 NOTE — Progress Notes (Signed)
[facility] [place] [facility]   PATIENT NAME: Stephanie Graham    MR#:  [id number]  DATE OF BIRTH:  1921/05/02  SUBJECTIVE:   Patient states she feels fine this morning.  She denies any cough, chest pain, shortness of breath, dysuria, urinary frequency.  REVIEW OF SYSTEMS:  Review of Systems  Constitutional: Negative for chills and fever.  HENT: Negative for congestion and sore throat.   Eyes: Negative for blurred vision and double vision.  Respiratory: Negative for cough and shortness of breath.   Cardiovascular: Negative for chest pain and palpitations.  Gastrointestinal: Negative for nausea and vomiting.  Genitourinary: Negative for dysuria and urgency.  Musculoskeletal: Negative for back pain and neck pain.  Neurological: Negative for dizziness and headaches.  Psychiatric/Behavioral: Negative for depression. The patient is not nervous/anxious.     DRUG ALLERGIES:  No Known Allergies VITALS:  Blood pressure (!) 81/64, pulse 72, temperature 98.1 F (36.7 C), temperature source Oral, resp. rate 20, height 5\' 3"  (1.6 m), weight 56.8 kg, SpO2 96 %. PHYSICAL EXAMINATION:  Physical Exam  Constitutional:   Laying in bed in no acute distress HEENT: Normocephalic, atraumatic, EOMI, no scleral icterus, moist mucous membranes Neck: Supple, full range of motion, no JVD Cardiovascular: RRR, no murmurs, rubs, gallops Pulmonary: Lungs clear to auscultation bilaterally, no crackles or wheezes, normal work of breathing Abdominal: +BS, soft, nontender, nondistended Skin: Skin is warm and dry, no rashes or lesions Neurological: Cranial nerves II through XII grossly intact, moving all extremities, + global weakness, sensation intact throughout, no focal deficits Psychiatric: Awake and oriented x2, appropriate affect LABORATORY PANEL:  Female CBC Recent Labs  Lab 02/21/18 0911  WBC 8.8  HGB 13.1  HCT 43.3  PLT 157    ------------------------------------------------------------------------------------------------------------------ Chemistries  Recent Labs  Lab 02/22/18 0406  NA 142  K 3.7  CL 109  CO2 27  GLUCOSE 96  BUN 14  CREATININE 0.88  CALCIUM 8.1*  AST 28  ALT 19  ALKPHOS 42  BILITOT 0.8   RADIOLOGY:  Dg Ankle Complete Left  Result Date: 02/21/2018 CLINICAL DATA:  Ankle pain EXAM: LEFT ANKLE COMPLETE - 3+ VIEW COMPARISON:  None. FINDINGS: No evidence for an acute fracture. No subluxation or dislocation. Degenerative changes are seen in the tibiotalar joint. Bones are demineralized. IMPRESSION: Degenerative changes without acute bony abnormality. Electronically Signed   By: Kennith Center M.D.   On: 02/21/2018 19:39   ASSESSMENT AND PLAN:   1.  Hypothermia- likely due to being outside for unknown period of time.  Hypothermia has resolved with bair hugger. -Follow-up blood cultures -Headaches, as she does not have any signs of infection -PT consult  2.  Elevated troponin: Likely due to demand ischemia.  No active chest pain. -Cardiology consult  3.  Advanced dementia: Patient is more alert this morning. -Continue home Aricept  4.  Hypertension: BP soft today -Hold home coreg and losartan for now  5.  Type 2 diabetes -Continue SSI  All the records are reviewed and case discussed with Care Management/Social Worker. Management plans discussed with the patient, family and they are in agreement.  CODE STATUS: Full Code  TOTAL TIME TAKING CARE OF THIS PATIENT: 40 minutes.   More than 50% of the time was spent in counseling/coordination of care: YES  POSSIBLE D/C IN 1-2 days, DEPENDING ON CLINICAL CONDITION.   Jinny Blossom Mayo M.D on 02/22/2018 at 1:11 PM  Between 7am to 6pm - Pager - [phone]  After 6pm go to [email or web address] - Social research officer, government  [facility]  Office  [phone]  CC: Primary care physician; Marisue Ivan, MD  Note:  This dictation was prepared with Dragon dictation along with smaller phrase technology. Any transcriptional errors that result from this process are unintentional.

## 2018-02-22 NOTE — Progress Notes (Signed)
PT Cancellation Note  Patient Details Name: Stephanie Graham MRN: [id number] DOB: [birth year]   Cancelled Treatment:     Patient refused getting up right now, asks that I return later. She is resting. Will re-attempt later today if time allows.    Datrell Dunton 02/22/2018, 11:23 AM

## 2018-02-22 NOTE — Evaluation (Signed)
Clinical/Bedside Swallow Evaluation Patient Details  Name: Rollene RotundaCatherine Macinnes MRN: [id number] Date of Birth: [birth year]  Today's Date: 02/22/2018 Time: SLP Start Time (ACUTE ONLY): 1505 SLP Stop Time (ACUTE ONLY): 1529 SLP Time Calculation (min) (ACUTE ONLY): 24 min  Past Medical History:  Past Medical History:  Diagnosis Date  . 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 Diabetes mellitus without complication (HCC)   . Hypertension    Past Surgical History:  Past Surgical History:  Procedure Laterality Date  . REPLACEMENT TOTAL KNEE Left    HPI:  Per admitting H&P: this morning when she was found outside by a passerby.  Patient was found with closing around her ankles and soiled with stool.  When EMS arrived patient was combative and received 2 mg Versed and 5 mg Haldol.  She is currently sedated.  When EMS arrived she was hypothermic.  She was brought to the ER for further evaluation.  She is currently sedated due to Versed and Haldol being given.  Her current temperature is 91 with bear hugger.  CT of the head shows no acute CVA.  It appears that perhaps patient was wandering due to her dementia.  Family has been contacted x2 however we have not received a phone call back.  She apparently lives with her family.   Assessment / Plan / Recommendation Clinical Impression  This pleasant 85 y/o female appears to present with functional swallowing abilities at bedside.  No overt s/s aspiration observed with any consistency tested (thin liquid, puree, solids).  Oral phase of swallow and oral mech exam WFL. Adequate oral prep/coordination and A-P transit time with all consistencies tested (thin, puree, solid). No oral residue observed. No overt s/s pharyngeal dysphagia observed. Swallow initiation appeared timely. Vocal quality remained clear thoughout evaluation. Pt easily fed self; however pt HIGHLY impulsive, attempting to feed herself tsp after tsp puree despite SLP  verbal cues to slow down. Pt will need FULL supervision with all PO intake to reduce risk of aspiration, due lack of safety awareness and inability to independently follow safe swallow rec's. Nsg reports toleration of currrent regular diet with no s/s aspiration. Discussed safe swallow precautions with nsg & need for FULL supervision to decrease risk of aspiration and to ensure pt consumes PO's at a slow pace. Nsg stated agreement. Recommend continue with current regular diet with thin liquids, may continue to give meds whole with thin liquid. SLP to sign off at this time, no needs identified. Please re-consult with any future change in status requiring reassessment. SLP Visit Diagnosis: Dysphagia, unspecified (R13.10)    Aspiration Risk  Mild aspiration risk    Diet Recommendation Regular;Thin liquid   Liquid Administration via: Cup Medication Administration: Whole meds with liquid Supervision: Full supervision/cueing for compensatory strategies Compensations: Minimize environmental distractions;Slow rate;Small sips/bites Postural Changes: Seated upright at 90 degrees    Other  Recommendations Oral Care Recommendations: Oral care before and after PO   Follow up Recommendations None      Frequency and Duration            Prognosis Prognosis for Safe Diet Advancement: Good Barriers to Reach Goals: Cognitive deficits      Swallow Study   General Date of Onset: 02/22/18 HPI: Per admitting H&P: this morning when she was found outside by a passerby.  Patient was found with closing around her ankles and soiled with stool.  When EMS arrived patient was combative and received 2 mg Versed and 5  mg Haldol.  She is currently sedated.  When EMS arrived she was hypothermic.  She was brought to the ER for further evaluation.  She is currently sedated due to Versed and Haldol being given.  Her current temperature is 91 with bear hugger.  CT of the head shows no acute CVA.  It appears that perhaps  patient was wandering due to her dementia.  Family has been contacted x2 however we have not received a phone call back.  She apparently lives with her family. Type of Study: Bedside Swallow Evaluation Diet Prior to this Study: Regular;Thin liquids Temperature Spikes Noted: No Respiratory Status: Room air History of Recent Intubation: No Behavior/Cognition: Alert;Cooperative;Pleasant mood;Requires cueing Oral Cavity Assessment: Within Functional Limits Oral Care Completed by SLP: No Oral Cavity - Dentition: Dentures, top;Dentures, bottom Vision: Functional for self-feeding Self-Feeding Abilities: Needs assist Patient Positioning: Upright in chair Baseline Vocal Quality: Normal Volitional Cough: Strong Volitional Swallow: Unable to elicit    Oral/Motor/Sensory Function Overall Oral Motor/Sensory Function: Within functional limits   Ice Chips Ice chips: Within functional limits   Thin Liquid Thin Liquid: Within functional limits    Nectar Thick Nectar Thick Liquid: Not tested   Honey Thick Honey Thick Liquid: Not tested   Puree Puree: Within functional limits   Solid     Solid: Within functional limits      Jalaysia Lobb, MA, CCC-SLP 02/22/2018,3:34 PM

## 2018-02-22 NOTE — Evaluation (Signed)
Physical Therapy Evaluation Patient Details Name: Georgean Foltyn MRN: [id number] DOB: 1921-07-31 Today's Date: 02/22/2018   History of Present Illness  Patient admitted when found outside on the ground by passerby. She was combative with EMS at admission. Patient has h/o advanced dementia, DM, HTN, blind in right eye.    Clinical Impression  Patient sleeping upon arrival, agrees to get up to recliner to eat lunch that is sitting in room. Patient performed bed mobility independently. Requires min guard assist for sit to stand transfer with rw. Able to ambulate 5 feet with rw and min guard assist, difficulty with weight bearing on left LE due to pain. Patient will benefit from skilled PT to address her weakness and difficulty with mobility.     Follow Up Recommendations SNF    Equipment Recommendations  None recommended by PT    Recommendations for Other Services       Precautions / Restrictions Precautions Precautions: Fall Restrictions Weight Bearing Restrictions: No      Mobility  Bed Mobility Overal bed mobility: Modified Independent             General bed mobility comments: use of bedrails  Transfers   Equipment used: Rolling walker (2 wheeled)                Ambulation/Gait Ambulation/Gait assistance: Modified independent (Device/Increase time);Min guard Gait Distance (Feet): 5 Feet Assistive device: Rolling walker (2 wheeled) Gait Pattern/deviations: Step-to pattern;Decreased weight shift to left;Decreased step length - right;Decreased step length - left;Shuffle     General Gait Details: limited by pain in left foot reported with weight bearing  Stairs            Wheelchair Mobility    Modified Rankin (Stroke Patients Only)       Balance Overall balance assessment: Modified Independent;Mild deficits observed, not formally tested                                           Pertinent Vitals/Pain Pain Assessment:  Faces Faces Pain Scale: Hurts a little bit Pain Location: left foot Pain Descriptors / Indicators: Aching Pain Intervention(s): Monitored during session    Home Living Family/patient expects to be discharged to:: Private residence Living Arrangements: Other relatives Available Help at Discharge: Family             Additional Comments: patient has diagnosis of advanced dementia, therefore unsure of home environment. No family present    Prior Function Level of Independence: Independent with assistive device(s)               Hand Dominance        Extremity/Trunk Assessment   Upper Extremity Assessment Upper Extremity Assessment: Overall WFL for tasks assessed;Generalized weakness    Lower Extremity Assessment Lower Extremity Assessment: Overall WFL for tasks assessed;Generalized weakness       Communication   Communication: HOH  Cognition Arousal/Alertness: Awake/alert Behavior During Therapy: WFL for tasks assessed/performed Overall Cognitive Status: Within Functional Limits for tasks assessed                                        General Comments      Exercises     Assessment/Plan    PT Assessment Patient needs continued PT services  PT Problem List Decreased  strength;Decreased mobility;Decreased balance;Decreased cognition;Pain;Decreased activity tolerance;Decreased safety awareness       PT Treatment Interventions DME instruction;Functional mobility training;Balance training;Patient/family education;Gait training;Therapeutic activities;Neuromuscular re-education;Therapeutic exercise    PT Goals (Current goals can be found in the Care Plan section)  Acute Rehab PT Goals Patient Stated Goal: patient unable to state goals, no family present PT Goal Formulation: With patient Time For Goal Achievement: 03/08/18 Potential to Achieve Goals: Fair    Frequency Min 2X/week   Barriers to discharge Decreased caregiver support       Co-evaluation               AM-PAC PT "6 Clicks" Mobility  Outcome Measure Help needed turning from your back to your side while in a flat bed without using bedrails?: A Little Help needed moving from lying on your back to sitting on the side of a flat bed without using bedrails?: A Little Help needed moving to and from a bed to a chair (including a wheelchair)?: A Little Help needed standing up from a chair using your arms (e.g., wheelchair or bedside chair)?: A Little Help needed to walk in hospital room?: A Little Help needed climbing 3-5 steps with a railing? : A Lot 6 Click Score: 17    End of Session Equipment Utilized During Treatment: Gait belt Activity Tolerance: Patient tolerated treatment well Patient left: in chair;with call bell/phone within reach Nurse Communication: Mobility status;Other (comment)(patient has no call bell available in room.  ) PT Visit Diagnosis: Muscle weakness (generalized) (M62.81);Unsteadiness on feet (R26.81);History of falling (Z91.81);Difficulty in walking, not elsewhere classified (R26.2)    Time: 1400-1420 PT Time Calculation (min) (ACUTE ONLY): 20 min   Charges:   PT Evaluation $PT Eval Low Complexity: 1 Low          Neasia Fleeman, PT, GCS 02/22/18,2:35 PM

## 2018-02-22 NOTE — [place] FL2 (Signed)
Combine MEDICAID FL2 LEVEL OF CARE SCREENING TOOL     IDENTIFICATION  Patient Name: Stephanie Graham Birthdate: 1921-09-21 Sex: female Admission Date (Current Location): 02/21/2018  [place] and [place] Number:  Chiropodist and Address:  [facility], [address] [place], [place] [address]      Provider Number: [id number]  Attending Physician Name and Address:  Campbell Stall, MD  Relative Name and Phone Number:       Current Level of Care: Hospital Recommended Level of Care: Skilled Nursing Facility Prior Approval Number:    Date Approved/Denied:   PASRR Number: [id number]  Discharge Plan: SNF    Current Diagnoses: Patient Active Problem List   Diagnosis Date Noted  . Hypothermia 02/21/2018  . 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 Syncope and collapse 01/18/2017  . HTN (hypertension) 01/18/2017  . Diabetes (HCC) 01/18/2017  . Nausea vomiting and diarrhea 01/18/2017    Orientation RESPIRATION BLADDER Height & Weight     Self  Normal Continent Weight: 125 lb 4.8 oz (56.8 kg) Height:  5\' 3"  (160 cm)  BEHAVIORAL SYMPTOMS/MOOD NEUROLOGICAL BOWEL NUTRITION STATUS  (none) (none) Continent Diet(Dysphagia 2 )  AMBULATORY STATUS COMMUNICATION OF NEEDS Skin   Extensive Assist Verbally Normal                       Personal Care Assistance Level of Assistance  Bathing, Feeding, Dressing Bathing Assistance: Limited assistance Feeding assistance: Independent Dressing Assistance: Limited assistance     Functional Limitations Info  Sight, Hearing, Speech Sight Info: Adequate Hearing Info: Adequate Speech Info: Adequate    SPECIAL CARE FACTORS FREQUENCY  PT (By licensed PT), OT (By licensed OT)     PT Frequency: 5 OT Frequency: 5            Contractures Contractures Info: Not present    Additional Factors Info  Allergies, Code Status Code Status Info: Full Code  Allergies Info: NKA           Current  Medications (02/22/2018):  This is the current hospital active medication list Current Facility-Administered Medications  Medication Dose Route Frequency Provider Last Rate Last Dose  . 0.9 %  sodium chloride infusion   Intravenous Continuous Adrian Saran, MD 100 mL/hr at 02/22/18 0900    . acetaminophen (TYLENOL) tablet 650 mg  650 mg Oral Q6H PRN Adrian Saran, MD       Or  . acetaminophen (TYLENOL) suppository 650 mg  650 mg Rectal Q6H PRN Mody, Sital, MD      . aspirin tablet 325 mg  325 mg Oral Daily Mayo, Allyn Kenner, MD   325 mg at 02/22/18 1335  . donepezil (ARICEPT) tablet 10 mg  10 mg Oral QHS Mayo, Allyn Kenner, MD      . enoxaparin (LOVENOX) injection 40 mg  40 mg Subcutaneous Q24H Adrian Saran, MD   40 mg at 02/21/18 2114  . insulin aspart (novoLOG) injection 0-9 Units  0-9 Units Subcutaneous TID WC Adrian Saran, MD   1 Units at 02/22/18 1202  . mupirocin ointment (BACTROBAN) 2 % 1 application  1 application Nasal BID Conforti, John, DO   1 application at 02/22/18 0859  . ondansetron (ZOFRAN) tablet 4 mg  4 mg Oral Q6H PRN Mody, Sital, MD       Or  . ondansetron (ZOFRAN) injection 4 mg  4 mg Intravenous Q6H PRN Adrian Saran, MD      . polyethylene glycol (  MIRALAX / GLYCOLAX) packet 17 g  17 g Oral Daily PRN Mody, Sital, MD      . pravastatin (PRAVACHOL) tablet 80 mg  80 mg Oral QHS Mayo, Allyn Kenner, MD      . QUEtiapine (SEROQUEL) tablet 50 mg  50 mg Oral QHS Mayo, Allyn Kenner, MD      . traZODone (DESYREL) tablet 50 mg  50 mg Oral QHS PRN Mayo, Allyn Kenner, MD         Discharge Medications: Please see discharge summary for a list of discharge medications.  Relevant Imaging Results:  Relevant Lab Results:   Additional Information SSN: [id number]  Ruthe Mannan, [place]

## 2018-02-22 NOTE — Clinical Social Work Note (Signed)
Clinical Social Work Assessment  Patient Details  Name: Stephanie Graham MRN: [id number] Date of Birth: 08-Oct-1921  Date of referral:  02/22/18               Reason for consult:  Facility Placement                Permission sought to share information with:  Case Manager, Customer service manager, Family Supports Permission granted to share information::  Yes, Verbal Permission Granted  Name::      SNF  Agency::   [facility]   Relationship::     Contact Information:     Housing/Transportation Living arrangements for the past 2 months:  Single Family Home Source of Information:  Adult Children Patient Interpreter Needed:  None Criminal Activity/Legal Involvement Pertinent to Current Situation/Hospitalization:  No - Comment as needed Significant Relationships:  Adult Children, Other Family Members Lives with:  Relatives Do you feel safe going back to the place where you live?  Yes Need for family participation in patient care:  Yes (Comment)  Care giving concerns:  Patient lives with granddaughter in [place] Worker assessment / plan:  CSW consulted for SNF placement. CSW met with patient to assess but she is confused and unable to complete assessment. CSW spoke with patient's daughter Stephanie Graham [phone]. Daughter reports that patient lives with her granddaughter and has nurses that come in to help her. Per daughter, they believe that SNF for rehab would be a good idea. CSW will begin bed search and give bed offers once received. CSW will continue to follow for discharge planning.   Employment status:  Retired Forensic scientist:  [employer] PT Recommendations:  [place] / Referral to community resources:  [place]  Patient/Family's Response to care:  Daughter thanked CSW for assistance   Patient/Family's Understanding of and Emotional Response to Diagnosis, Current Treatment, and Prognosis:  Daughter  is aware of current diagnoses and treatment.   Emotional Assessment Appearance:  Appears stated age Attitude/Demeanor/Rapport:  Lethargic Affect (typically observed):  Withdrawn Orientation:  Oriented to Self Alcohol / Substance use:  Not Applicable Psych involvement (Current and /or in the community):  No (Comment)  Discharge Needs  Concerns to be addressed:  Discharge Planning Concerns Readmission within the last 30 days:  No Current discharge risk:  Dependent with Mobility, Cognitively Impaired Barriers to Discharge:  Continued Medical Work up   [employer], [place] 02/22/2018, 3:17 PM

## 2018-02-23 LAB — CBC
HCT: 33.3 % — ABNORMAL LOW (ref 36.0–46.0)
Hemoglobin: 10.6 g/dL — ABNORMAL LOW (ref 12.0–15.0)
MCH: 30.7 pg (ref 26.0–34.0)
MCHC: 31.8 g/dL (ref 30.0–36.0)
MCV: 96.5 fL (ref 80.0–100.0)
Platelets: 170 10*3/uL (ref 150–400)
RBC: 3.45 MIL/uL — ABNORMAL LOW (ref 3.87–5.11)
RDW: 13.8 % (ref 11.5–15.5)
WBC: 5.3 10*3/uL (ref 4.0–10.5)
nRBC: 0 % (ref 0.0–0.2)

## 2018-02-23 LAB — BASIC METABOLIC PANEL
Anion gap: 6 (ref 5–15)
BUN: 8 mg/dL (ref 8–23)
CO2: 26 mmol/L (ref 22–32)
Calcium: 8.3 mg/dL — ABNORMAL LOW (ref 8.9–10.3)
Chloride: 111 mmol/L (ref 98–111)
Creatinine, Ser: 0.66 mg/dL (ref 0.44–1.00)
GFR calc Af Amer: >60 mL/min (ref >60)
GFR calc non Af Amer: >60 mL/min (ref >60)
GLUCOSE: 105 mg/dL — AB (ref 70–99)
Potassium: 3.1 mmol/L — ABNORMAL LOW (ref 3.5–5.1)
Sodium: 143 mmol/L (ref 135–145)

## 2018-02-23 LAB — GLUCOSE, CAPILLARY
GLUCOSE-CAPILLARY: 136 mg/dL — AB (ref 70–99)
Glucose-Capillary: 103 mg/dL — ABNORMAL HIGH (ref 70–99)
Glucose-Capillary: 232 mg/dL — ABNORMAL HIGH (ref 70–99)
Glucose-Capillary: 126 mg/dL — ABNORMAL HIGH (ref 70–99)

## 2018-02-23 LAB — MAGNESIUM: Magnesium: 1.7 mg/dL (ref 1.7–2.4)

## 2018-02-23 MED ORDER — HALOPERIDOL LACTATE 5 MG/ML IJ SOLN
2.0000 mg | Freq: Once | INTRAMUSCULAR | Status: AC
  Administered 2018-02-24: 2 mg via INTRAVENOUS
  Filled 2018-02-23: qty 1

## 2018-02-23 MED ORDER — LOSARTAN POTASSIUM 25 MG PO TABS
25.0000 mg | ORAL_TABLET | Freq: Every day | ORAL | Status: DC
  Administered 2018-02-23 – 2018-02-24 (×2): 25 mg via ORAL
  Filled 2018-02-23 (×2): qty 1

## 2018-02-23 MED ORDER — ADULT MULTIVITAMIN W/MINERALS CH
1.0000 | ORAL_TABLET | Freq: Every day | ORAL | Status: DC
  Administered 2018-02-24: 1 via ORAL
  Filled 2018-02-23: qty 1

## 2018-02-23 MED ORDER — POTASSIUM CHLORIDE CRYS ER 20 MEQ PO TBCR
40.0000 meq | EXTENDED_RELEASE_TABLET | Freq: Two times a day (BID) | ORAL | Status: DC
  Administered 2018-02-23 – 2018-02-24 (×3): 40 meq via ORAL
  Filled 2018-02-23 (×3): qty 2

## 2018-02-23 MED ORDER — MAGNESIUM SULFATE IN D5W 1-5 GM/100ML-% IV SOLN
1.0000 g | Freq: Once | INTRAVENOUS | Status: AC
  Administered 2018-02-23: 1 g via INTRAVENOUS
  Filled 2018-02-23: qty 100

## 2018-02-23 MED ORDER — CARVEDILOL 6.25 MG PO TABS
6.2500 mg | ORAL_TABLET | Freq: Two times a day (BID) | ORAL | Status: DC
  Administered 2018-02-23 – 2018-02-24 (×3): 6.25 mg via ORAL
  Filled 2018-02-23 (×2): qty 2
  Filled 2018-02-23 (×4): qty 1
  Filled 2018-02-23 (×2): qty 2

## 2018-02-23 MED ORDER — ENSURE ENLIVE PO LIQD
237.0000 mL | Freq: Two times a day (BID) | ORAL | Status: DC
  Administered 2018-02-24 (×2): 237 mL via ORAL

## 2018-02-23 NOTE — Progress Notes (Signed)
Initial Nutrition Assessment  DOCUMENTATION CODES:   Non-severe (moderate) malnutrition in context of social or environmental circumstances  INTERVENTION:  Provide Ensure Enlive po BID, each supplement provides 350 kcal and 20 grams of protein.  Provide daily MVI.  NUTRITION DIAGNOSIS:   Moderate Malnutrition related to social / environmental circumstances(advanced age, dementia, inadequate oral intake) as evidenced by moderate fat depletion, moderate muscle depletion.  GOAL:   Patient will meet greater than or equal to 90% of their needs  MONITOR:   PO intake, Supplement acceptance, Labs, Weight trends, I & O's  REASON FOR ASSESSMENT:   Malnutrition Screening Tool    ASSESSMENT:   85 year old female with PMHx of DM, HTN, advanced dementia admitted with hypothermia now resolved.   -Patient was seen by SLP on 2/3. Recommendation was for regular-texture diet with thin liquids. However, patient remains on dysphagia 2 diet with thin liquids. -Pending SNF placement.  Met with patient at bedside. She reports her appetite is "okay" but she is unable to describe how she was eating PTA. Per chart she finished 40% of her dinner on 2/2 but no other meal documentation has been filled out. She had a lunch tray at bedside at time of RD assessment with about 25% complete, but unsure if she was finished eating yet. Patient reports she enjoys Ensure and would like to drink some here.   Patient unsure of weight history. Per chart patient was 68 kg on 03/16/2016, 59 kg on 01/19/2017, and is currently 56.8 kg (125.3 lbs). She has lost 12 kg (17.6% body weight) over 2 years, which is not significant for time frame.  Medications reviewed and include: Novolog 0-9 units TID, potassium chloride 40 mEq BID, Seroquel QHS, NS @ 100 mL/hr.  Labs reviewed: CBG 72-232, Potassium 3.1.   NUTRITION - FOCUSED PHYSICAL EXAM:    Most Recent Value  Orbital Region  Moderate depletion  Upper Arm Region   Moderate depletion  Thoracic and Lumbar Region  Mild depletion  Buccal Region  Moderate depletion  Temple Region  Moderate depletion  Clavicle Bone Region  Moderate depletion  Clavicle and Acromion Bone Region  Moderate depletion  Scapular Bone Region  Unable to assess  Dorsal Hand  Moderate depletion  Patellar Region  Moderate depletion  Anterior Thigh Region  Moderate depletion  Posterior Calf Region  Moderate depletion  Edema (RD Assessment)  Mild  Hair  Reviewed  Eyes  Reviewed  Mouth  Reviewed  Skin  Reviewed  Nails  Reviewed     Diet Order:   Diet Order            DIET DYS 2 Room service appropriate? Yes; Fluid consistency: Thin  Diet effective now             EDUCATION NEEDS:   Not appropriate for education at this time  Skin:  Skin Assessment: Reviewed RN Assessment  Last BM:  02/22/2018 - large type 5  Height:   Ht Readings from Last 1 Encounters:  02/21/18 5' 3"  (1.6 m)   Weight:   Wt Readings from Last 1 Encounters:  02/21/18 56.8 kg   Ideal Body Weight:  52.3 kg  BMI:  Body mass index is 22.2 kg/m.  Estimated Nutritional Needs:   Kcal:  1400-1600  Protein:  60-70 grams  Fluid:  1.4-1.6 L/day  Willey Blade, MS, RD, LDN Office: [phone] Pager: [phone] After Hours/Weekend Pager: [phone]

## 2018-02-23 NOTE — Clinical Social Work Note (Signed)
CSW spoke with patient's daughter Rober Minion [phone]. CSW presented bed offers to daughter and she chose [facility]. CSW notified Tresa Endo at [facility] of bed acceptance. CSW will continue to follow for discharge planning.   Ruthe Mannan MSW, [address] [phone]

## 2018-02-23 NOTE — Progress Notes (Signed)
Physical Therapy Treatment Patient Details Name: Stephanie Graham MRN: [id number] DOB: 09-Jan-1922 Today's Date: 02/23/2018    History of Present Illness Patient admitted when found outside on the ground by passerby. She was combative with EMS at admission. Patient has h/o advanced dementia, DM, HTN, blind in right eye.      PT Comments    Patient agreeable to get up to recliner, received in bed. Patient reports increased Left LE pain this visit. Requires min guard, cues for supine to sit. Required mod assist to perform sit to stand this visit and cues for upright posture. Patient then sat back down.  On second attempt patient continues to require mod assist and is unable to take a step due to pain in left LE. Patient then returned to seated position. 3rd attempt patient was transferred to recliner with max assist squat pivot. Patient will benefit from continued PT to address her difficulties with mobility, transfers and gait.      Follow Up Recommendations  SNF     Equipment Recommendations  None recommended by PT    Recommendations for Other Services       Precautions / Restrictions Precautions Precautions: Fall Restrictions Weight Bearing Restrictions: No    Mobility  Bed Mobility Overal bed mobility: Modified Independent             General bed mobility comments: use of bedrails  Transfers Overall transfer level: Needs assistance Equipment used: Rolling walker (2 wheeled) Transfers: Sit to/from [employer] Sit to Stand: Mod assist   Squat pivot transfers: Max assist        Ambulation/Gait             General Gait Details: patient unable to ambulate this day due to LEft LE pain   Stairs             Wheelchair Mobility    Modified Rankin (Stroke Patients Only)       Balance Overall balance assessment: Needs assistance;History of Falls Sitting-balance support: Feet supported;Single extremity supported Sitting  balance-Leahy Scale: Good     Standing balance support: Bilateral upper extremity supported Standing balance-Leahy Scale: Poor Standing balance comment: patient requiring mod assist for standing this day. Cues for upright posture.                            Cognition Arousal/Alertness: Awake/alert Behavior During Therapy: WFL for tasks assessed/performed Overall Cognitive Status: Within Functional Limits for tasks assessed                                        Exercises      General Comments        Pertinent Vitals/Pain Pain Assessment: Faces Faces Pain Scale: Hurts little more Pain Location: L LE Pain Descriptors / Indicators: Aching Pain Intervention(s): Limited activity within patient's tolerance;Monitored during session    Home Living                      Prior Function            PT Goals (current goals can now be found in the care plan section) Acute Rehab PT Goals Patient Stated Goal: patient unable to state goals, no family present PT Goal Formulation: With patient Time For Goal Achievement: 03/08/18 Potential to Achieve Goals: Fair Progress towards PT goals: Not progressing  toward goals - comment(patient unable to walk today due to left LE pain)    Frequency    Min 2X/week      PT Plan Current plan remains appropriate    Co-evaluation              AM-PAC PT "6 Clicks" Mobility   Outcome Measure  Help needed turning from your back to your side while in a flat bed without using bedrails?: A Little Help needed moving from lying on your back to sitting on the side of a flat bed without using bedrails?: A Little Help needed moving to and from a bed to a chair (including a wheelchair)?: A Lot Help needed standing up from a chair using your arms (e.g., wheelchair or bedside chair)?: A Lot Help needed to walk in hospital room?: Total Help needed climbing 3-5 steps with a railing? : Total 6 Click Score: 12     End of Session Equipment Utilized During Treatment: Gait belt Activity Tolerance: Patient limited by pain Patient left: in chair;with call bell/phone within reach;with chair alarm set Nurse Communication: Mobility status;Other (comment) PT Visit Diagnosis: Unsteadiness on feet (R26.81);Other abnormalities of gait and mobility (R26.89);Muscle weakness (generalized) (M62.81);Pain Pain - Right/Left: Left Pain - part of body: Leg     Time: 1035-1055 PT Time Calculation (min) (ACUTE ONLY): 20 min  Charges:  $Therapeutic Activity: 8-22 mins                     Ritamarie Arkin, PT, GCS 02/23/18,11:13 AM

## 2018-02-23 NOTE — Progress Notes (Signed)
[employer] - [place] [facility]   PATIENT NAME: Stephanie Graham    MR#:  [id number]  DATE OF BIRTH:  04-27-1921  SUBJECTIVE:   Patient states that she feels well this morning.  She is hungry and ready to eat breakfast.  She denies any chest pain or shortness of breath.  No fevers or chills.  REVIEW OF SYSTEMS:  Review of Systems  Constitutional: Negative for chills and fever.  HENT: Negative for congestion and sore throat.   Eyes: Negative for blurred vision and double vision.  Respiratory: Negative for cough and shortness of breath.   Cardiovascular: Negative for chest pain and palpitations.  Gastrointestinal: Negative for nausea and vomiting.  Genitourinary: Negative for dysuria and urgency.  Musculoskeletal: Negative for back pain and neck pain.  Neurological: Negative for dizziness and headaches.  Psychiatric/Behavioral: Negative for depression. The patient is not nervous/anxious.     DRUG ALLERGIES:  No Known Allergies VITALS:  Blood pressure (!) 181/72, pulse 60, temperature 98.2 F (36.8 C), temperature source Oral, resp. rate 17, height 5\' 3"  (1.6 m), weight 56.8 kg, SpO2 97 %. PHYSICAL EXAMINATION:  Physical Exam  Constitutional:   Laying in bed in no acute distress HEENT: Normocephalic, atraumatic, EOMI, no scleral icterus, moist mucous membranes Neck: Supple, full range of motion, no JVD Cardiovascular: RRR, no murmurs, rubs, gallops Pulmonary: Lungs clear to auscultation bilaterally, no crackles or wheezes, normal work of breathing Abdominal: +BS, soft, nontender, nondistended Skin: Skin is warm and dry, no rashes or lesions Neurological: Cranial nerves II through XII grossly intact, moving all extremities, + global weakness, sensation intact throughout, no focal deficits Psychiatric: Awake and oriented x2, appropriate affect LABORATORY PANEL:  Female CBC Recent Labs  Lab 02/23/18 0545  WBC 5.3  HGB 10.6*  HCT 33.3*  PLT 170    ------------------------------------------------------------------------------------------------------------------ Chemistries  Recent Labs  Lab 02/22/18 0406 02/23/18 0545  NA 142 143  K 3.7 3.1*  CL 109 111  CO2 27 26  GLUCOSE 96 105*  BUN 14 8  CREATININE 0.88 0.66  CALCIUM 8.1* 8.3*  MG  --  1.7  AST 28  --   ALT 19  --   ALKPHOS 42  --   BILITOT 0.8  --    RADIOLOGY:  No results found. ASSESSMENT AND PLAN:   1.  Hypothermia- resolved. Likely due to being outside for unknown period of time.  -Blood cultures with no growth -PT recommending SNF, awaiting placement  2.  Elevated troponin- Likely due to demand ischemia.  No active chest pain. -Monitor  3.  Advanced dementia- stable -Continue home Aricept  4.  Hypertension: BP elevated today. -Restart home coreg and losartan  5.  Type 2 diabetes -Continue SSI  6.  Hypokalemia/hypomagnesemia- K 3.1, mag 1.7. -Replete and recheck  Patient is medically stable for discharge.  Awaiting placement at SNF.  All the records are reviewed and case discussed with Care Management/Social Worker. Management plans discussed with the patient, family and they are in agreement.  CODE STATUS: Full Code  TOTAL TIME TAKING CARE OF THIS PATIENT: 35 minutes.   More than 50% of the time was spent in counseling/coordination of care: YES  POSSIBLE D/C tomorrow, DEPENDING ON CLINICAL CONDITION.   Jinny Blossom Aydn Ferrara M.D on 02/23/2018 at 2:24 PM  Between 7am to 6pm - Pager [phone]  After 6pm go to [email or web address] - Therapist, nutritional Hospitalists  Office  [phone]  CC: Primary care  physician; Marisue IvanLinthavong, [place], MD  Note: This dictation was prepared with Dragon dictation along with smaller phrase technology. Any transcriptional errors that result from this process are unintentional.

## 2018-02-24 DIAGNOSIS — E440 Moderate protein-calorie malnutrition: Secondary | ICD-10-CM

## 2018-02-24 LAB — BASIC METABOLIC PANEL
Anion gap: 7 (ref 5–15)
BUN: 8 mg/dL (ref 8–23)
CHLORIDE: 110 mmol/L (ref 98–111)
CO2: 24 mmol/L (ref 22–32)
Calcium: 8.3 mg/dL — ABNORMAL LOW (ref 8.9–10.3)
Creatinine, Ser: 0.71 mg/dL (ref 0.44–1.00)
GFR calc Af Amer: >60 mL/min (ref >60)
GFR calc non Af Amer: >60 mL/min (ref >60)
Glucose, Bld: 146 mg/dL — ABNORMAL HIGH (ref 70–99)
Potassium: 3.6 mmol/L (ref 3.5–5.1)
SODIUM: 141 mmol/L (ref 135–145)

## 2018-02-24 LAB — GLUCOSE, CAPILLARY
Glucose-Capillary: 121 mg/dL — ABNORMAL HIGH (ref 70–99)
Glucose-Capillary: 244 mg/dL — ABNORMAL HIGH (ref 70–99)

## 2018-02-24 LAB — MAGNESIUM: MAGNESIUM: 2 mg/dL (ref 1.7–2.4)

## 2018-02-24 MED ORDER — QUETIAPINE FUMARATE 50 MG PO TABS: 50.0000 mg | ORAL_TABLET | Freq: Every day | ORAL | 0 refills | Status: DC

## 2018-02-24 MED ORDER — METFORMIN HCL 500 MG PO TABS: 500.0000 mg | ORAL_TABLET | Freq: Two times a day (BID) | ORAL | 0 refills | Status: AC

## 2018-02-24 NOTE — Progress Notes (Signed)
Physical Therapy Treatment Patient Details Name: Stephanie Graham MRN: [id number] DOB: August 29, 1921 Today's Date: 02/24/2018    History of Present Illness Patient admitted when found outside on the ground by passerby. She was combative with EMS at admission. Patient has h/o advanced dementia, DM, HTN, blind in right eye.      PT Comments    Pt agreeable to PT for supine bed exercises. Notes continued pain in distal LLE, primarily foot/ankle that is mild to moderate. Pt participates with limited exercises with assist needed; declines up/out of bed at this time. Continue PT to progress strength, endurance/activity tolerance to improve functional mobility with decreased assist.   Follow Up Recommendations  SNF     Equipment Recommendations  None recommended by PT    Recommendations for Other Services       Precautions / Restrictions Precautions Precautions: Fall Restrictions Weight Bearing Restrictions: No    Mobility  Bed Mobility               General bed mobility comments: Not tested; declined  Transfers                    Ambulation/Gait                 Stairs             Wheelchair Mobility    Modified Rankin (Stroke Patients Only)       Balance                                            Cognition Arousal/Alertness: Awake/alert Behavior During Therapy: WFL for tasks assessed/performed Overall Cognitive Status: Within Functional Limits for tasks assessed                                        Exercises General Exercises - Lower Extremity Ankle Circles/Pumps: AROM;Both;15 reps;Supine;AAROM Quad Sets: Strengthening;Both;10 reps Gluteal Sets: Strengthening;Both;10 reps Short Arc QuadBarbaraann Boys;Both;10 reps;Supine Heel Slides: AAROM;Both;10 reps;Supine Hip ABduction/ADduction: AAROM;Both;10 reps;Supine Straight Leg Raises: AAROM;Both;10 reps    General Comments        Pertinent Vitals/Pain  Pain Assessment: (moderate) Pain Location: distal LLE/ankle/foot    Home Living                      Prior Function            PT Goals (current goals can now be found in the care plan section) Progress towards PT goals: Progressing toward goals(slowly)    Frequency    Min 2X/week      PT Plan Current plan remains appropriate    Co-evaluation              AM-PAC PT "6 Clicks" Mobility   Outcome Measure  Help needed turning from your back to your side while in a flat bed without using bedrails?: A Little Help needed moving from lying on your back to sitting on the side of a flat bed without using bedrails?: A Lot Help needed moving to and from a bed to a chair (including a wheelchair)?: Total Help needed standing up from a chair using your arms (e.g., wheelchair or bedside chair)?: A Lot Help needed to walk in hospital room?: Total Help needed climbing 3-5 steps with  a railing? : Total 6 Click Score: 10    End of Session   Activity Tolerance: Patient limited by fatigue;Patient limited by pain Patient left: in bed;with call bell/phone within reach;with bed alarm set   PT Visit Diagnosis: Unsteadiness on feet (R26.81);Other abnormalities of gait and mobility (R26.89);Muscle weakness (generalized) (M62.81);Pain Pain - Right/Left: Left Pain - part of body: Leg     Time: 3536-1443 PT Time Calculation (min) (ACUTE ONLY): 18 min  Charges:  $Therapeutic Exercise: 8-22 mins                     Scot Dock, PTA 02/24/2018 12:42

## 2018-02-24 NOTE — Discharge Instructions (Signed)
It was so nice to meet you during this hospitalization!  You came into the hospital with cold body temperature, most likely because you were outside in the cold. Your body temperatures have been fine since then.  I have decreased your metformin dose to 500mg  twice a day because of your kidneys.  Take care, Dr. Nancy Marus

## 2018-02-24 NOTE — Discharge Summary (Signed)
[facility] [place] at [facility]   PATIENT NAME: Stephanie RotundaCatherine Graham    MR#:  [id number]  DATE OF BIRTH:  Dec 13, 1921  DATE OF ADMISSION:  02/21/2018   ADMITTING PHYSICIAN: Adrian SaranSital Mody, MD  DATE OF DISCHARGE: 02/24/18  PRIMARY CARE PHYSICIAN: Marisue IvanLinthavong, [place], MD   ADMISSION DIAGNOSIS:  Lactic acidosis [E87.2] Hypothermia, initial encounter [T68.XXXA] DISCHARGE DIAGNOSIS:  Active Problems:   Hypothermia   Malnutrition of moderate degree  SECONDARY DIAGNOSIS:   Past Medical History:  Diagnosis Date  . 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 Diabetes mellitus without complication (HCC)   . Hypertension    HOSPITAL COURSE:   Stephanie EvansCatherine is a 85 year old female who presented to the ED with hypothermia. This was felt to be due to being outside for ~4 hours in the cold in the middle of the night. Her body temperature improved with the bair hugger. Blood cultures were negative and she did not have any signs of infection. She was evaluated by PT, who recommended SNF.  1. Hypothermia- resolved. Likely due to being outside for unknown period of time.  -Blood cultures with no growth  2. Elevated troponin- Likely due to demand ischemia.  No active chest pain.  3. Advanced dementia- stable -Continued home Aricept  4. Hypertension: BP elevated today. -Continued home coreg and losartan  5.  Type 2 diabetes -Metformin dose decreased to 500mg  bid due to kidney function  DISCHARGE CONDITIONS:  Advanced dementia Hypertension Type 2 diabetes CONSULTS OBTAINED:  Treatment Team:  Alwyn Peaallwood, Dwayne D, MD DRUG ALLERGIES:  No Known Allergies DISCHARGE MEDICATIONS:   Allergies as of 02/24/2018   No Known Allergies     Medication List    TAKE these medications   acetaminophen 650 MG CR tablet Commonly known as:  TYLENOL Take 1,300 mg by mouth every 8 (eight) hours as needed for pain.   aspirin 325 MG tablet Take 1 tablet  (325 mg total) by mouth daily.   carvedilol 12.5 MG tablet Commonly known as:  COREG Take 6.25 mg by mouth 2 (two) times daily.   donepezil 10 MG tablet Commonly known as:  ARICEPT Take 10 mg by mouth at bedtime.   losartan 25 MG tablet Commonly known as:  COZAAR Take 25 mg by mouth daily.   meloxicam 7.5 MG tablet Commonly known as:  MOBIC Take 7.5-15 mg by mouth daily as needed for pain.   metFORMIN 500 MG tablet Commonly known as:  GLUCOPHAGE Take 1 tablet (500 mg total) by mouth 2 (two) times daily. What changed:  how much to take   pravastatin 40 MG tablet Commonly known as:  PRAVACHOL Take 80 mg by mouth at bedtime.   QUEtiapine 50 MG tablet Commonly known as:  SEROQUEL Take 1 tablet (50 mg total) by mouth at bedtime.   traZODone 50 MG tablet Commonly known as:  DESYREL Take 50 mg by mouth at bedtime as needed for sleep.        DISCHARGE INSTRUCTIONS:  1. F/u with PCP in 5 days 2. Metformin dose decreased to 500mg  twice a day DIET:  Regular;Thin liquid   Liquid Administration via: Cup Medication Administration: Whole meds with liquid Supervision: Full supervision/cueing for compensatory strategies Compensations: Minimize environmental distractions;Slow rate;Small sips/bites Postural Changes: Seated upright at 90 degrees  DISCHARGE CONDITION:  Stable ACTIVITY:  Activity as tolerated OXYGEN:  Home Oxygen: No.  Oxygen Delivery: room air DISCHARGE LOCATION:  nursing home  If you experience worsening of your admission symptoms, develop shortness of breath, life threatening emergency, suicidal or homicidal thoughts you must seek medical attention immediately by calling 911 or calling your MD immediately  if symptoms less severe.  You Must read complete instructions/literature along with all the possible adverse reactions/side effects for all the Medicines you take and that have been prescribed to you. Take any new Medicines after you have completely  understood and accpet all the possible adverse reactions/side effects.   Please note  You were cared for by a hospitalist during your hospital stay. If you have any questions about your discharge medications or the care you received while you were in the hospital after you are discharged, you can call the unit and asked to speak with the hospitalist on call if the hospitalist that took care of you is not available. Once you are discharged, your primary care physician will handle any further medical issues. Please note that NO REFILLS for any discharge medications will be authorized once you are discharged, as it is imperative that you return to your primary care physician (or establish a relationship with a primary care physician if you do not have one) for your aftercare needs so that they can reassess your need for medications and monitor your lab values.    On the day of Discharge:  VITAL SIGNS:  Blood pressure (!) 157/73, pulse 69, temperature 98 F (36.7 C), temperature source Oral, resp. rate 18, height 5\' 3"  (1.6 m), weight 56.8 kg, SpO2 96 %. PHYSICAL EXAMINATION:  Constitutional:  Laying in bed in no acute distress, thin-appearing HEENT: Normocephalic, atraumatic, EOMI, no scleral icterus, moist mucous membranes Neck: Supple, full range of motion, no JVD Cardiovascular: RRR, no murmurs, rubs, gallops Pulmonary: Lungs clear to auscultation bilaterally, no crackles or wheezes, normal work of breathing Abdominal: +BS, soft, nontender, nondistended Skin: Skin is warm and dry, no rashes or lesions Neurological: Cranial nerves II through XII grossly intact, moving all extremities, + global weakness, sensation intact throughout, no focal deficits Psychiatric: Awake and oriented x2, appropriate affect DATA REVIEW:   CBC Recent Labs  Lab 02/23/18 0545  WBC 5.3  HGB 10.6*  HCT 33.3*  PLT 170    Chemistries  Recent Labs  Lab 02/22/18 0406  02/24/18 0417  NA 142   < > 141  K 3.7    < > 3.6  CL 109   < > 110  CO2 27   < > 24  GLUCOSE 96   < > 146*  BUN 14   < > 8  CREATININE 0.88   < > 0.71  CALCIUM 8.1*   < > 8.3*  MG  --    < > 2.0  AST 28  --   --   ALT 19  --   --   ALKPHOS 42  --   --   BILITOT 0.8  --   --    < > = values in this interval not displayed.     Microbiology Results  Results for orders placed or performed during the hospital encounter of 02/21/18  Urine culture     Status: None   Collection Time: 02/21/18  9:11 AM  Result Value Ref Range Status   Specimen Description URINE, RANDOM  Final   Special Requests   Final    NONE Performed at [facility], [address] [place], [place] [address]    Culture NO GROWTH  Final   Report Status  02/22/2018 FINAL  Final  Blood Culture (routine x 2)     Status: None (Preliminary result)   Collection Time: 02/21/18  9:43 AM  Result Value Ref Range Status   Specimen Description BLOOD RIGHT HAND  Final   Special Requests   Final    BOTTLES DRAWN AEROBIC AND ANAEROBIC Blood Culture results may not be optimal due to an inadequate volume of blood received in culture bottles   Culture   Final    NO GROWTH 3 DAYS Performed at [facility], [address], [place], [place] [address]    Report Status PENDING  Incomplete  Blood Culture (routine x 2)     Status: None (Preliminary result)   Collection Time: 02/21/18  9:43 AM  Result Value Ref Range Status   Specimen Description BLOOD RIGHT WRIST  Final   Special Requests   Final    BOTTLES DRAWN AEROBIC AND ANAEROBIC Blood Culture results may not be optimal due to an inadequate volume of blood received in culture bottles   Culture   Final    NO GROWTH 3 DAYS Performed at [facility], [address], [place], [place] [address]    Report Status PENDING  Incomplete  MRSA PCR Screening     Status: Abnormal   Collection Time: 02/21/18 11:42 AM  Result Value Ref Range Status   MRSA by PCR POSITIVE (A) NEGATIVE Final     Comment:        The GeneXpert MRSA Assay (FDA approved for NASAL specimens only), is one component of a comprehensive MRSA colonization surveillance program. It is not intended to diagnose MRSA infection nor to guide or monitor treatment for MRSA infections. CRITICAL RESULT CALLED TO, READ BACK BY AND VERIFIED WITH: CALLED TO MEERA PATEL @1415  02/21/2018 Performed at [facility], [address] [place], [place] [address]     RADIOLOGY:  No results found.   Management plans discussed with the patient, family and they are in agreement.  CODE STATUS: Full Code   TOTAL TIME TAKING CARE OF THIS PATIENT: 40 minutes.    Jinny Blossom  M.D on 02/24/2018 at 10:10 AM  Between 7am to 6pm - Pager - [phone]  After 6pm go to [email or web address] - Social research officer, government  [facility] [place] [facility]  Office  [phone]  CC: Primary care physician; Marisue Ivan, MD   Note: This dictation was prepared with Dragon dictation along with smaller phrase technology. Any transcriptional errors that result from this process are unintentional.

## 2018-02-24 NOTE — Clinical Social Work Note (Signed)
Patient is medically ready for discharge today. CSW notified patient's daughter Stephanie Graham [phone] of discharge today to [facility]. CSW also notified Tresa Endo at [employer] of discharge today. Patient will be transported by EMS. RN to call report and call for transport.   Ruthe Mannan MSW, [address] [phone]

## 2018-02-25 NOTE — Care Management Important Message (Signed)
Important Message  Patient Details  Name: Kenyah Oller MRN: [id number] Date of Birth: [birth year]   Medicare Important Message Given:  Yes Late entry of note. Terrilee Croak, RN CM obtained verbal consent for acknowledgement of Important Message from Medicare from Daughter, Rober Minion on 02/24/18 @ 12:30 pm. Form to be scanned into medical record.  [name] A Reed Dady 02/25/2018, 8:31 AM

## 2018-02-26 LAB — CULTURE, BLOOD (ROUTINE X 2)
Culture: NO GROWTH
Culture: NO GROWTH

## 2018-10-17 ENCOUNTER — Emergency Department
Admission: EM | Admit: 2018-10-17 | Discharge: 2018-10-18 | Disposition: A | Discharge status: Home / Self Care | Payer: Medicare Other | Attending: Emergency Medicine | Admitting: Emergency Medicine

## 2018-10-17 ENCOUNTER — Emergency Department: Payer: Medicare Other

## 2018-10-17 ENCOUNTER — Other Ambulatory Visit: Payer: Self-pay

## 2018-10-17 DIAGNOSIS — H5461 Unqualified visual loss, right eye, normal vision left eye: Secondary | ICD-10-CM | POA: Diagnosis not present

## 2018-10-17 DIAGNOSIS — Z7982 Long term (current) use of aspirin: Secondary | ICD-10-CM | POA: Diagnosis not present

## 2018-10-17 DIAGNOSIS — R443 Hallucinations, unspecified: Secondary | ICD-10-CM | POA: Diagnosis present

## 2018-10-17 DIAGNOSIS — Z79899 Other long term (current) drug therapy: Secondary | ICD-10-CM | POA: Insufficient documentation

## 2018-10-17 DIAGNOSIS — I10 Essential (primary) hypertension: Secondary | ICD-10-CM | POA: Diagnosis not present

## 2018-10-17 DIAGNOSIS — E119 Type 2 diabetes mellitus without complications: Secondary | ICD-10-CM | POA: Diagnosis not present

## 2018-10-17 DIAGNOSIS — G4700 Insomnia, unspecified: Secondary | ICD-10-CM | POA: Diagnosis not present

## 2018-10-17 DIAGNOSIS — F0391 Unspecified dementia with behavioral disturbance: Secondary | ICD-10-CM | POA: Diagnosis not present

## 2018-10-17 DIAGNOSIS — Z7984 Long term (current) use of oral hypoglycemic drugs: Secondary | ICD-10-CM | POA: Diagnosis not present

## 2018-10-17 LAB — URINALYSIS, COMPLETE (UACMP) WITH MICROSCOPIC
Bacteria, UA: NONE SEEN
Bilirubin Urine: NEGATIVE
Glucose, UA: NEGATIVE mg/dL
Hgb urine dipstick: NEGATIVE
Ketones, ur: NEGATIVE mg/dL
Leukocytes,Ua: NEGATIVE
Nitrite: NEGATIVE
Protein, ur: NEGATIVE mg/dL
Specific Gravity, Urine: 1.018 (ref 1.005–1.030)
pH: 5 (ref 5.0–8.0)

## 2018-10-17 LAB — GLUCOSE, CAPILLARY: Glucose-Capillary: 89 mg/dL (ref 70–99)

## 2018-10-17 LAB — CBC
HCT: 39.8 % (ref 36.0–46.0)
Hemoglobin: 12.6 g/dL (ref 12.0–15.0)
MCH: 31 pg (ref 26.0–34.0)
MCHC: 31.7 g/dL (ref 30.0–36.0)
MCV: 97.8 fL (ref 80.0–100.0)
Platelets: 191 10*3/uL (ref 150–400)
RBC: 4.07 MIL/uL (ref 3.87–5.11)
RDW: 13.1 % (ref 11.5–15.5)
WBC: 4.7 10*3/uL (ref 4.0–10.5)
nRBC: 0 % (ref 0.0–0.2)

## 2018-10-17 LAB — COMPREHENSIVE METABOLIC PANEL
ALT: 15 U/L (ref 0–44)
AST: 18 U/L (ref 15–41)
Albumin: 3.8 g/dL (ref 3.5–5.0)
Alkaline Phosphatase: 50 U/L (ref 38–126)
Anion gap: 11 (ref 5–15)
BUN: 16 mg/dL (ref 8–23)
CO2: 27 mmol/L (ref 22–32)
Calcium: 9.5 mg/dL (ref 8.9–10.3)
Chloride: 105 mmol/L (ref 98–111)
Creatinine, Ser: 1 mg/dL (ref 0.44–1.00)
GFR calc Af Amer: 55 mL/min — ABNORMAL LOW (ref >60)
GFR calc non Af Amer: 48 mL/min — ABNORMAL LOW (ref >60)
Glucose, Bld: 108 mg/dL — ABNORMAL HIGH (ref 70–99)
Potassium: 4 mmol/L (ref 3.5–5.1)
Sodium: 143 mmol/L (ref 135–145)
Total Bilirubin: 0.6 mg/dL (ref 0.3–1.2)
Total Protein: 7.2 g/dL (ref 6.5–8.1)

## 2018-10-17 LAB — URINE DRUG SCREEN, QUALITATIVE (ARMC ONLY)
Amphetamines, Ur Screen: NOT DETECTED
Barbiturates, Ur Screen: NOT DETECTED
Benzodiazepine, Ur Scrn: NOT DETECTED
Cannabinoid 50 Ng, Ur ~~LOC~~: NOT DETECTED
Cocaine Metabolite,Ur ~~LOC~~: NOT DETECTED
MDMA (Ecstasy)Ur Screen: NOT DETECTED
Methadone Scn, Ur: NOT DETECTED
Opiate, Ur Screen: NOT DETECTED
Phencyclidine (PCP) Ur S: NOT DETECTED
Tricyclic, Ur Screen: NOT DETECTED

## 2018-10-17 LAB — BASIC METABOLIC PANEL
Anion gap: 11 (ref 5–15)
BUN: 18 mg/dL (ref 8–23)
CO2: 29 mmol/L (ref 22–32)
Calcium: 9.4 mg/dL (ref 8.9–10.3)
Chloride: 104 mmol/L (ref 98–111)
Creatinine, Ser: 1.08 mg/dL — ABNORMAL HIGH (ref 0.44–1.00)
GFR calc Af Amer: 51 mL/min — ABNORMAL LOW (ref >60)
GFR calc non Af Amer: 44 mL/min — ABNORMAL LOW (ref >60)
Glucose, Bld: 109 mg/dL — ABNORMAL HIGH (ref 70–99)
Potassium: 4.2 mmol/L (ref 3.5–5.1)
Sodium: 144 mmol/L (ref 135–145)

## 2018-10-17 LAB — VALPROIC ACID LEVEL: Valproic Acid Lvl: 22 ug/mL — ABNORMAL LOW (ref 50.0–100.0)

## 2018-10-17 MED ORDER — SODIUM CHLORIDE 0.9 % IV BOLUS
1000.0000 mL | Freq: Once | INTRAVENOUS | Status: AC
  Administered 2018-10-17: 21:00:00 1000 mL via INTRAVENOUS

## 2018-10-17 NOTE — ED Provider Notes (Signed)
[facility] Emergency Department Provider Note   ____________________________________________   First MD Initiated Contact with Patient 10/17/18 1906     (approximate)  I have reviewed the triage vital signs and the nursing notes.   HISTORY  Chief Complaint Hallucinations and Insomnia   HPI Stephanie Graham is a 85 y.o. female with some dementia who has not been sleeping for the last 2 days and has been having hallucinations as well.  She sees Dr. Malvin Johns neurology and has been prescribed a number of medicines but these do not seem to be working.  His medicines include mirtazapine which she started recently and sertraline which is is supposed to be stopped.        Past Medical History:  Diagnosis Date  . 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 Diabetes mellitus without complication (HCC)   . Hypertension     Patient Active Problem List   Diagnosis Date Noted  . Malnutrition of moderate degree 02/24/2018  . Hypothermia 02/21/2018  . 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 Syncope and collapse 01/18/2017  . HTN (hypertension) 01/18/2017  . Diabetes (HCC) 01/18/2017  . Nausea vomiting and diarrhea 01/18/2017    Past Surgical History:  Procedure Laterality Date  . REPLACEMENT TOTAL KNEE Left     Prior to Admission medications   Medication Sig Start Date End Date Taking? Authorizing Provider  acetaminophen (TYLENOL) 650 MG CR tablet Take 1,300 mg by mouth every 8 (eight) hours as needed for pain.    [provider]  aspirin 325 MG tablet Take 1 tablet (325 mg total) by mouth daily. 01/20/17   Altamese Dilling, MD  carvedilol (COREG) 12.5 MG tablet Take 6.25 mg by mouth 2 (two) times daily.     [provider]  donepezil (ARICEPT) 10 MG tablet Take 10 mg by mouth at bedtime. 02/06/16   [provider]  losartan (COZAAR) 25 MG tablet Take 25 mg by mouth daily.    [provider]   meloxicam (MOBIC) 7.5 MG tablet Take 7.5-15 mg by mouth daily as needed for pain.    [provider]  metFORMIN (GLUCOPHAGE) 500 MG tablet Take 1 tablet (500 mg total) by mouth 2 (two) times daily. 02/24/18   Mayo, Allyn Kenner, MD  pravastatin (PRAVACHOL) 40 MG tablet Take 80 mg by mouth at bedtime. 01/04/16   [provider]  QUEtiapine (SEROQUEL) 50 MG tablet Take 1 tablet (50 mg total) by mouth at bedtime. 02/24/18   Mayo, Allyn Kenner, MD  traZODone (DESYREL) 50 MG tablet Take 50 mg by mouth at bedtime as needed for sleep.    [provider]    Allergies Patient has no known allergies.  History reviewed. No pertinent family history.  Social History Social History   Tobacco Use  . Smoking status: Never Smoker  . Smokeless tobacco: Former Engineer, water Use Topics  . Alcohol use: No  . Drug use: No    Review of Systems  Constitutional: No fever/chills Eyes: No visual changes. ENT: No sore throat. Cardiovascular: Denies chest pain. Respiratory: Denies shortness of breath. Gastrointestinal: No abdominal pain.  No nausea, no vomiting.  No diarrhea.  No constipation. Genitourinary: Negative for dysuria. Musculoskeletal: Negative for back pain. Skin: Negative for rash. Neurological: Negative for headaches, focal weakness ____________________________________________   PHYSICAL EXAM:  VITAL SIGNS: ED Triage Vitals [10/17/18 1728]  Enc Vitals Group     BP (!) 159/92  Pulse Rate 83     Resp 18     Temp 98.5 F (36.9 C)     Temp Source Oral     SpO2 98 %     Weight      Height      Head Circumference      Peak Flow      Pain Score      Pain Loc      Pain Edu?      Excl. in [place]?     Constitutional: Alert and oriented to person and hospital. Well appearing and in no acute distress. Eyes: Conjunctivae are normal. Head: Atraumatic. Nose: No congestion/rhinnorhea. Mouth/Throat: Mucous membranes are moist.  Oropharynx non-erythematous. Neck:  No stridor.   Cardiovascular: Normal rate, regular rhythm. Grossly normal heart sounds.  Good peripheral circulation. Respiratory: Normal respiratory effort.  No retractions. Lungs CTAB. Gastrointestinal: Soft and nontender. No distention. No abdominal bruits. No CVA tenderness. Musculoskeletal: No lower extremity tenderness nor edema.   Neurologic:  Normal speech and language. No gross focal neurologic deficits are appreciated.  Skin:  Skin is warm, dry and intact. No rash noted.   ____________________________________________   LABS (all labs ordered are listed, but only abnormal results are displayed)  Labs Reviewed  COMPREHENSIVE METABOLIC PANEL - Abnormal; Notable for the following components:      Result Value   Glucose, Bld 108 (*)    GFR calc non Af Amer 48 (*)    GFR calc Af Amer 55 (*)    All other components within normal limits  GLUCOSE, CAPILLARY  CBC  URINE DRUG SCREEN, QUALITATIVE ([facility] ONLY)  VALPROIC ACID LEVEL   ____________________________________________  EKG   ____________________________________________  RADIOLOGY  ED MD interpretation:    Official radiology report(s): No results found.  ____________________________________________   PROCEDURES  Procedure(s) performed (including Critical Care):  Procedures   ____________________________________________   INITIAL IMPRESSION / ASSESSMENT AND PLAN / ED COURSE  Stephanie Graham was evaluated in Emergency Department on 10/17/2018 for the symptoms described in the history of present illness. She was evaluated in the context of the global COVID-19 pandemic, which necessitated consideration that the patient might be at risk for infection with the SARS-CoV-2 virus that causes COVID-19. Institutional protocols and algorithms that pertain to the evaluation of patients at risk for COVID-19 are in a state of rapid change based on information released by regulatory bodies including the CDC and federal  and state organizations. These policies and algorithms were followed during the patient's care in the ED.     Patient's renal function has worsened since the beginning of the year.  I will try some fluids and see if that makes it better.  Additionally we will have to do a tele-psychiatry consult.  I want to sign this patient out.         ____________________________________________   FINAL CLINICAL IMPRESSION(S) / ED DIAGNOSES  Final diagnoses:  None     ED Discharge Orders    None       Note:  This document was prepared using Dragon voice recognition software and may include unintentional dictation errors.    Nena Polio, MD 10/17/18 2027

## 2018-10-17 NOTE — ED Provider Notes (Signed)
-----------------------------------------   11:53 PM on 10/17/2018 -----------------------------------------   Blood pressure (!) 141/70, pulse 71, temperature 98.5 F (36.9 C), temperature source Oral, resp. rate 18, SpO2 100 %.  The patient is calm and cooperative at this time.  Psychiatry consult was performed and the psychiatrist believes that the symptoms are consistent with worsening dementia, possibly Lewy body dementia given hallucinations.  He also believes that the medication she was on were not appropriate and not adequate.  He recommended stopping the Depakote and the mirtazapine and starting Seroquel 50 mg nightly.  The daughter was comfortable with this plan.  I will give a dose of Seroquel tonight, write a prescription, and provide some outpatient resources for [facility] follow-up.  If the daughter is comfortable taking the patient home I strongly believe this would be in her best interest given her age and the probability that being in the emergency department will lead to delirium and worsening of her already concerning symptoms.    ----------------------------------------- 12:23 AM on 10/18/2018 -----------------------------------------  Daughter was very comfortable with the plan to take her home and that was her desire in the first place.  She is also comfortable with the medication changes.  I told her verbally as well as put them in the written discharge instructions and provided some recommendations for follow-up.  Patient is calm and cooperative at this time and is getting a dose of Seroquel 50 mg by mouth prior to discharge.  I gave my usual and customary return precautions.   Hinda Kehr, MD 10/18/18 [phone]

## 2018-10-17 NOTE — ED Triage Notes (Signed)
Pt presents via POV c/o insomnia and worsening hallucinations. Daughter states pt has not slept in 2 days. Hx dementia.

## 2018-10-17 NOTE — ED Notes (Signed)
Spoke with Dr. Archie Balboa who stated that we did not need acetaminophen level on pt. Order cancelled.

## 2018-10-17 NOTE — ED Provider Notes (Signed)
Assumed care from Dr. Cinda Quest at 9 PM. Briefly, the patient is a 85 y.o. female with PMHx of  has a past medical history of Blindness of right eye with normal vision in contralateral eye, Diabetes mellitus without complication ([place]), and Hypertension. here with hallucinations, difficulty sleeping. Sees Dr. Melrose Nakayama w/ Neurology. Pt has reportedly not been sleeping, and has begun hallucinating. Concern for delirium. Plan to f/u UA, repeat BMP. If negative UA and BMP reassuring, consult telepsych for med management.  Labs Reviewed  COMPREHENSIVE METABOLIC PANEL - Abnormal; Notable for the following components:      Result Value   Glucose, Bld 108 (*)    GFR calc non Af Amer 48 (*)    GFR calc Af Amer 55 (*)    All other components within normal limits  GLUCOSE, CAPILLARY  CBC  URINE DRUG SCREEN, QUALITATIVE ([facility] ONLY)  VALPROIC ACID LEVEL  BASIC METABOLIC PANEL  URINALYSIS, COMPLETE ([facility]) WITH MICROSCOPIC    Course of Care: -UA unremarkable. Awaiting psych eval. Suspect this is delirium in setting of med changes w/ underlying dementia.     Duffy Bruce, MD 10/18/18 [phone]

## 2018-10-17 NOTE — ED Notes (Signed)
First Nurse Note: Pt daughter brought pt in stating that she has not slept in 2 days and that pt is having hallucinations.

## 2018-10-17 NOTE — BH Assessment (Signed)
Assessment Note  Stephanie Graham is an 85 y.o. female. TTS spoke with Stephanie Graham [phone] - (Graham of Stephanie Graham) She reports that her mother did not sleep last night.  She stated that her mother had her prescriptions changed on Friday by her neurologist.  She states that she has been up all day and all night.  The new medication is Mirtazatine 7.5 mg.  She reports that her mother has a prior diagnosis of Dementia.  She shared that her mother has been having hallucinations of seeing "black dots".  Stephanie Graham has a prior diagnosis of glaucoma, but is reportedly not going to have surgery, due to her age.  The lack of sleep has been a concern to Stephanie Graham, and is her reason for coming into the ED today. Symptoms of depression were denied. Symptoms of anxiety were denied.  Suicidal and homicidal ideation or intent were denied.   Diagnosis:   Past Medical History:  Past Medical History:  Diagnosis Date  . 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 Diabetes mellitus without complication ([place])   . Hypertension     Past Surgical History:  Procedure Laterality Date  . REPLACEMENT TOTAL KNEE Left     Family History: History reviewed. No pertinent family history.  Social History:  reports that she has never smoked. She has quit using smokeless tobacco. She reports that she does not drink alcohol or use drugs.  Additional Social History:  Alcohol / Drug Use History of alcohol / drug use?: No history of alcohol / drug abuse  CIWA: CIWA-Ar BP: (!) 141/70 Pulse Rate: 71 COWS:    Allergies: No Known Allergies  Home Medications: (Not in a hospital admission)   OB/GYN Status:  No LMP recorded. Patient is postmenopausal.  General Assessment Data Location of Assessment: [facility] ED TTS Assessment: In system Is this a Tele or Face-to-Face Assessment?: Face-to-Face Is this an Initial Assessment or a Re-assessment for this encounter?: Initial  Assessment Patient Accompanied by:: (Adult Graham) Language Other than English: No Living Arrangements: Other (Comment)(Private residence) What gender do you identify as?: Female Marital status: Widowed Pregnancy Status: No Living Arrangements: Other relatives(Stephanie Graham) Can pt return to current living arrangement?: Yes Admission Status: Voluntary Is patient capable of signing voluntary admission?: Yes Referral Source: Self/Family/Friend Insurance type: Medicare  Medical Screening Exam ([place]) Medical Exam completed: Yes  Crisis Care Plan Living Arrangements: Other relatives(Stephanie Graham) Legal Guardian: Stephanie Graham -[phone] ) Name of Psychiatrist: None Name of Therapist: None  Education Status Is patient currently in school?: No Is the patient employed, unemployed or receiving disability?: Unemployed  Risk to self with the past 6 months Suicidal Ideation: No Has patient been a risk to self within the past 6 months prior to admission? : No Suicidal Intent: No Has patient had any suicidal intent within the past 6 months prior to admission? : No Is patient at risk for suicide?: No Suicidal Plan?: No Has patient had any suicidal plan within the past 6 months prior to admission? : No Access to Means: No What has been your use of drugs/alcohol within the last 12 months?: Never used alcohol or drugs Previous Attempts/Gestures: No How many times?: 0 Other Self Harm Risks: denied Triggers for Past Attempts: None known Intentional Self Injurious Behavior: None Family Suicide History: No Recent stressful life event(s): (None reported) Persecutory voices/beliefs?: No Depression: No Depression Symptoms: (Denied) Substance abuse history and/or treatment for substance abuse?: No Suicide prevention  information given to non-admitted patients: Not applicable  Risk to Others within the past 6 months Homicidal Ideation: No Does patient have any lifetime  risk of violence toward others beyond the six months prior to admission? : No Thoughts of Harm to Others: No Current Homicidal Intent: No Current Homicidal Plan: No Access to Homicidal Means: No Identified Victim: None identified History of harm to others?: No Assessment of Violence: None Noted Does patient have access to weapons?: No Criminal Charges Pending?: No Does patient have a court date: No Is patient on probation?: No  Psychosis Hallucinations: None noted Delusions: None noted  Mental Status Report Appearance/Hygiene: Unremarkable Eye Contact: Fair Motor Activity: Unremarkable Speech: Logical/coherent Level of Consciousness: Quiet/awake Mood: Euthymic Affect: Appropriate to circumstance Anxiety Level: None Thought Processes: Coherent Judgement: Unimpaired Orientation: Appropriate for developmental age Obsessive Compulsive Thoughts/Behaviors: None  Cognitive Functioning Concentration: Unable to Assess Memory: Unable to Assess Is patient IDD: No Insight: Fair Impulse Control: Fair Appetite: Good Have you had any weight changes? : No Change Sleep: Decreased Vegetative Symptoms: None  ADLScreening [facility]) Patient's cognitive ability adequate to safely complete daily activities?: Yes Patient able to express need for assistance with ADLs?: Yes Independently performs ADLs?: Yes (appropriate for developmental age)  Prior Inpatient Therapy Prior Inpatient Therapy: No  Prior Outpatient Therapy Prior Outpatient Therapy: No Does patient have an ACCT team?: No Does patient have Intensive In-House Services?  : No Does patient have [facility] services? : No Does patient have P4CC services?: No  ADL Screening (condition at time of admission) Patient's cognitive ability adequate to safely complete daily activities?: Yes Is the patient deaf or have difficulty hearing?: No Does the patient have difficulty seeing, even when wearing glasses/contacts?:  Yes(Blind in one eye, prior diagnosis of glaucoma) Does the patient have difficulty concentrating, remembering, or making decisions?: Yes(Prior diagnosis of Dementia) Patient able to express need for assistance with ADLs?: Yes Does the patient have difficulty dressing or bathing?: Yes(Patient could use assistance with dression) Independently performs ADLs?: Yes (appropriate for developmental age) Does the patient have difficulty walking or climbing stairs?: No(Patient has a walker that she uses sometimes) Weakness of Legs: None(by patient report) Weakness of Arms/Hands: None  Home Assistive Devices/Equipment Home Assistive Devices/Equipment: Walker (specify type)    Abuse/Neglect Assessment (Assessment to be complete while patient is alone) Abuse/Neglect Assessment Can Be Completed: (No history of abuse reported)     Advance Directives (For Healthcare) Does Patient Have a Medical Advance Directive?: No          Disposition:  Disposition Initial Assessment Completed for this Encounter: Yes  On Site Evaluation by:   Reviewed with Physician:    Justice Deeds 10/17/2018 9:52 PM

## 2018-10-18 DIAGNOSIS — R443 Hallucinations, unspecified: Secondary | ICD-10-CM | POA: Diagnosis not present

## 2018-10-18 MED ORDER — QUETIAPINE FUMARATE 25 MG PO TABS
50.0000 mg | ORAL_TABLET | ORAL | Status: AC
  Administered 2018-10-18: 01:00:00 50 mg via ORAL
  Filled 2018-10-18: qty 2

## 2018-10-18 MED ORDER — QUETIAPINE FUMARATE 50 MG PO TABS: 50.0000 mg | ORAL_TABLET | Freq: Every day | ORAL | 2 refills | Status: AC

## 2018-10-18 NOTE — Discharge Instructions (Addendum)
Stephanie Graham does not have any acute medical issues that require hospitalization.  She was evaluated by a psychiatrist who agrees that her symptoms seem to be due to worsening dementia but may also have some medication side effects.  He recommends the following:  1)  Stop giving her Depakote (divalproex). 2)  Stop giving her Remeron (mirtazapine). 3)  Start giving her Seroquel (quetiapine) 50 mg each night at bedtime.  Please follow up with her primary care doctor.  You can also let Dr. Melrose Nakayama (neurology) know that these were the recommendations by psychiatry.  Continue the other medications she is currently taking.  Discuss with her primary care doctor if there are any geriatric psychiatrists to whom he might be able to refer her, or you may try calling [facility] to see if they have a psychiatrist she can see to help manage her dementia and medications.    Return to the emergency department if you develop new or worsening symptoms that concern you.

## 2018-10-18 NOTE — ED Notes (Signed)
Pt spit out one of the two tablets which landed on the floor. Will dispense replacement. Charge nurse notified.

## 2018-11-08 ENCOUNTER — Other Ambulatory Visit: Payer: Self-pay

## 2018-11-08 DIAGNOSIS — Z20822 Contact with and (suspected) exposure to covid-19: Secondary | ICD-10-CM

## 2018-11-10 LAB — NOVEL CORONAVIRUS, NAA: SARS-CoV-2, NAA: NOT DETECTED

## 2018-12-17 ENCOUNTER — Inpatient Hospital Stay
Admission: EM | Admit: 2018-12-17 | Discharge: 2018-12-21 | DRG: 521 | Disposition: A | Discharge status: Skilled Nursing Facility | Payer: Medicare Other | Attending: Family Medicine | Admitting: Family Medicine

## 2018-12-17 ENCOUNTER — Emergency Department: Payer: Medicare Other

## 2018-12-17 ENCOUNTER — Other Ambulatory Visit: Payer: Self-pay

## 2018-12-17 DIAGNOSIS — E119 Type 2 diabetes mellitus without complications: Secondary | ICD-10-CM

## 2018-12-17 DIAGNOSIS — I10 Essential (primary) hypertension: Secondary | ICD-10-CM | POA: Diagnosis present

## 2018-12-17 DIAGNOSIS — M810 Age-related osteoporosis without current pathological fracture: Secondary | ICD-10-CM | POA: Diagnosis present

## 2018-12-17 DIAGNOSIS — Z01811 Encounter for preprocedural respiratory examination: Secondary | ICD-10-CM

## 2018-12-17 DIAGNOSIS — Z8673 Personal history of transient ischemic attack (TIA), and cerebral infarction without residual deficits: Secondary | ICD-10-CM

## 2018-12-17 DIAGNOSIS — I639 Cerebral infarction, unspecified: Secondary | ICD-10-CM | POA: Diagnosis present

## 2018-12-17 DIAGNOSIS — Z20828 Contact with and (suspected) exposure to other viral communicable diseases: Secondary | ICD-10-CM | POA: Diagnosis present

## 2018-12-17 DIAGNOSIS — S72002A Fracture of unspecified part of neck of left femur, initial encounter for closed fracture: Principal | ICD-10-CM | POA: Diagnosis present

## 2018-12-17 DIAGNOSIS — W19XXXA Unspecified fall, initial encounter: Secondary | ICD-10-CM

## 2018-12-17 DIAGNOSIS — Z79899 Other long term (current) drug therapy: Secondary | ICD-10-CM

## 2018-12-17 DIAGNOSIS — Y92009 Unspecified place in unspecified non-institutional (private) residence as the place of occurrence of the external cause: Secondary | ICD-10-CM

## 2018-12-17 DIAGNOSIS — E43 Unspecified severe protein-calorie malnutrition: Secondary | ICD-10-CM | POA: Insufficient documentation

## 2018-12-17 DIAGNOSIS — W1830XA Fall on same level, unspecified, initial encounter: Secondary | ICD-10-CM | POA: Diagnosis present

## 2018-12-17 DIAGNOSIS — H5461 Unqualified visual loss, right eye, normal vision left eye: Secondary | ICD-10-CM | POA: Diagnosis present

## 2018-12-17 DIAGNOSIS — Z96652 Presence of left artificial knee joint: Secondary | ICD-10-CM | POA: Diagnosis present

## 2018-12-17 DIAGNOSIS — R410 Disorientation, unspecified: Secondary | ICD-10-CM | POA: Diagnosis present

## 2018-12-17 DIAGNOSIS — M25552 Pain in left hip: Secondary | ICD-10-CM | POA: Diagnosis not present

## 2018-12-17 DIAGNOSIS — D6959 Other secondary thrombocytopenia: Secondary | ICD-10-CM | POA: Diagnosis not present

## 2018-12-17 DIAGNOSIS — D62 Acute posthemorrhagic anemia: Secondary | ICD-10-CM

## 2018-12-17 DIAGNOSIS — F329 Major depressive disorder, single episode, unspecified: Secondary | ICD-10-CM | POA: Diagnosis present

## 2018-12-17 DIAGNOSIS — Z6821 Body mass index (BMI) 21.0-21.9, adult: Secondary | ICD-10-CM

## 2018-12-17 DIAGNOSIS — E1151 Type 2 diabetes mellitus with diabetic peripheral angiopathy without gangrene: Secondary | ICD-10-CM | POA: Diagnosis present

## 2018-12-17 DIAGNOSIS — H5440 Blindness, one eye, unspecified eye: Secondary | ICD-10-CM | POA: Insufficient documentation

## 2018-12-17 DIAGNOSIS — D696 Thrombocytopenia, unspecified: Secondary | ICD-10-CM

## 2018-12-17 DIAGNOSIS — Z7982 Long term (current) use of aspirin: Secondary | ICD-10-CM

## 2018-12-17 DIAGNOSIS — Z87891 Personal history of nicotine dependence: Secondary | ICD-10-CM

## 2018-12-17 DIAGNOSIS — R443 Hallucinations, unspecified: Secondary | ICD-10-CM | POA: Diagnosis present

## 2018-12-17 DIAGNOSIS — S72009A Fracture of unspecified part of neck of unspecified femur, initial encounter for closed fracture: Secondary | ICD-10-CM

## 2018-12-17 DIAGNOSIS — Z7984 Long term (current) use of oral hypoglycemic drugs: Secondary | ICD-10-CM

## 2018-12-17 DIAGNOSIS — F0391 Unspecified dementia with behavioral disturbance: Secondary | ICD-10-CM | POA: Diagnosis present

## 2018-12-17 DIAGNOSIS — F03918 Unspecified dementia, unspecified severity, with other behavioral disturbance: Secondary | ICD-10-CM

## 2018-12-17 LAB — BASIC METABOLIC PANEL
Anion gap: 8 (ref 5–15)
BUN: 14 mg/dL (ref 8–23)
CO2: 29 mmol/L (ref 22–32)
Calcium: 9.4 mg/dL (ref 8.9–10.3)
Chloride: 105 mmol/L (ref 98–111)
Creatinine, Ser: 1.03 mg/dL — ABNORMAL HIGH (ref 0.44–1.00)
GFR calc Af Amer: 54 mL/min — ABNORMAL LOW (ref >60)
GFR calc non Af Amer: 46 mL/min — ABNORMAL LOW (ref >60)
Glucose, Bld: 119 mg/dL — ABNORMAL HIGH (ref 70–99)
Potassium: 4.1 mmol/L (ref 3.5–5.1)
Sodium: 142 mmol/L (ref 135–145)

## 2018-12-17 LAB — CBC
HCT: 38.4 % (ref 36.0–46.0)
Hemoglobin: 11.9 g/dL — ABNORMAL LOW (ref 12.0–15.0)
MCH: 30.4 pg (ref 26.0–34.0)
MCHC: 31 g/dL (ref 30.0–36.0)
MCV: 98.2 fL (ref 80.0–100.0)
Platelets: 177 10*3/uL (ref 150–400)
RBC: 3.91 MIL/uL (ref 3.87–5.11)
RDW: 13.2 % (ref 11.5–15.5)
WBC: 4.9 10*3/uL (ref 4.0–10.5)
nRBC: 0 % (ref 0.0–0.2)

## 2018-12-17 MED ORDER — CEFAZOLIN SODIUM-DEXTROSE 2-4 GM/100ML-% IV SOLN
2.0000 g | INTRAVENOUS | Status: AC
  Administered 2018-12-18: 2 g via INTRAVENOUS
  Filled 2018-12-17: qty 100

## 2018-12-17 NOTE — H&P (Addendum)
History and Physical  Patient Name: Stephanie Graham     ZJQ:[id number]    DOB: 26-Aug-1921    DOA: 12/17/2018 Referring provider: Orthopedics PCP: Dion Body, MD  Outpatient specialists: Neurology, Psychiatry Patient coming from: Home to [facility] ED and then admitted to [facility]  Chief Complaint: Hip pain and fall  HPI: Stephanie Graham is a 85 y.o. female with a past medical history significant for dementia with occassional behavioral concerns, HTN, T2DM, R eye blindness who presents with hip pain after an unwitnessed mechanical fall.  The patient was in normal state of health until this evening when she was hallucinating seeing ants which she has done intermittently and the granddaughter heard a thud and went in and the patient had already picked her self back up to the chair but was having hip pain with difficulty walking.  In the ED, the patient was calm and a plain radiograph of the Left hip showed a Left femoral neck fracture with impaction and varus angulation.  The case was discussed with Orthopedics who agreed to see the patient, and TRH were asked to admit for medical management.    There was no preceding dizziness, weakness, lightheadedness or vertigo, no chest discomfort, palpitations, or dyspnea, nor are there any of those symptoms now.  The patient denies active heart issues, angina, or history of MI. She does not typically exert to an equivalent of 4 METS, but believes that she could without dyspnea.  Daughter states she will walk a lot at the grocery store with a buggy.  She has a history of a CVA, but denies any CAD, CHF, or DM treated with insulin. She has no history of COPD or symptoms of OSA, including snoring, daytime drowsiness, apnea.  She has no history of prolonged steroid use and does not use insulin.  She does not use alcohol, and has no history of withdrawal symptoms or delirium in the context of previous surgeries that I know of.  Anesthesia Specific  concerns: Presence of loose teeth: None Anesthesia problems in past: None  History of bleeding disorder: None  Review of Systems: Difficult to complete a full ROS because of the patients mental status and difficulty hearing. Review of Systems  Constitutional: Negative for chills and fever.  HENT: Positive for hearing loss.   Eyes: Positive for blurred vision.  Respiratory: Negative for cough, hemoptysis, sputum production, shortness of breath and wheezing.   Cardiovascular: Negative for chest pain and palpitations.  Gastrointestinal: Negative for abdominal pain, blood in stool, melena, nausea and vomiting.  Neurological: Negative for weakness and headaches.  Psychiatric/Behavioral: Positive for hallucinations.    Past Medical History:  Diagnosis Date  . 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 Diabetes mellitus without complication ([place])   . Hypertension     Past Surgical History:  Procedure Laterality Date  . REPLACEMENT TOTAL KNEE Left     Social History: Patient lives with her Granddaughter, the daughter of [name].  Patient walks okay.   reports that she has never smoked. She has quit using smokeless tobacco. She reports that she does not drink alcohol or use drugs.  Allergies  Allergen Reactions  . Hydrochlorothiazide W-Triamterene     Other reaction(s): Unknown  . Simvastatin     Other reaction(s): Unknown  . Verapamil     Other reaction(s): Unknown    Family history: denies a family history of osteoprososi  Prior to Admission medications   Medication Sig Start Date End Date Taking?  Authorizing Provider  acetaminophen (TYLENOL) 650 MG CR tablet Take 1,300 mg by mouth every 8 (eight) hours as needed for pain.   Yes [provider]  aspirin 325 MG tablet Take 1 tablet (325 mg total) by mouth daily. 01/20/17  Yes Altamese DillingVachhani, Vaibhavkumar, MD  carvedilol (COREG) 12.5 MG tablet Take 6.25 mg by mouth 2 (two) times daily.    Yes [provider]  donepezil (ARICEPT) 10 MG tablet Take 10 mg by mouth at bedtime. 02/06/16  Yes [provider]  loratadine (CLARITIN) 10 MG tablet Take 10 mg by mouth daily.   Yes [provider]  losartan (COZAAR) 25 MG tablet Take 25 mg by mouth daily.   Yes [provider]  metFORMIN (GLUCOPHAGE) 500 MG tablet Take 1 tablet (500 mg total) by mouth 2 (two) times daily. 02/24/18  Yes Mayo, Allyn KennerKaty Dodd, MD  pravastatin (PRAVACHOL) 40 MG tablet Take 80 mg by mouth at bedtime. 01/04/16  Yes [provider]  QUEtiapine (SEROQUEL) 50 MG tablet Take 1 tablet (50 mg total) by mouth at bedtime. 10/18/18  Yes Loleta RoseForbach, Cory, MD  sertraline (ZOLOFT) 25 MG tablet Take 25 mg by mouth 2 (two) times daily. Taking 25 mg in the morning and 25 mg (with 50 mg) at bedtime. 12/14/18  Yes [provider]  traZODone (DESYREL) 50 MG tablet Take 50 mg by mouth at bedtime as needed for sleep.   Yes [provider]  meloxicam (MOBIC) 7.5 MG tablet Take 7.5-15 mg by mouth daily as needed for pain.    [provider]  divalproex (DEPAKOTE SPRINKLE) 125 MG capsule Take 125-250 mg by mouth 2 (two) times daily.  10/07/18 10/18/18  [provider]  mirtazapine (REMERON) 7.5 MG tablet Take 7.5 mg by mouth at bedtime.  10/14/18 10/18/18  [provider]       Physical Exam: BP (!) 178/99 (BP Location: Left Arm)   Pulse 76   Temp 98.2 F (36.8 C) (Oral)   Resp 16   Ht 5\' 2"  (1.575 m)   Wt 54.4 kg   SpO2 95%   BMI 21.95 kg/m  General appearance: thin, gaunt appearing, but mildly strong appearing Eyes: Anicteric, localizes to voice ENT: MMM.    Skin: Warm and dry.  No jaundice.  No suspicious rashes or lesions. Cardiac: rrr w/o mrg, JVP normal and not visible. No LE edema.  Radial and pulses 2+ and symmetric.   Respiratory: nwob, ctab GI: Abdomen soft without rigidity. nbs MSK: leg foreshortened and externally rotated.  No effusions.  No clubbing  or cyanosis. Neuro: alert and oriented to self, but not place or time. Psych: The patient is oriented to time, place and person.  Behavior appropriate, pleasantly confused.   Labs on Admission:  I have personally reviewed following labs and imaging studies: CBC: Recent Labs  Lab 12/17/18 2118  WBC 4.9  HGB 11.9*  HCT 38.4  MCV 98.2  PLT 177   Basic Metabolic Panel: Recent Labs  Lab 12/17/18 2118  NA 142  K 4.1  CL 105  CO2 29  GLUCOSE 119*  BUN 14  CREATININE 1.03*  CALCIUM 9.4   GFR: Estimated Creatinine Clearance: 25.8 mL/min (A) (by C-G formula based on SCr of 1.03 mg/dL (H)).  Liver Function Tests: No results for input(s): AST, ALT, ALKPHOS, BILITOT, PROT, ALBUMIN in the last 168 hours. No results for input(s): LIPASE, AMYLASE in the last 168 hours. No results for input(s): AMMONIA in the last  168 hours. Coagulation Profile: Recent Labs  Lab 12/17/18 2118  INR 1.0   Cardiac Enzymes: No results for input(s): CKTOTAL, CKMB, CKMBINDEX, TROPONINI in the last 168 hours. BNP (last 3 results) No results for input(s): PROBNP in the last 8760 hours. HbA1C: No results for input(s): HGBA1C in the last 72 hours. CBG: No results for input(s): GLUCAP in the last 168 hours. Lipid Profile: No results for input(s): CHOL, HDL, LDLCALC, TRIG, CHOLHDL, LDLDIRECT in the last 72 hours. Thyroid Function Tests: No results for input(s): TSH, T4TOTAL, FREET4, T3FREE, THYROIDAB in the last 72 hours. Anemia Panel: No results for input(s): VITAMINB12, FOLATE, FERRITIN, TIBC, IRON, RETICCTPCT in the last 72 hours.    Radiological Exams on Admission: Personally reviewed: Dg Chest 1 View  Result Date: 12/17/2018 CLINICAL DATA:  Preop left hip fracture.  Fall. EXAM: CHEST  1 VIEW COMPARISON:  02/21/2018 FINDINGS: Cardiomegaly. Lungs clear. No effusions. No acute bony abnormality. IMPRESSION: Cardiomegaly.  No active disease. Electronically Signed   By: Charlett Nose M.D.   On:  12/17/2018 22:19   Ct Head Wo Contrast  Result Date: 12/17/2018 CLINICAL DATA:  Fall EXAM: CT HEAD WITHOUT CONTRAST TECHNIQUE: Contiguous axial images were obtained from the base of the skull through the vertex without intravenous contrast. COMPARISON:  02/21/2018 FINDINGS: Brain: There is atrophy and chronic small vessel disease changes. No acute intracranial abnormality. Specifically, no hemorrhage, hydrocephalus, mass lesion, acute infarction, or significant intracranial injury. Vascular: No hyperdense vessel or unexpected calcification. Skull: No acute calvarial abnormality. Sinuses/Orbits: Opacified left maxillary sinus and scattered ethmoid air cells. Mucosal thickening in the right maxillary sinus. Other: None IMPRESSION: Atrophy, chronic microvascular disease. No acute intracranial abnormality. Chronic sinusitis. Electronically Signed   By: Charlett Nose M.D.   On: 12/17/2018 22:21   Dg Hip Unilat With Pelvis 2-3 Views Left  Result Date: 12/17/2018 CLINICAL DATA:  Fall.  Left hip pain. EXAM: DG HIP (WITH OR WITHOUT PELVIS) 2-3V LEFT COMPARISON:  None. FINDINGS: There is a left femoral neck fracture with mild impaction and varus angulation. No subluxation or dislocation. IMPRESSION: Left femoral neck fracture with impaction and varus angulation. Electronically Signed   By: Charlett Nose M.D.   On: 12/17/2018 22:19    EKG: Independently reviewed. LBBB, normal rate, no notable ischemic changes.  EKG is similar to 09/2018 one.  I have some uncertainty about her operative risk but find nothing that can necessarily be optimized.  It is questionable whether she can achieve 4 METS but because she can walk with a buggy "all around the grocery store" feel it is close.  And patient only has one risk factor on the RCRI of a previous stroke meaning that I would not want to delay her surgery in the hopes of doing it as soon as possible and the patient can become ambulatory as soon as possible to increase the  chances of her rehabilitating and getting back to her somehwat functional status. -No further testing needed  Assessment and Plan: 1. Hip fracture: The patient will be seen by Orthopedics in the morning at [facility], to evaluate for operative fixation of the left hip.    #Osteoporosis Consider starting bisphosphonate if not contraindicated -Calcium and vitamin D was started, follow up on Vitamin D level  #Depression - patient just started Zoloft so I held, holding trazodone for sleep. #Dementia with occasional behavioral concerns, I decreased seroquel to  qhs instead of 75 to limit sedation prior to surgery.  Can try to increase back  to 75 if needed. Cont' 25 qam. #Hypertension, hypertensive from pain, expect will drift down with sleep/rest- hold ACE and continue coreg, consider hydral oral prn #T2DM - hold orals, SSI #Normocytic Anemia of 11.9, waxes and wanes in the outaptient, consider iron profile and b12 because of history of macrocytosis in 02/2018 #H/o CVA about a year ago which worsened the memory concerns - cont' ASA325mg    -Admit to med-surg bed -tylenol scheduled q8, and oxy 2.5mg  q6hr for severe pain.  Miralax & dulcolax prn -Bed rest, apply ice, document sedation and vitals per Hip fracture protocol -NPO at midnight -MIVF -Nutrition consulted -Hold following meds:  -Losartan for surgery.  --hold aricept if bradycardic  Pulmonary: Patient does not have diagnosed COPD or OSA. Patient is at average risk for pulmonary complications.   Incentive spirometry  Endocrine: Patient has no history of steroid use or insulin dependent DM.only on orals  Heme: Transfusion threshold 8 mg/dL.   Active Problems:   * No active hospital problems. *   Post-operative medical care: Per AAOS 2014 guidelines on hip fractures in the elderly: -Recommend osteoporosis screening after discharge if not done previously  DVT prophylaxis: SCDs and defer further to Orthopedics Diet: NPO Code  Status: NPO for now Family Communication: Para March who is the oldest of the children but doesn't seem to have HCPOA necessarily.  Disposition Plan: Anticipate evaluation by Orthopedics and surgical fixation tomorrow, then PT evaluation and discharge to SNF in 2-3 days.  Para March might decide to bring home to a fmiliar environment, I consulted Social Work to help make this decision for [place] Admission status: INPATIENT for hip fracture, medical surgical bed  Medical decision making and consults: Patient seen at 12:09 AM on 12/18/2018.  The patient was discussed with The ED provider. What exists of the patient's chart was reviewed in depth and summarized above.  Clinical condition: appropriate..    I spent 68 minutes on this admission.  Charlane Ferretti [facility]  Patient and/or daughter agreed completely to the plan.  They expressed understanding and I answered all questions.

## 2018-12-17 NOTE — ED Notes (Signed)
Admitting doctor at bedside 

## 2018-12-17 NOTE — ED Provider Notes (Signed)
[facility] Emergency Department Provider Note  Time seen: 9:37 PM  I have reviewed the triage vital signs and the nursing notes.   HISTORY  Chief Complaint Fall   HPI Stephanie Graham is a 85 y.o. female with a past medical history of diabetes, hypertension, presents to the emergency department after an unwitnessed fall at home.  According to EMS report and the patient she had a fall at home where she lives with her daughter.  EMS states the daughter found the patient on her buttocks complaining of left hip pain.  Unclear if the patient hit her head, unclear if there is any loss of consciousness.  Overall the patient appears well currently, no distress.   Past Medical History:  Diagnosis Date  . 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 Diabetes mellitus without complication (HCC)   . Hypertension     Patient Active Problem List   Diagnosis Date Noted  . Malnutrition of moderate degree 02/24/2018  . Hypothermia 02/21/2018  . 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 Syncope and collapse 01/18/2017  . HTN (hypertension) 01/18/2017  . Diabetes (HCC) 01/18/2017  . Nausea vomiting and diarrhea 01/18/2017    Past Surgical History:  Procedure Laterality Date  . REPLACEMENT TOTAL KNEE Left     Prior to Admission medications   Medication Sig Start Date End Date Taking? Authorizing Provider  acetaminophen (TYLENOL) 650 MG CR tablet Take 1,300 mg by mouth every 8 (eight) hours as needed for pain.    [provider]  aspirin 325 MG tablet Take 1 tablet (325 mg total) by mouth daily. 01/20/17   Altamese Dilling, MD  carvedilol (COREG) 12.5 MG tablet Take 6.25 mg by mouth 2 (two) times daily.     [provider]  donepezil (ARICEPT) 10 MG tablet Take 10 mg by mouth at bedtime. 02/06/16   [provider]  losartan (COZAAR) 25 MG tablet Take 25 mg by mouth daily.    [provider]  meloxicam (MOBIC)  7.5 MG tablet Take 7.5-15 mg by mouth daily as needed for pain.    [provider]  metFORMIN (GLUCOPHAGE) 500 MG tablet Take 1 tablet (500 mg total) by mouth 2 (two) times daily. 02/24/18   Mayo, Allyn Kenner, MD  pravastatin (PRAVACHOL) 40 MG tablet Take 80 mg by mouth at bedtime. 01/04/16   [provider]  QUEtiapine (SEROQUEL) 50 MG tablet Take 1 tablet (50 mg total) by mouth at bedtime. 10/18/18   Loleta Rose, MD  traZODone (DESYREL) 50 MG tablet Take 50 mg by mouth at bedtime as needed for sleep.    [provider]  divalproex (DEPAKOTE SPRINKLE) 125 MG capsule Take 125-250 mg by mouth 2 (two) times daily.  10/07/18 10/18/18  [provider]  mirtazapine (REMERON) 7.5 MG tablet Take 7.5 mg by mouth at bedtime.  10/14/18 10/18/18  [provider]    No Known Allergies  History reviewed. No pertinent family history.  Social History Social History   Tobacco Use  . Smoking status: Never Smoker  . Smokeless tobacco: Former Engineer, water Use Topics  . Alcohol use: No  . Drug use: No    Review of Systems Constitutional: Negative for fever. Cardiovascular: Negative for chest pain. Respiratory: Negative for shortness of breath. Gastrointestinal: Negative for abdominal pain Musculoskeletal: Negative for musculoskeletal complaints Neurological: Negative for headache All other ROS negative  ____________________________________________   PHYSICAL EXAM:  VITAL SIGNS: ED Triage  Vitals  Enc Vitals Group     BP 12/17/18 2109 (!) 178/99     Pulse Rate 12/17/18 2109 76     Resp 12/17/18 2109 16     Temp 12/17/18 2111 98.2 F (36.8 C)     Temp Source 12/17/18 2111 Oral     SpO2 12/17/18 2106 98 %     Weight 12/17/18 2111 120 lb (54.4 kg)     Height 12/17/18 2111 5\' 2"  (1.575 m)     Head Circumference --      Peak Flow --      Pain Score 12/17/18 2111 0     Pain Loc --      Pain Edu? --      Excl. in [place]? --    Constitutional: Alert and  oriented. Well appearing and in no distress. Eyes: Normal exam ENT      Head: Normocephalic and atraumatic.      Mouth/Throat: Mucous membranes are moist. Cardiovascular: Normal rate, regular rhythm.  Respiratory: Normal respiratory effort without tachypnea nor retractions. Breath sounds are clear Gastrointestinal: Soft and nontender. No distention.  Musculoskeletal: Nontender with normal range of motion in all extremities. Neurologic:  Normal speech and language. No gross focal neurologic deficits  Skin:  Skin is warm, dry and intact.  Psychiatric: Mood and affect are normal.  ____________________________________________    EKG  EKG viewed and interpreted by myself shows a normal sinus rhythm at 70 bpm with a widened QRS, normal axis, largely normal intervals, nonspecific ST changes.  ____________________________________________    RADIOLOGY  Left hip fracture. Chest x-ray negative CT head negative  ____________________________________________   INITIAL IMPRESSION / ASSESSMENT AND PLAN / ED COURSE  Pertinent labs & imaging results that were available during my care of the patient were reviewed by me and considered in my medical decision making (see chart for details).   Patient presents to the emergency department for a fall at home.  Currently the patient appears well does complain of pain in the left hip and left knee especially with movement of the hip/knee.  Head appears atraumatic.  No other injuries identified on my exam good range of motion in all other extremities.  We will check basic labs, CT scan of the head, left hip and pelvis x-rays.  We will continue to closely monitor while awaiting results.  X-ray consistent with left femoral neck fracture.  Discussed case with Dr. Christia Reading of orthopedics.  We will admit to the hospitalist.  Lab work largely [place].  [name] was evaluated in Emergency Department on 12/17/2018 for the symptoms described in the  history of present illness. She was evaluated in the context of the global COVID-19 pandemic, which necessitated consideration that the patient might be at risk for infection with the SARS-CoV-2 virus that causes COVID-19. Institutional protocols and algorithms that pertain to the evaluation of patients at risk for COVID-19 are in a state of rapid change based on information released by regulatory bodies including the CDC and federal and state organizations. These policies and algorithms were followed during the patient's care in the ED.  ____________________________________________   FINAL CLINICAL IMPRESSION(S) / ED DIAGNOSES  Fall Hip pain   Harvest Dark, MD 12/17/18 2229

## 2018-12-17 NOTE — ED Triage Notes (Signed)
Pt from home to ED via [facility] due to mechanical fall. Per EMS pt had an unwitnessed fall and was found sitting on her bottom. Unknown time of last seen prior to fall.   Pt c/o left sided hip pain and radiation to left leg. Per EMS swelling noted around left knee.   Pt hx of dementia, HTN alzheimers, DM   Upon arrival pt A&Ox4 and denies any pain.

## 2018-12-17 NOTE — ED Notes (Signed)
Patient transported to CT 

## 2018-12-18 ENCOUNTER — Inpatient Hospital Stay: Payer: Medicare Other

## 2018-12-18 ENCOUNTER — Inpatient Hospital Stay: Payer: Medicare Other | Admitting: Anesthesiology

## 2018-12-18 ENCOUNTER — Encounter: Payer: Self-pay | Admitting: Anesthesiology

## 2018-12-18 ENCOUNTER — Other Ambulatory Visit: Payer: Self-pay

## 2018-12-18 ENCOUNTER — Encounter
Admission: EM | Disposition: A | Discharge status: Skilled Nursing Facility | Payer: Self-pay | Source: Home / Self Care | Attending: Family Medicine

## 2018-12-18 DIAGNOSIS — Z7982 Long term (current) use of aspirin: Secondary | ICD-10-CM | POA: Diagnosis not present

## 2018-12-18 DIAGNOSIS — F329 Major depressive disorder, single episode, unspecified: Secondary | ICD-10-CM | POA: Diagnosis present

## 2018-12-18 DIAGNOSIS — S72002A Fracture of unspecified part of neck of left femur, initial encounter for closed fracture: Secondary | ICD-10-CM | POA: Diagnosis present

## 2018-12-18 DIAGNOSIS — R410 Disorientation, unspecified: Secondary | ICD-10-CM | POA: Diagnosis present

## 2018-12-18 DIAGNOSIS — E43 Unspecified severe protein-calorie malnutrition: Secondary | ICD-10-CM | POA: Diagnosis present

## 2018-12-18 DIAGNOSIS — H5461 Unqualified visual loss, right eye, normal vision left eye: Secondary | ICD-10-CM | POA: Diagnosis present

## 2018-12-18 DIAGNOSIS — E1151 Type 2 diabetes mellitus with diabetic peripheral angiopathy without gangrene: Secondary | ICD-10-CM | POA: Diagnosis present

## 2018-12-18 DIAGNOSIS — E119 Type 2 diabetes mellitus without complications: Secondary | ICD-10-CM | POA: Diagnosis not present

## 2018-12-18 DIAGNOSIS — Z6821 Body mass index (BMI) 21.0-21.9, adult: Secondary | ICD-10-CM | POA: Diagnosis not present

## 2018-12-18 DIAGNOSIS — W19XXXA Unspecified fall, initial encounter: Secondary | ICD-10-CM | POA: Diagnosis not present

## 2018-12-18 DIAGNOSIS — W1830XA Fall on same level, unspecified, initial encounter: Secondary | ICD-10-CM | POA: Diagnosis present

## 2018-12-18 DIAGNOSIS — Z87891 Personal history of nicotine dependence: Secondary | ICD-10-CM | POA: Diagnosis not present

## 2018-12-18 DIAGNOSIS — Z96652 Presence of left artificial knee joint: Secondary | ICD-10-CM | POA: Diagnosis present

## 2018-12-18 DIAGNOSIS — R443 Hallucinations, unspecified: Secondary | ICD-10-CM | POA: Diagnosis present

## 2018-12-18 DIAGNOSIS — Y92009 Unspecified place in unspecified non-institutional (private) residence as the place of occurrence of the external cause: Secondary | ICD-10-CM | POA: Diagnosis not present

## 2018-12-18 DIAGNOSIS — M25552 Pain in left hip: Secondary | ICD-10-CM | POA: Diagnosis present

## 2018-12-18 DIAGNOSIS — Z7984 Long term (current) use of oral hypoglycemic drugs: Secondary | ICD-10-CM | POA: Diagnosis not present

## 2018-12-18 DIAGNOSIS — M810 Age-related osteoporosis without current pathological fracture: Secondary | ICD-10-CM | POA: Diagnosis present

## 2018-12-18 DIAGNOSIS — Z20828 Contact with and (suspected) exposure to other viral communicable diseases: Secondary | ICD-10-CM | POA: Diagnosis present

## 2018-12-18 DIAGNOSIS — F0391 Unspecified dementia with behavioral disturbance: Secondary | ICD-10-CM | POA: Diagnosis present

## 2018-12-18 DIAGNOSIS — Z8673 Personal history of transient ischemic attack (TIA), and cerebral infarction without residual deficits: Secondary | ICD-10-CM | POA: Diagnosis not present

## 2018-12-18 DIAGNOSIS — D696 Thrombocytopenia, unspecified: Secondary | ICD-10-CM | POA: Diagnosis not present

## 2018-12-18 DIAGNOSIS — D6959 Other secondary thrombocytopenia: Secondary | ICD-10-CM | POA: Diagnosis not present

## 2018-12-18 DIAGNOSIS — F03918 Unspecified dementia, unspecified severity, with other behavioral disturbance: Secondary | ICD-10-CM

## 2018-12-18 DIAGNOSIS — I10 Essential (primary) hypertension: Secondary | ICD-10-CM | POA: Diagnosis present

## 2018-12-18 DIAGNOSIS — D62 Acute posthemorrhagic anemia: Secondary | ICD-10-CM | POA: Diagnosis not present

## 2018-12-18 DIAGNOSIS — Z79899 Other long term (current) drug therapy: Secondary | ICD-10-CM | POA: Diagnosis not present

## 2018-12-18 HISTORY — PX: HIP ARTHROPLASTY: SHX981

## 2018-12-18 HISTORY — DX: Unspecified fall, initial encounter: W19.XXXA

## 2018-12-18 HISTORY — DX: Unspecified place in unspecified non-institutional (private) residence as the place of occurrence of the external cause: Y92.009

## 2018-12-18 LAB — COMPREHENSIVE METABOLIC PANEL
ALT: 17 U/L (ref 0–44)
AST: 17 U/L (ref 15–41)
Albumin: 3.7 g/dL (ref 3.5–5.0)
Alkaline Phosphatase: 59 U/L (ref 38–126)
Anion gap: 11 (ref 5–15)
BUN: 15 mg/dL (ref 8–23)
CO2: 26 mmol/L (ref 22–32)
Calcium: 9 mg/dL (ref 8.9–10.3)
Chloride: 104 mmol/L (ref 98–111)
Creatinine, Ser: 0.76 mg/dL (ref 0.44–1.00)
GFR calc Af Amer: >60 mL/min (ref >60)
GFR calc non Af Amer: >60 mL/min (ref >60)
Glucose, Bld: 137 mg/dL — ABNORMAL HIGH (ref 70–99)
Potassium: 3.5 mmol/L (ref 3.5–5.1)
Sodium: 141 mmol/L (ref 135–145)
Total Bilirubin: 0.9 mg/dL (ref 0.3–1.2)
Total Protein: 6.8 g/dL (ref 6.5–8.1)

## 2018-12-18 LAB — GLUCOSE, CAPILLARY
Glucose-Capillary: 121 mg/dL — ABNORMAL HIGH (ref 70–99)
Glucose-Capillary: 80 mg/dL (ref 70–99)
Glucose-Capillary: 128 mg/dL — ABNORMAL HIGH (ref 70–99)
Glucose-Capillary: 142 mg/dL — ABNORMAL HIGH (ref 70–99)
Glucose-Capillary: 137 mg/dL — ABNORMAL HIGH (ref 70–99)

## 2018-12-18 LAB — HEMOGLOBIN A1C
Hgb A1c MFr Bld: 5.9 % — ABNORMAL HIGH (ref 4.8–5.6)
Mean Plasma Glucose: 122.63 mg/dL

## 2018-12-18 LAB — CBC
HCT: 36.4 % (ref 36.0–46.0)
Hemoglobin: 11.2 g/dL — ABNORMAL LOW (ref 12.0–15.0)
MCH: 31.2 pg (ref 26.0–34.0)
MCHC: 30.8 g/dL (ref 30.0–36.0)
MCV: 101.4 fL — ABNORMAL HIGH (ref 80.0–100.0)
Platelets: 158 10*3/uL (ref 150–400)
RBC: 3.59 MIL/uL — ABNORMAL LOW (ref 3.87–5.11)
RDW: 13.1 % (ref 11.5–15.5)
WBC: 8 10*3/uL (ref 4.0–10.5)
nRBC: 0 % (ref 0.0–0.2)

## 2018-12-18 LAB — APTT: aPTT: 32 seconds (ref 24–36)

## 2018-12-18 LAB — PROTIME-INR
INR: 1 (ref 0.8–1.2)
Prothrombin Time: 13.1 seconds (ref 11.4–15.2)

## 2018-12-18 LAB — VITAMIN D 25 HYDROXY (VIT D DEFICIENCY, FRACTURES): Vit D, 25-Hydroxy: 37.8 ng/mL (ref 30–100)

## 2018-12-18 LAB — SARS CORONAVIRUS 2 (TAT 6-24 HRS): SARS Coronavirus 2: NEGATIVE

## 2018-12-18 LAB — SURGICAL PCR SCREEN
MRSA, PCR: NEGATIVE
Staphylococcus aureus: POSITIVE — AB

## 2018-12-18 LAB — POC SARS CORONAVIRUS 2 AG: SARS Coronavirus 2 Ag: NEGATIVE

## 2018-12-18 LAB — POC SARS CORONAVIRUS 2 AG -  ED: SARS Coronavirus 2 Ag: NEGATIVE

## 2018-12-18 SURGERY — HEMIARTHROPLASTY, HIP, DIRECT ANTERIOR APPROACH, FOR FRACTURE
Anesthesia: Spinal | Site: Hip | Laterality: Left

## 2018-12-18 MED ORDER — SENNOSIDES-DOCUSATE SODIUM 8.6-50 MG PO TABS: 1.0000 | ORAL_TABLET | Freq: Every evening | ORAL | Status: DC | PRN

## 2018-12-18 MED ORDER — ONDANSETRON HCL 4 MG PO TABS
4.0000 mg | ORAL_TABLET | Freq: Four times a day (QID) | ORAL | Status: DC | PRN
  Filled 2018-12-18 (×3): qty 1

## 2018-12-18 MED ORDER — DONEPEZIL HCL 5 MG PO TABS
10.0000 mg | ORAL_TABLET | Freq: Every day | ORAL | Status: DC
  Administered 2018-12-18 – 2018-12-21 (×4): 10 mg via ORAL
  Filled 2018-12-18 (×4): qty 2

## 2018-12-18 MED ORDER — OXYCODONE HCL 5 MG PO TABS
2.5000 mg | ORAL_TABLET | Freq: Four times a day (QID) | ORAL | Status: DC | PRN
  Administered 2018-12-21: 2.5 mg via ORAL
  Filled 2018-12-18: qty 1

## 2018-12-18 MED ORDER — ACETAMINOPHEN 500 MG PO TABS
1000.0000 mg | ORAL_TABLET | Freq: Three times a day (TID) | ORAL | Status: DC
  Administered 2018-12-18 – 2018-12-21 (×9): 1000 mg via ORAL
  Filled 2018-12-18 (×11): qty 2

## 2018-12-18 MED ORDER — PROPOFOL 500 MG/50ML IV EMUL
INTRAVENOUS | Status: AC
  Filled 2018-12-18: qty 50

## 2018-12-18 MED ORDER — TRAMADOL HCL 50 MG PO TABS
50.0000 mg | ORAL_TABLET | Freq: Four times a day (QID) | ORAL | Status: DC
  Administered 2018-12-18 – 2018-12-20 (×4): 50 mg via ORAL
  Filled 2018-12-18 (×4): qty 1

## 2018-12-18 MED ORDER — HYDROCODONE-ACETAMINOPHEN 5-325 MG PO TABS
1.0000 | ORAL_TABLET | ORAL | Status: DC | PRN
  Administered 2018-12-18 – 2018-12-21 (×5): 1 via ORAL
  Filled 2018-12-18 (×5): qty 1
  Filled 2018-12-18: qty 2

## 2018-12-18 MED ORDER — METHOCARBAMOL 1000 MG/10ML IJ SOLN
500.0000 mg | Freq: Four times a day (QID) | INTRAVENOUS | Status: DC | PRN
  Filled 2018-12-18: qty 5

## 2018-12-18 MED ORDER — PRAVASTATIN SODIUM 20 MG PO TABS
80.0000 mg | ORAL_TABLET | Freq: Every day | ORAL | Status: DC
  Administered 2018-12-18 – 2018-12-21 (×4): 80 mg via ORAL
  Filled 2018-12-18 (×4): qty 4

## 2018-12-18 MED ORDER — GLYCOPYRROLATE 0.2 MG/ML IJ SOLN
INTRAMUSCULAR | Status: AC
  Filled 2018-12-18: qty 1

## 2018-12-18 MED ORDER — PROPOFOL 500 MG/50ML IV EMUL
INTRAVENOUS | Status: DC | PRN
  Administered 2018-12-18: 35 ug/kg/min via INTRAVENOUS

## 2018-12-18 MED ORDER — ONDANSETRON HCL 4 MG/2ML IJ SOLN: 4.0000 mg | Freq: Once | INTRAMUSCULAR | Status: DC | PRN

## 2018-12-18 MED ORDER — BISACODYL 10 MG RE SUPP
10.0000 mg | Freq: Every day | RECTAL | Status: DC | PRN
  Administered 2018-12-20: 10 mg via RECTAL
  Filled 2018-12-18: qty 1

## 2018-12-18 MED ORDER — POTASSIUM CHLORIDE IN NACL 20-0.45 MEQ/L-% IV SOLN
INTRAVENOUS | Status: DC
  Filled 2018-12-18 (×2): qty 1000

## 2018-12-18 MED ORDER — LIDOCAINE HCL (PF) 2 % IJ SOLN
INTRAMUSCULAR | Status: AC
  Filled 2018-12-18: qty 10

## 2018-12-18 MED ORDER — LORATADINE 10 MG PO TABS
10.0000 mg | ORAL_TABLET | Freq: Every day | ORAL | Status: DC
  Administered 2018-12-19 – 2018-12-21 (×3): 10 mg via ORAL
  Filled 2018-12-18 (×3): qty 1

## 2018-12-18 MED ORDER — POLYETHYLENE GLYCOL 3350 17 G PO PACK: 17.0000 g | PACK | Freq: Every day | ORAL | Status: DC | PRN

## 2018-12-18 MED ORDER — ALUM & MAG HYDROXIDE-SIMETH 200-200-20 MG/5ML PO SUSP: 30.0000 mL | ORAL | Status: DC | PRN

## 2018-12-18 MED ORDER — SENNA 8.6 MG PO TABS
1.0000 | ORAL_TABLET | Freq: Two times a day (BID) | ORAL | Status: DC
  Administered 2018-12-18 – 2018-12-21 (×7): 8.6 mg via ORAL
  Filled 2018-12-18 (×7): qty 1

## 2018-12-18 MED ORDER — INSULIN ASPART 100 UNIT/ML ~~LOC~~ SOLN
0.0000 [IU] | Freq: Three times a day (TID) | SUBCUTANEOUS | Status: DC
  Administered 2018-12-18 (×2): 1 [IU] via SUBCUTANEOUS
  Administered 2018-12-19: 2 [IU] via SUBCUTANEOUS
  Administered 2018-12-19 – 2018-12-21 (×5): 1 [IU] via SUBCUTANEOUS
  Administered 2018-12-21: 3 [IU] via SUBCUTANEOUS
  Filled 2018-12-18 (×9): qty 1

## 2018-12-18 MED ORDER — QUETIAPINE FUMARATE 25 MG PO TABS
25.0000 mg | ORAL_TABLET | ORAL | Status: DC
  Administered 2018-12-19: 25 mg via ORAL
  Filled 2018-12-18: qty 1

## 2018-12-18 MED ORDER — FENTANYL CITRATE (PF) 100 MCG/2ML IJ SOLN
INTRAMUSCULAR | Status: DC | PRN
  Administered 2018-12-18 (×4): 25 ug via INTRAVENOUS

## 2018-12-18 MED ORDER — SERTRALINE HCL 50 MG PO TABS
25.0000 mg | ORAL_TABLET | Freq: Two times a day (BID) | ORAL | Status: DC
  Administered 2018-12-18 – 2018-12-21 (×7): 25 mg via ORAL
  Filled 2018-12-18 (×7): qty 1

## 2018-12-18 MED ORDER — NEOMYCIN-POLYMYXIN B GU 40-200000 IR SOLN
Status: DC | PRN
  Administered 2018-12-18: 16 mL

## 2018-12-18 MED ORDER — CARVEDILOL 3.125 MG PO TABS
6.2500 mg | ORAL_TABLET | Freq: Two times a day (BID) | ORAL | Status: DC
  Administered 2018-12-18 – 2018-12-21 (×7): 6.25 mg via ORAL
  Filled 2018-12-18 (×7): qty 2

## 2018-12-18 MED ORDER — CEFAZOLIN SODIUM-DEXTROSE 1-4 GM/50ML-% IV SOLN
1.0000 g | Freq: Four times a day (QID) | INTRAVENOUS | Status: AC
  Administered 2018-12-18 (×2): 1 g via INTRAVENOUS
  Filled 2018-12-18 (×2): qty 50

## 2018-12-18 MED ORDER — SUCCINYLCHOLINE CHLORIDE 20 MG/ML IJ SOLN
INTRAMUSCULAR | Status: AC
  Filled 2018-12-18: qty 1

## 2018-12-18 MED ORDER — LACTATED RINGERS IV SOLN
INTRAVENOUS | Status: DC | PRN
  Administered 2018-12-18 (×2): via INTRAVENOUS

## 2018-12-18 MED ORDER — PROPOFOL 10 MG/ML IV BOLUS
INTRAVENOUS | Status: DC | PRN
  Administered 2018-12-18: 10 mg via INTRAVENOUS

## 2018-12-18 MED ORDER — MORPHINE SULFATE (PF) 4 MG/ML IV SOLN: 0.5000 mg | INTRAVENOUS | Status: DC | PRN

## 2018-12-18 MED ORDER — MAGNESIUM CITRATE PO SOLN
1.0000 | Freq: Once | ORAL | Status: DC | PRN
  Filled 2018-12-18: qty 296

## 2018-12-18 MED ORDER — ONDANSETRON HCL 4 MG/2ML IJ SOLN: 4.0000 mg | Freq: Four times a day (QID) | INTRAMUSCULAR | Status: DC | PRN

## 2018-12-18 MED ORDER — PHENYLEPHRINE HCL (PRESSORS) 10 MG/ML IV SOLN
INTRAVENOUS | Status: AC
  Filled 2018-12-18: qty 1

## 2018-12-18 MED ORDER — EPHEDRINE SULFATE 50 MG/ML IJ SOLN
INTRAMUSCULAR | Status: DC | PRN
  Administered 2018-12-18: 5 mg via INTRAVENOUS

## 2018-12-18 MED ORDER — METHOCARBAMOL 500 MG PO TABS: 500.0000 mg | ORAL_TABLET | Freq: Four times a day (QID) | ORAL | Status: DC | PRN

## 2018-12-18 MED ORDER — SODIUM CHLORIDE 0.9 % IV SOLN
INTRAVENOUS | Status: DC | PRN
  Administered 2018-12-18: 25 ug/min via INTRAVENOUS

## 2018-12-18 MED ORDER — CEFAZOLIN SODIUM 1 G IJ SOLR
INTRAMUSCULAR | Status: AC
  Filled 2018-12-18: qty 20

## 2018-12-18 MED ORDER — METFORMIN HCL 500 MG PO TABS
500.0000 mg | ORAL_TABLET | Freq: Two times a day (BID) | ORAL | Status: DC
  Administered 2018-12-18 – 2018-12-19 (×2): 500 mg via ORAL
  Filled 2018-12-18 (×2): qty 1

## 2018-12-18 MED ORDER — LOSARTAN POTASSIUM 25 MG PO TABS
25.0000 mg | ORAL_TABLET | Freq: Every day | ORAL | Status: DC
  Administered 2018-12-18: 25 mg via ORAL
  Filled 2018-12-18: qty 1

## 2018-12-18 MED ORDER — DOCUSATE SODIUM 100 MG PO CAPS
100.0000 mg | ORAL_CAPSULE | Freq: Two times a day (BID) | ORAL | Status: DC
  Administered 2018-12-18 – 2018-12-21 (×7): 100 mg via ORAL
  Filled 2018-12-18 (×7): qty 1

## 2018-12-18 MED ORDER — GLYCOPYRROLATE 0.2 MG/ML IJ SOLN
INTRAMUSCULAR | Status: DC | PRN
  Administered 2018-12-18: 0.2 mg via INTRAVENOUS

## 2018-12-18 MED ORDER — CALCIUM CARBONATE-VITAMIN D 500-200 MG-UNIT PO TABS
2.0000 | ORAL_TABLET | Freq: Every day | ORAL | Status: DC
  Administered 2018-12-19 – 2018-12-21 (×3): 2 via ORAL
  Filled 2018-12-18 (×3): qty 2

## 2018-12-18 MED ORDER — ENOXAPARIN SODIUM 40 MG/0.4ML ~~LOC~~ SOLN: 40.0000 mg | SUBCUTANEOUS | Status: DC

## 2018-12-18 MED ORDER — ACETAMINOPHEN 325 MG PO TABS: 325.0000 mg | ORAL_TABLET | Freq: Four times a day (QID) | ORAL | Status: DC | PRN

## 2018-12-18 MED ORDER — ROCURONIUM BROMIDE 50 MG/5ML IV SOLN
INTRAVENOUS | Status: AC
  Filled 2018-12-18: qty 1

## 2018-12-18 MED ORDER — SODIUM CHLORIDE 0.9 % IV SOLN
75.0000 mL/h | INTRAVENOUS | Status: DC
  Administered 2018-12-18: 75 mL/h via INTRAVENOUS

## 2018-12-18 MED ORDER — SODIUM CHLORIDE (PF) 0.9 % IJ SOLN
INTRAMUSCULAR | Status: AC
  Filled 2018-12-18: qty 20

## 2018-12-18 MED ORDER — KETOROLAC TROMETHAMINE 15 MG/ML IJ SOLN
7.5000 mg | Freq: Four times a day (QID) | INTRAMUSCULAR | Status: DC
  Administered 2018-12-18 – 2018-12-19 (×2): 7.5 mg via INTRAVENOUS
  Filled 2018-12-18 (×2): qty 1

## 2018-12-18 MED ORDER — EPHEDRINE SULFATE 50 MG/ML IJ SOLN
INTRAMUSCULAR | Status: AC
  Filled 2018-12-18: qty 1

## 2018-12-18 MED ORDER — FENTANYL CITRATE (PF) 100 MCG/2ML IJ SOLN: 25.0000 ug | INTRAMUSCULAR | Status: DC | PRN

## 2018-12-18 MED ORDER — QUETIAPINE FUMARATE 25 MG PO TABS
50.0000 mg | ORAL_TABLET | Freq: Every day | ORAL | Status: DC
  Administered 2018-12-18 – 2018-12-21 (×4): 50 mg via ORAL
  Filled 2018-12-18 (×4): qty 2

## 2018-12-18 MED ORDER — FENTANYL CITRATE (PF) 100 MCG/2ML IJ SOLN
INTRAMUSCULAR | Status: AC
  Filled 2018-12-18: qty 2

## 2018-12-18 MED ORDER — ASPIRIN EC 325 MG PO TBEC
325.0000 mg | DELAYED_RELEASE_TABLET | Freq: Every day | ORAL | Status: DC
  Administered 2018-12-19 – 2018-12-21 (×3): 325 mg via ORAL
  Filled 2018-12-18 (×3): qty 1

## 2018-12-18 MED ORDER — TRAZODONE HCL 50 MG PO TABS
50.0000 mg | ORAL_TABLET | Freq: Every evening | ORAL | Status: DC | PRN
  Administered 2018-12-20: 50 mg via ORAL
  Filled 2018-12-18: qty 1

## 2018-12-18 SURGICAL SUPPLY — 56 items
BLADE SAGITTAL WIDE XTHICK NO (BLADE) ×2 IMPLANT
BLADE SURG SZ10 CARB STEEL (BLADE) ×2 IMPLANT
BNDG COHESIVE 4X5 TAN STRL (GAUZE/BANDAGES/DRESSINGS) ×2 IMPLANT
CANISTER SUCT 1200ML W/VALVE (MISCELLANEOUS) ×2 IMPLANT
CANISTER SUCT 3000ML PPV (MISCELLANEOUS) ×4 IMPLANT
COVER BACK TABLE REUSABLE LG (DRAPES) ×2 IMPLANT
COVER WAND RF STERILE (DRAPES) ×2 IMPLANT
DRAPE 3/4 80X56 (DRAPES) ×4 IMPLANT
DRAPE IMP U-DRAPE 54X76 (DRAPES) ×2 IMPLANT
DRAPE INCISE IOBAN 66X60 STRL (DRAPES) ×2 IMPLANT
DRAPE SPLIT 6X30 W/TAPE (DRAPES) ×2 IMPLANT
DRAPE SURG 17X11 SM STRL (DRAPES) ×2 IMPLANT
DRSG OPSITE POSTOP 4X10 (GAUZE/BANDAGES/DRESSINGS) ×2 IMPLANT
DURAPREP 26ML APPLICATOR (WOUND CARE) ×8 IMPLANT
ELECT BLADE 6.5 EXT (BLADE) ×2 IMPLANT
ELECT CAUTERY BLADE 6.4 (BLADE) ×2 IMPLANT
ELECT REM PT RETURN 9FT ADLT (ELECTROSURGICAL) ×2
ELECTRODE REM PT RTRN 9FT ADLT (ELECTROSURGICAL) ×1 IMPLANT
GAUZE SPONGE 4X4 12PLY STRL (GAUZE/BANDAGES/DRESSINGS) ×2 IMPLANT
GAUZE XEROFORM 1X8 LF (GAUZE/BANDAGES/DRESSINGS) ×2 IMPLANT
GLOVE BIOGEL PI IND STRL 9 (GLOVE) ×1 IMPLANT
GLOVE BIOGEL PI INDICATOR 9 (GLOVE) ×1
GLOVE SURG 9.0 ORTHO LTXF (GLOVE) ×4 IMPLANT
GOWN STRL REUS TWL 2XL XL LVL4 (GOWN DISPOSABLE) ×2 IMPLANT
GOWN STRL REUS W/ TWL LRG LVL3 (GOWN DISPOSABLE) ×1 IMPLANT
GOWN STRL REUS W/TWL LRG LVL3 (GOWN DISPOSABLE) ×1
HEAD MODULAR ENDO (Orthopedic Implant) ×1 IMPLANT
HEAD UNPLR 46XMDLR STRL HIP (Orthopedic Implant) ×1 IMPLANT
HEMOVAC 400ML (MISCELLANEOUS) ×2
HOLDER FOLEY CATH W/STRAP (MISCELLANEOUS) ×2 IMPLANT
KIT DRAIN HEMOVAC JP 7FR 400ML (MISCELLANEOUS) ×1 IMPLANT
KIT TURNOVER KIT A (KITS) ×2 IMPLANT
NDL SAFETY ECLIPSE 18X1.5 (NEEDLE) ×1 IMPLANT
NEEDLE FILTER BLUNT 18X 1/2SAF (NEEDLE) ×1
NEEDLE FILTER BLUNT 18X1 1/2 (NEEDLE) ×1 IMPLANT
NEEDLE HYPO 18GX1.5 SHARP (NEEDLE) ×1
NEEDLE MAYO CATGUT SZ4 (NEEDLE) ×2 IMPLANT
NS IRRIG 1000ML POUR BTL (IV SOLUTION) ×2 IMPLANT
PACK HIP PROSTHESIS (MISCELLANEOUS) ×2 IMPLANT
PAD ABD DERMACEA PRESS 5X9 (GAUZE/BANDAGES/DRESSINGS) ×4 IMPLANT
PILLOW ABDUC SM (MISCELLANEOUS) ×2 IMPLANT
PULSAVAC PLUS IRRIG FAN TIP (DISPOSABLE) ×2
RETRIEVER SUT HEWSON (MISCELLANEOUS) ×2 IMPLANT
SLEEVE UNITRAX (Orthopedic Implant) ×2 IMPLANT
SOL .9 NS 3000ML IRR  AL (IV SOLUTION) ×1
SOL .9 NS 3000ML IRR UROMATIC (IV SOLUTION) ×1 IMPLANT
STAPLER SKIN PROX 35W (STAPLE) ×2 IMPLANT
STEM HIP 4 127DEG (Stem) ×2 IMPLANT
SUT TICRON 2-0 30IN 311381 (SUTURE) ×8 IMPLANT
SUT VIC AB 0 CT1 36 (SUTURE) ×2 IMPLANT
SUT VIC AB 2-0 CT2 27 (SUTURE) ×4 IMPLANT
SYR 10ML LL (SYRINGE) ×2 IMPLANT
TAPE MICROFOAM 4IN (TAPE) ×2 IMPLANT
TAPE TRANSPORE STRL 2 31045 (GAUZE/BANDAGES/DRESSINGS) ×2 IMPLANT
TIP BRUSH PULSAVAC PLUS 24.33 (MISCELLANEOUS) ×2 IMPLANT
TIP FAN IRRIG PULSAVAC PLUS (DISPOSABLE) ×1 IMPLANT

## 2018-12-18 NOTE — Op Note (Signed)
12/18/2018  1:04 PM  PATIENT:  Stephanie Graham   MRN: [id number]  PRE-OPERATIVE DIAGNOSIS: Closed, displaced left femoral neck hip fracture  POST-OPERATIVE DIAGNOSIS: Closed, displaced left femoral neck hip fracture  PROCEDURE:  Procedure(s): LEFT HIP HEMIARTHROPLASTY   PREOPERATIVE INDICATIONS:    Stephanie Graham is an 85 y.o. female who was admitted with a diagnosis of left hip fracture.  I have recommended surgical fixation with hemiarthroplasty for this injury.  Given the patient's dementia, I have explained the surgery and the postoperative course to the patient's family who have agreed with surgical management of this fracture.    The risks benefits and alternatives were discussed with the  family including but not limited to the risks of  infection requiring removal of the prosthesis, bleeding requiring blood transfusion, nerve injury especially to the sciatic nerve leading to foot drop or lower extremity numbness, periprosthetic fracture, dislocation leg length discrepancy, change in lower extremity rotation persistent hip pain, loosening or failure of the components and the need for revision surgery. Medical risks include but are not limited to DVT and pulmonary embolism, myocardial infarction, stroke, pneumonia, respiratory failure and death.  OPERATIVE REPORT     SURGEON:  Thornton Park, MD    ANESTHESIA: Spinal    COMPLICATIONS:  None.   SPECIMEN: Femoral head to pathology    COMPONENTS:  [employer]ker Accolade 2 femoral component size 4 with a 46 mm -4 neck adjustment sleeve.    PROCEDURE IN DETAIL:   The patient was met in the holding area and  identified.  The appropriate hip was identified and marked at the operative site.  The patient was then transported to the OR  and  underwent spinal anesthesia.  The patient was then placed in the lateral decubitus position with the operative side up and secured on the operating room table with a pegboard and all bony  prominences were adequately padded. This included an axillary roll and additional padding around the nonoperative leg to prevent compression to the common peroneal nerve.    The operative lower extremity was prepped and draped in a sterile fashion.  A time out was performed prior to incision to verify patient's name, date of birth, medical record number, correct site of surgery correct procedure to be performed. The timeout was also used to verify the patient received antibiotics now appropriate instruments, implants and radiographic studies were available in the room. Once all in attendance were in agreement case began.    A posterolateral approach was utilized via sharp dissection  carried down to the subcutaneous tissue.  Bleeding vessels were coagulated using electrocautery.  The fascia lata was identified and incised along the length of the skin incision.  The gluteus maximus muscle was then split in line with its fibers. Self-retaining retractors were  inserted.  With the hip internally rotated, the short external rotators  were identified and removed from the posterior attachment from the greater trochanter. The piriformis was tagged for later repair. The capsule was identified and a T-shaped capsulotomy was performed. The capsule was tagged with #2 Tycron for later repair.  The femoral neck fracture was exposed, and the femoral head was removed using a corkscrew device. This was measured to be 46 mm in diameter. The attention was then turned to proximal femur preparation.  An oscillating saw was used to perform a proximal femoral osteotomy 1 fingerbreadth above the lesser trochanter. The trial 46 mm femoral head was placed into the acetabulum and had an excellent suction fit. The  attention was then turned back to femoral preparation.    A femoral skid and Cobra retractor were placed under the femoral neck to allow for adequate visualization. A box osteotome was used to make the initial entry into the  proximal femur. A single hand reamer was used to prepare the femoral canal. A T-shaped femoral canal sounder was then used to ensure no penetration femoral cortex had occurred during reaming. The proximal femur was then sequentially broached by hand. A size 4 femoral trial broach was found to have best medial to lateral canal fit. Once adequate mediolateral canal fill was achieved the trial femoral broach, neck, and head was assembled and the hip was reduced. It was found to have excellent stability, equivalent leg lengths with functional range of motion. The trial components were then removed.  I copiously irrigated the femoral canal and then impacted the real femoral prosthesis into place into the appropriate version, slightly anteverted to the normal anatomy, and I impacted the actual 46 mm Unitrax femoral component with a -4 neck adjustment sleeve into place. The hip was then reduced and taken through functional range of motion and found to have excellent stability. Leg lengths were restored. The hip joint was copiously irrigated.   A soft tissue repair of the capsule and external rotators was performed using #2 Tycron Excellent posterior capsular repair was achieved. The fascia lata was then closed with interrupted 0 Vicryl suture. The subcutaneous tissues were closed with 2-0 Vicryl and the skin approximated with staples.   The patient was then placed supine on the operative table. Leg lengths were checked clinically and found to be equivalent. An abduction pillow was placed between the lower extremities. The patient was then transferred to a hospital bed and brought to the PACU in stable condition. I was scrubbed and present the entire case and all sharp and instrument counts were correct at the conclusion of the case. I spoke with the patient's daughter via phone from the PACU to let her know the case was completed without complication patient was stable in recovery room.   Timoteo Gaul,  MD Orthopedic Surgeon

## 2018-12-18 NOTE — Anesthesia Preprocedure Evaluation (Signed)
Anesthesia Evaluation  Patient identified by MRN, date of birth, ID band Patient awake    Reviewed: Allergy & Precautions, NPO status , Patient's Chart, lab work & pertinent test results, reviewed documented beta blocker date and time   Airway Mallampati: II  TM Distance: >3 FB     Dental  (+) Chipped   Pulmonary           Cardiovascular hypertension, Pt. on medications and Pt. on home beta blockers      Neuro/Psych CVA    GI/Hepatic   Endo/Other  diabetes, Type 2  Renal/GU      Musculoskeletal   Abdominal   Peds  Hematology   Anesthesia Other Findings Pt. Denies CVA. R eye blind. Last echo 55-60. Lbbb and LVH. Hx of PVCs. Spinal acceptable.  Reproductive/Obstetrics                             Anesthesia Physical Anesthesia Plan  ASA: III  Anesthesia Plan: Spinal   Post-op Pain Management:    Induction: Intravenous  PONV Risk Score and Plan:   Airway Management Planned:   Additional Equipment:   Intra-op Plan:   Post-operative Plan:   Informed Consent: I have reviewed the patients History and Physical, chart, labs and discussed the procedure including the risks, benefits and alternatives for the proposed anesthesia with the patient or authorized representative who has indicated his/her understanding and acceptance.       Plan Discussed with: CRNA  Anesthesia Plan Comments:         Anesthesia Quick Evaluation

## 2018-12-18 NOTE — Progress Notes (Signed)
PROGRESS NOTE  Stephanie Graham ZOX:[id number] DOB: 1921-03-15 DOA: 12/17/2018 PCP: Dion Body, MD  Brief History   85 year old woman from home (lives with daughter) PMH diabetes mellitus type 2, dementia, right eye blindness presented after an unwitnessed mechanical fall resulting in left hip pain.  Per report patient was hallucinating (this happens intermittently) when granddaughter heard a noise and found her on the floor.  Imaging revealed left femoral neck fracture and the patient was admitted for definitive management per orthopedics.  A & P  Left femoral neck fracture status post fall at home.  As per H&P, patient cleared for surgery with no further testing recommended. --Plan for surgery today.  Diabetes mellitus type 2 --Hold Metformin --Appears stable.  Continue sliding scale insulin.  Essential hypertension --Appears stable.  Continue carvedilol.  Dementia with behavioral disturbance (hallucinations), depression --Appears stable.  Continue Aricept, quetiapine, Depakote, Remeron  PMH: Stroke, left bundle branch block --Continue Pravachol, aspirin  . Overall appears stable for surgery.  DVT prophylaxis: Per orthopedics Code Status: Full code Family Communication: none Disposition Plan: pending therapy evaluations post-op    Murray Hodgkins, MD  [facility] Direct contact: see [email or web address] (further directions at bottom of note if needed) 7PM-7AM contact night coverage as at bottom of note 12/18/2018, 11:57 AM  LOS: 0 days   Significant Hospital Events   . 11/27 admitted for left hip fracture   Consults:  . Orthopedics   Procedures:  .   Significant Diagnostic Tests:  . 11/27 hip film left femoral neck fracture, chest x-ray no acute disease, CT head no acute disease, EKG SR, LBBB, no acute changes (10/17/2018)   Micro Data:  .    Antimicrobials:  .   Interval History/Subjective  Seems confused but she denies pain or complaints at this  time.  Objective   Vitals:  Vitals:   12/18/18 0447 12/18/18 0739  BP:  136/61  Pulse: 77 68  Resp:  17  Temp:  98.3 F (36.8 C)  SpO2: 100% 99%    Exam: Constitutional.  Appears calm, comfortable.  Mildly confused. Respiratory.  Clear to auscultation bilaterally.  No wheezes, rales or rhonchi.  Normal respiratory effort. Cardiovascular.  Regular rate and rhythm.  No murmur, rub or gallop.  No lower extremity edema. Psychiatric.  Confused, keeps asking if she is in the correct room.  She is not aware of where she is. Musculoskeletal.  Left lower leg appears unremarkable.  I have personally reviewed the following:   Today's Data  . CBG stable . Metabolic panel unremarkable . Hemoglobin stable 11.2. . Platelets stable 158. Marland Kitchen SARS Covid PCR pending; point-of-care was negative.  Scheduled Meds: . [MAR Hold] acetaminophen  1,000 mg Oral Q8H  . [MAR Hold] aspirin EC  325 mg Oral Daily  . [MAR Hold] calcium-vitamin D  2 tablet Oral Q breakfast  . [MAR Hold] carvedilol  6.25 mg Oral BID WC  . [MAR Hold] donepezil  10 mg Oral QHS  . [MAR Hold] insulin aspart  0-9 Units Subcutaneous TID WC  . [MAR Hold] pravastatin  80 mg Oral q1800  . [MAR Hold] QUEtiapine  25 mg Oral BH-q7a  . [MAR Hold] QUEtiapine  50 mg Oral QHS   Continuous Infusions:   Principal Problem:   Left displaced femoral neck fracture (HCC) Active Problems:   Stroke (cerebrum) (HCC)   Diabetes mellitus without complication ([place])   Fall   Fall at home, initial encounter   Dementia with behavioral disturbance ([place])   LOS:  0 days   How to contact the [facility] Attending or Consulting provider 7A - 7P or covering provider during after hours 7P -7A, for this patient?  1. Check the care team in [facility] and look for a) attending/consulting [facility] provider listed and b) the [facility] team listed 2. Log into [email or web address] and use [place]'s universal password to access. If you do not have the password, please contact the hospital  operator. 3. Locate the [facility] provider you are looking for under [facility] and page to a number that you can be directly reached. 4. If you still have difficulty reaching the provider, please page the [facility] (Director on Call) for the Hospitalists listed on amion for assistance.

## 2018-12-18 NOTE — Anesthesia Procedure Notes (Signed)
Procedure Name: MAC Performed by: Nola Botkins, [place], CRNA Pre-anesthesia Checklist: Patient identified, Emergency Drugs available, Suction available, Patient being monitored and Timeout performed Oxygen Delivery Method: Simple face mask       

## 2018-12-18 NOTE — Transfer of Care (Signed)
Immediate Anesthesia Transfer of Care Note  Patient: Stephanie Graham  Procedure(s) Performed: ARTHROPLASTY BIPOLAR HIP (HEMIARTHROPLASTY) (Left Hip)  Patient Location: PACU  Anesthesia Type:Spinal  Level of Consciousness: sedated  Airway & Oxygen Therapy: Patient Spontanous Breathing and Patient connected to face mask oxygen  Post-op Assessment: Report given to RN and Post -op Vital signs reviewed and stable  Post vital signs: Reviewed and stable  Last Vitals:  Vitals Value Taken Time  BP 125/64 12/18/18 1254  Temp    Pulse 55 12/18/18 1254  Resp 14 12/18/18 1254  SpO2 93 % 12/18/18 1254  Vitals shown include unvalidated device data.  Last Pain:  Vitals:   12/18/18 0944  TempSrc:   PainSc: 0-No pain         Complications: No apparent anesthesia complications

## 2018-12-18 NOTE — Anesthesia Post-op Follow-up Note (Signed)
Anesthesia QCDR form completed.        

## 2018-12-18 NOTE — Consult Note (Signed)
ORTHOPAEDIC CONSULTATION  REQUESTING PHYSICIAN: Standley Brooking, MD  Chief Complaint: Left hip pain status post fall  HPI: Stephanie Graham is a 85 y.o. female who was admitted to the hospital service overnight by the hospitalist service after arriving at the emergency department after an unwitnessed fall.  The patient has dementia and is unable to provide an accurate history.  She complains of pain in the left lower extremity.  Past Medical History:  Diagnosis Date  . 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 Diabetes mellitus without complication (HCC)   . Hypertension    Past Surgical History:  Procedure Laterality Date  . REPLACEMENT TOTAL KNEE Left    Social History   Socioeconomic History  . Marital status: Widowed    Spouse name: Not on file  . Number of children: Not on file  . Years of education: Not on file  . Highest education level: Not on file  Occupational History  . Not on file  Social Needs  . Financial resource strain: Not on file  . Food insecurity    Worry: Not on file    Inability: Not on file  . Transportation needs    Medical: Not on file    Non-medical: Not on file  Tobacco Use  . Smoking status: Never Smoker  . Smokeless tobacco: Former Engineer, water and Sexual Activity  . Alcohol use: No  . Drug use: No  . Sexual activity: Not on file  Lifestyle  . Physical activity    Days per week: Not on file    Minutes per session: Not on file  . Stress: Not on file  Relationships  . Social Musician on phone: Not on file    Gets together: Not on file    Attends religious service: Not on file    Active member of club or organization: Not on file    Attends meetings of clubs or organizations: Not on file    Relationship status: Not on file  Other Topics Concern  . Not on file  Social History Narrative  . Not on file   History reviewed. No pertinent family history. Allergies  Allergen Reactions  .  Hydrochlorothiazide W-Triamterene     Other reaction(s): Unknown  . Simvastatin     Other reaction(s): Unknown  . Verapamil     Other reaction(s): Unknown   Prior to Admission medications   Medication Sig Start Date End Date Taking? Authorizing Provider  acetaminophen (TYLENOL) 650 MG CR tablet Take 1,300 mg by mouth every 8 (eight) hours as needed for pain.   Yes [provider]  aspirin 325 MG tablet Take 1 tablet (325 mg total) by mouth daily. 01/20/17  Yes Altamese Dilling, MD  carvedilol (COREG) 12.5 MG tablet Take 6.25 mg by mouth 2 (two) times daily.    Yes [provider]  donepezil (ARICEPT) 10 MG tablet Take 10 mg by mouth at bedtime. 02/06/16  Yes [provider]  loratadine (CLARITIN) 10 MG tablet Take 10 mg by mouth daily.   Yes [provider]  losartan (COZAAR) 25 MG tablet Take 25 mg by mouth daily.   Yes [provider]  metFORMIN (GLUCOPHAGE) 500 MG tablet Take 1 tablet (500 mg total) by mouth 2 (two) times daily. 02/24/18  Yes Mayo, Allyn Kenner, MD  pravastatin (PRAVACHOL) 40 MG tablet Take 80 mg by mouth at bedtime. 01/04/16  Yes [provider]  QUEtiapine (SEROQUEL)  50 MG tablet Take 1 tablet (50 mg total) by mouth at bedtime. 10/18/18  Yes Loleta RoseForbach, Cory, MD  sertraline (ZOLOFT) 25 MG tablet Take 25 mg by mouth 2 (two) times daily. Taking 25 mg in the morning and 25 mg (with 50 mg) at bedtime. 12/14/18  Yes [provider]  traZODone (DESYREL) 50 MG tablet Take 50 mg by mouth at bedtime as needed for sleep.   Yes [provider]  meloxicam (MOBIC) 7.5 MG tablet Take 7.5-15 mg by mouth daily as needed for pain.    [provider]  divalproex (DEPAKOTE SPRINKLE) 125 MG capsule Take 125-250 mg by mouth 2 (two) times daily.  10/07/18 10/18/18  [provider]  mirtazapine (REMERON) 7.5 MG tablet Take 7.5 mg by mouth at bedtime.  10/14/18 10/18/18  [provider]   Dg Chest 1  View  Result Date: 12/17/2018 CLINICAL DATA:  Preop left hip fracture.  Fall. EXAM: CHEST  1 VIEW COMPARISON:  02/21/2018 FINDINGS: Cardiomegaly. Lungs clear. No effusions. No acute bony abnormality. IMPRESSION: Cardiomegaly.  No active disease. Electronically Signed   By: Charlett NoseKevin  Dover M.D.   On: 12/17/2018 22:19   Ct Head Wo Contrast  Result Date: 12/17/2018 CLINICAL DATA:  Fall EXAM: CT HEAD WITHOUT CONTRAST TECHNIQUE: Contiguous axial images were obtained from the base of the skull through the vertex without intravenous contrast. COMPARISON:  02/21/2018 FINDINGS: Brain: There is atrophy and chronic small vessel disease changes. No acute intracranial abnormality. Specifically, no hemorrhage, hydrocephalus, mass lesion, acute infarction, or significant intracranial injury. Vascular: No hyperdense vessel or unexpected calcification. Skull: No acute calvarial abnormality. Sinuses/Orbits: Opacified left maxillary sinus and scattered ethmoid air cells. Mucosal thickening in the right maxillary sinus. Other: None IMPRESSION: Atrophy, chronic microvascular disease. No acute intracranial abnormality. Chronic sinusitis. Electronically Signed   By: Charlett NoseKevin  Dover M.D.   On: 12/17/2018 22:21   Dg Hip Unilat With Pelvis 2-3 Views Left  Result Date: 12/17/2018 CLINICAL DATA:  Fall.  Left hip pain. EXAM: DG HIP (WITH OR WITHOUT PELVIS) 2-3V LEFT COMPARISON:  None. FINDINGS: There is a left femoral neck fracture with mild impaction and varus angulation. No subluxation or dislocation. IMPRESSION: Left femoral neck fracture with impaction and varus angulation. Electronically Signed   By: Charlett NoseKevin  Dover M.D.   On: 12/17/2018 22:19    Positive ROS: All other systems have been reviewed and were otherwise negative with the exception of those mentioned in the HPI and as above.  Physical Exam: General: Alert, no acute distress Cardiovascular: No pedal edema Respiratory: No cyanosis, no use of accessory musculature GI:  No organomegaly, abdomen is soft and non-tender Skin: No lesions in the area of chief complaint Neurologic: Sensation intact distally Psychiatric: Patient is competent for consent with normal mood and affect Lymphatic: No axillary or cervical lymphadenopathy  MUSCULOSKELETAL: Left lower extremity: Patient is lying in bed on her left side.  She has an external rotation deformity.  Given her position in bed is difficult to determine leg length discrepancy.  Patient has palpable pedal pulses in both lower extremities as well as intact sensation to light touch and intact motor function.  The patient is able to follow basic commands.  Assessment: Displaced left femoral neck hip fracture  Plan: Given the patient's dementia, I contacted the patient's daughter who was also with the patient's granddaughter.  I explained to them the nature of the fracture.  I recommended a left hip hemiarthroplasty for treatment.  They explained that the  patient was ambulating at baseline.  I explained to them that the patient would not be able to bear weight on the left lower extremity without performing the left hip hemiarthroplasty.  I described the details of the operation as well as the postoperative course to them.  I discussed the risks and benefits of surgery with him as well. They understand the risks include but are not limited to infection, bleeding requiring blood transfusion, nerve or blood vessel injury, joint stiffness or loss of motion, persistent pain, weakness or instability, hip dislocation, fracture and hardware failure and the need for further surgery. Medical risks include but are not limited to DVT and pulmonary embolism, myocardial infarction, stroke, pneumonia, respiratory failure and death.  The patient's family understood these risks and wished to proceed.   Patient has been cleared by the hospitalist service for surgery.  She is obviously at high risk for surgery given her age but she does not have  significant medical core morbidities contributing to higher risk other than her history of stroke.  I reviewed the patient's laboratory studies and radiographs in preparation for this case.   Thornton Park, MD    12/18/2018 9:57 AM

## 2018-12-19 DIAGNOSIS — E43 Unspecified severe protein-calorie malnutrition: Secondary | ICD-10-CM | POA: Insufficient documentation

## 2018-12-19 DIAGNOSIS — D696 Thrombocytopenia, unspecified: Secondary | ICD-10-CM

## 2018-12-19 DIAGNOSIS — D62 Acute posthemorrhagic anemia: Secondary | ICD-10-CM

## 2018-12-19 LAB — CBC
HCT: 28.8 % — ABNORMAL LOW (ref 36.0–46.0)
Hemoglobin: 9.2 g/dL — ABNORMAL LOW (ref 12.0–15.0)
MCH: 30.8 pg (ref 26.0–34.0)
MCHC: 31.9 g/dL (ref 30.0–36.0)
MCV: 96.3 fL (ref 80.0–100.0)
Platelets: 129 10*3/uL — ABNORMAL LOW (ref 150–400)
RBC: 2.99 MIL/uL — ABNORMAL LOW (ref 3.87–5.11)
RDW: 13.2 % (ref 11.5–15.5)
WBC: 7.5 10*3/uL (ref 4.0–10.5)
nRBC: 0 % (ref 0.0–0.2)

## 2018-12-19 LAB — COMPREHENSIVE METABOLIC PANEL
ALT: 11 U/L (ref 0–44)
AST: 19 U/L (ref 15–41)
Albumin: 2.9 g/dL — ABNORMAL LOW (ref 3.5–5.0)
Alkaline Phosphatase: 50 U/L (ref 38–126)
Anion gap: 10 (ref 5–15)
BUN: 18 mg/dL (ref 8–23)
CO2: 25 mmol/L (ref 22–32)
Calcium: 8.4 mg/dL — ABNORMAL LOW (ref 8.9–10.3)
Chloride: 104 mmol/L (ref 98–111)
Creatinine, Ser: 1.02 mg/dL — ABNORMAL HIGH (ref 0.44–1.00)
GFR calc Af Amer: 54 mL/min — ABNORMAL LOW (ref >60)
GFR calc non Af Amer: 47 mL/min — ABNORMAL LOW (ref >60)
Glucose, Bld: 158 mg/dL — ABNORMAL HIGH (ref 70–99)
Potassium: 3.7 mmol/L (ref 3.5–5.1)
Sodium: 139 mmol/L (ref 135–145)
Total Bilirubin: 0.7 mg/dL (ref 0.3–1.2)
Total Protein: 5.7 g/dL — ABNORMAL LOW (ref 6.5–8.1)

## 2018-12-19 LAB — GLUCOSE, CAPILLARY
Glucose-Capillary: 156 mg/dL — ABNORMAL HIGH (ref 70–99)
Glucose-Capillary: 134 mg/dL — ABNORMAL HIGH (ref 70–99)
Glucose-Capillary: 131 mg/dL — ABNORMAL HIGH (ref 70–99)

## 2018-12-19 MED ORDER — ENOXAPARIN SODIUM 30 MG/0.3ML ~~LOC~~ SOLN
30.0000 mg | SUBCUTANEOUS | Status: DC
  Administered 2018-12-19 – 2018-12-21 (×3): 30 mg via SUBCUTANEOUS
  Filled 2018-12-19 (×3): qty 0.3

## 2018-12-19 MED ORDER — SODIUM CHLORIDE 0.9 % IV SOLN: INTRAVENOUS | Status: DC

## 2018-12-19 MED ORDER — GLUCERNA SHAKE PO LIQD
237.0000 mL | Freq: Three times a day (TID) | ORAL | Status: DC
  Administered 2018-12-19 – 2018-12-21 (×7): 237 mL via ORAL

## 2018-12-19 MED ORDER — SODIUM CHLORIDE 0.9 % IV SOLN
INTRAVENOUS | Status: DC
  Administered 2018-12-19 – 2018-12-21 (×4): via INTRAVENOUS

## 2018-12-19 MED ORDER — ADULT MULTIVITAMIN W/MINERALS CH
1.0000 | ORAL_TABLET | Freq: Every day | ORAL | Status: DC
  Administered 2018-12-20 – 2018-12-21 (×2): 1 via ORAL
  Filled 2018-12-19 (×2): qty 1

## 2018-12-19 NOTE — Progress Notes (Signed)
PROGRESS NOTE  Stephanie Graham BPZ:[id number] DOB: [birth year] DOA: 12/17/2018 PCP: Dion Body, MD  Brief History   85 year old woman from home (lives with daughter) PMH diabetes mellitus type 2, dementia, right eye blindness presented after an unwitnessed mechanical fall resulting in left hip pain.  Per report patient was hallucinating (this happens intermittently) when granddaughter heard a noise and found her on the floor.  Imaging revealed left femoral neck fracture and the patient was admitted for definitive management per orthopedics.  A & P  Left femoral neck fracture status post fall at home.  As per H&P, patient cleared for surgery with no further testing recommended. --Status post left hip hemiarthroplasty 11/28 --Weight-bear as tolerated left lower extremity, PT and OT evaluations.  Posterior hip precautions. --Enoxaparin for DVT prophylaxis. --Pain medication as needed.  Stop ketorolac.  Acute blood loss anemia --Likely perioperative in nature.  Asymptomatic.  Check CBC in a.m.  Thrombocytopenia --Likely related to blood loss.  Check CBC in a.m.  Mild increase in creatinine.  Possibly related to ketorolac.   --Stop ketorolac --IV fluids.  BMP in a.m.  Diabetes mellitus type 2 --Hold Metformin --CBG stable.  Continue sliding scale insulin.  Essential hypertension --Stable.  Continue carvedilol.  Dementia with behavioral disturbance (hallucinations), depression --Appears stable.  Continue Aricept, quetiapine, Depakote, Remeron  PMH: Stroke, left bundle branch block --Continue Pravachol, aspirin  . Asymptomatic postoperatively.  Dementia does limit history.  Mild blood loss anemia and thrombocytopenia.  Will check CBC in a.m.  Up with therapy.  May need SNF.  DVT prophylaxis: Per orthopedics Code Status: Full code Family Communication:  Disposition Plan: pending therapy evaluations post-op    Murray Hodgkins, MD  [facility] Direct contact:  see [email or web address] (further directions at bottom of note if needed) 7PM-7AM contact night coverage as at bottom of note 12/19/2018, 10:19 AM  LOS: 1 day   Significant Hospital Events   . 11/27 admitted for left hip fracture   Consults:  . Orthopedics   Procedures:  . 11/28 left hip hemiarthroplasty  Significant Diagnostic Tests:  . 11/27 hip film left femoral neck fracture, chest x-ray no acute disease, CT head no acute disease, EKG SR, LBBB, no acute changes (10/17/2018)   Micro Data:  .    Antimicrobials:  .   Interval History/Subjective  Confused but appears to feel okay.  Tech at bedside assisting with administration of breakfast.  Objective   Vitals:  Vitals:   12/19/18 0505 12/19/18 0749  BP: (!) 105/55 (!) 106/55  Pulse: 71 78  Resp: 16 17  Temp: 99 F (37.2 C) 97.9 F (36.6 C)  SpO2: 97% 94%    Exam: Constitutional.  Appears calm, comfortable. Respiratory.  Clear to auscultation bilaterally.  No wheezes, rales or rhonchi.  Normal respiratory effort. Cardiovascular.  Regular rate and rhythm.  No murmur, rub or gallop.  No lower extremity edema. Musculoskeletal.  Moves both legs to command. Psychiatric.  Pleasantly confused.  I have personally reviewed the following:   Today's Data  . Creatinine increased to 1.02.  BUN stable at 18.  Remainder CMP unremarkable. . Hemoglobin significant drop 9.2.  Platelets down to 129.  Probably reflective of blood loss related to surgery.  Scheduled Meds: . acetaminophen  1,000 mg Oral Q8H  . aspirin EC  325 mg Oral Daily  . calcium-vitamin D  2 tablet Oral Q breakfast  . carvedilol  6.25 mg Oral BID WC  . docusate sodium  100 mg Oral BID  .  donepezil  10 mg Oral QHS  . enoxaparin (LOVENOX) injection  30 mg Subcutaneous Q24H  . insulin aspart  0-9 Units Subcutaneous TID WC  . loratadine  10 mg Oral Daily  . losartan  25 mg Oral Daily  . pravastatin  80 mg Oral q1800  . QUEtiapine  25 mg Oral BH-q7a  . QUEtiapine  50  mg Oral QHS  . senna  1 tablet Oral BID  . sertraline  25 mg Oral BID  . traMADol  50 mg Oral Q6H   Continuous Infusions: . sodium chloride    . methocarbamol (ROBAXIN) IV      Principal Problem:   Left displaced femoral neck fracture (HCC) Active Problems:   Stroke (cerebrum) (HCC)   Diabetes mellitus without complication (HCC)   Fall at home, initial encounter   Dementia with behavioral disturbance (HCC)   Acute blood loss anemia   Thrombocytopenia (HCC)   LOS: 1 day   How to contact the [facility] Attending or Consulting provider 7A - 7P or covering provider during after hours 7P -7A, for this patient?  1. Check the care team in [facility] and look for a) attending/consulting [facility] provider listed and b) the [facility] team listed 2. Log into [email or web address] and use [place]'s universal password to access. If you do not have the password, please contact the hospital operator. 3. Locate the [facility] provider you are looking for under [facility] and page to a number that you can be directly reached. 4. If you still have difficulty reaching the provider, please page the [facility] (Director on Call) for the Hospitalists listed on amion for assistance.

## 2018-12-19 NOTE — Anesthesia Postprocedure Evaluation (Signed)
Anesthesia Post Note  Patient: Stephanie Graham  Procedure(Graham) Performed: ARTHROPLASTY BIPOLAR HIP (HEMIARTHROPLASTY) (Left Hip)  Patient location during evaluation: PACU Anesthesia Type: Spinal Level of consciousness: oriented and awake and alert Pain management: pain level controlled Vital Signs Assessment: post-procedure vital signs reviewed and stable Respiratory status: spontaneous breathing, respiratory function stable and patient connected to nasal cannula oxygen Cardiovascular status: blood pressure returned to baseline and stable Postop Assessment: no headache, no backache and no apparent nausea or vomiting Anesthetic complications: no     Last Vitals:  Vitals:   12/19/18 0505 12/19/18 0749  BP: (!) 105/55 (!) 106/55  Pulse: 71 78  Resp: 16 17  Temp: 37.2 C 36.6 C  SpO2: 97% 94%    Last Pain:  Vitals:   12/19/18 0749  TempSrc: Oral  PainSc:                  Stephanie Graham

## 2018-12-19 NOTE — Progress Notes (Signed)
PT Cancellation Note  Patient Details Name: [name] MRN: [id number] DOB: [birth year]   Cancelled Treatment:    Reason Eval/Treat Not Completed: Patient declined, no reason specified;Fatigue/lethargy limiting ability to participate;Patient's level of consciousness. Pt extremely fatigued and lethargic upon arrival. Pt woke to state her name but could not stay awake to actively participate despite ~8 min attempting to wake up. Pt stated no multiple times when asked to participate with PT. PT will follow up as able and pt appropriate and more able to participate.   Zachary George PT, DPT 12:06 PM,12/19/18 [phone]   Johney Frame 12/19/2018, 12:04 PM

## 2018-12-19 NOTE — Progress Notes (Signed)
Pts BP 106/55, MD notified, per MD ok to give scheduled Coreg.

## 2018-12-19 NOTE — Progress Notes (Signed)
pts BP 94/53, MD made aware. No orders received.

## 2018-12-19 NOTE — Progress Notes (Signed)
Initial Nutrition Assessment  DOCUMENTATION CODES:   Severe malnutrition in context of social or environmental circumstances  INTERVENTION:  Provide Glucerna Shake po TID, each supplement provides 220 kcal and 10 grams of protein. Patient prefers chocolate.  Provide daily MVI.  Discussed nutrition recommendations with patient's daughter. Encouraged a good source of protein at each meal. Discussed other sources of protein since patient does not eat as much meat anymore.  NUTRITION DIAGNOSIS:   Severe Malnutrition related to social / environmental circumstances(dementia, advanced age) as evidenced by severe fat depletion, severe muscle depletion.  GOAL:   Patient will meet greater than or equal to 90% of their needs  MONITOR:   PO intake, Supplement acceptance, Labs, Weight trends, Skin, I & O's  REASON FOR ASSESSMENT:   Malnutrition Screening Tool, Consult Diet education  ASSESSMENT:   85 year old female with PMHx of dementia, DM, HTN admitted after an unwitnessed mechanical fall found to have left femoral neck fracture s/p left hip hemiarthroplasty 11/28.   Met with patient at bedside. She was sleeping when RD entered room but woke up to name call. She was unable to provide any history. Lunch tray at bedside she had eaten about 50% of her potato and some bites of meat. Called patient's daughter over the phone. She reports patient has not been eating well the past 1-2 months. Patient receives Meals on Wheels but does not like how the food is seasoned and also has difficulty with eating meat. Family has been focusing on high-calorie foods. Patient also drinks 3 bottles of Glucerna daily and enjoys the chocolate flavor.   Daughter reports patient has lost about 5 lbs over the past 3-4 months. If current weight is accurate that would be a 4% weight loss, which would not be significant for time frame.  Medications reviewed and include: Oscal with D 2 tablets daily. Colace 100 mg BID,  Novolog 0-9 units TID, Seroquel, senna 1 tablet BID, sertraline.  Labs reviewed: CBG 134-156, Creatinine 1.02.  NUTRITION - FOCUSED PHYSICAL EXAM:    Most Recent Value  Orbital Region  Severe depletion  Upper Arm Region  Severe depletion  Thoracic and Lumbar Region  Unable to assess  Buccal Region  Severe depletion  Temple Region  Severe depletion  Clavicle Bone Region  Severe depletion  Clavicle and Acromion Bone Region  Severe depletion  Scapular Bone Region  Unable to assess  Dorsal Hand  Severe depletion  Patellar Region  Unable to assess  Anterior Thigh Region  Unable to assess  Posterior Calf Region  Unable to assess  Edema (RD Assessment)  Unable to assess  Hair  Reviewed  Eyes  Reviewed  Mouth  Unable to assess  Skin  Reviewed  Nails  Reviewed     Diet Order:   Diet Order            Diet regular Room service appropriate? Yes; Fluid consistency: Thin  Diet effective now             EDUCATION NEEDS:   Education needs have been addressed  Skin:  Skin Assessment: Skin Integrity Issues:(closed incision left hip)  Last BM:  Unknown  Height:   Ht Readings from Last 1 Encounters:  12/17/18 5' 2" (1.575 m)   Weight:   Wt Readings from Last 1 Encounters:  12/17/18 54.4 kg   Ideal Body Weight:  50 kg  BMI:  Body mass index is 21.95 kg/m.  Estimated Nutritional Needs:   Kcal:  4696-2952  Protein:  68-78 grams  Fluid:  1.3-1.5 L/day  Jacklynn Barnacle, MS, RD, LDN Office: [phone] Pager: [phone] After Hours/Weekend Pager: [phone]

## 2018-12-19 NOTE — Progress Notes (Signed)
Subjective:  POD #1 s/p left hip hemiarthroplasty.   Patient has dementia and is unable to provide an accurate history.  She is drowsy but arousable.  She appears to be in no pain.    Objective:   VITALS:   Vitals:   12/18/18 2008 12/18/18 2359 12/19/18 0505 12/19/18 0749  BP: 138/73 (!) 100/59 (!) 105/55 (!) 106/55  Pulse: 79 82 71 78  Resp: 17 16 16 17   Temp: 98 F (36.7 C) 98.4 F (36.9 C) 99 F (37.2 C) 97.9 F (36.6 C)  TempSrc: Oral   Oral  SpO2: 97% 93% 97% 94%  Weight:      Height:        PHYSICAL EXAM: Left lower extremity Neurovascular intact Sensation intact distally Intact pulses distally Dorsiflexion/Plantar flexion intact Incision: moderate drainage No cellulitis present Compartment soft  LABS  Results for orders placed or performed during the hospital encounter of 12/17/18 (from the past 24 hour(s))  Glucose, capillary     Status: None   Collection Time: 12/18/18  1:09 PM  Result Value Ref Range   Glucose-Capillary 80 70 - 99 mg/dL  Glucose, capillary     Status: Abnormal   Collection Time: 12/18/18  4:29 PM  Result Value Ref Range   Glucose-Capillary 128 (H) 70 - 99 mg/dL  Glucose, capillary     Status: Abnormal   Collection Time: 12/18/18  4:56 PM  Result Value Ref Range   Glucose-Capillary 142 (H) 70 - 99 mg/dL   Comment 1 Notify RN   Glucose, capillary     Status: Abnormal   Collection Time: 12/18/18  9:28 PM  Result Value Ref Range   Glucose-Capillary 137 (H) 70 - 99 mg/dL   Comment 1 Notify RN   Comprehensive metabolic panel     Status: Abnormal   Collection Time: 12/19/18  4:34 AM  Result Value Ref Range   Sodium 139 135 - 145 mmol/L   Potassium 3.7 3.5 - 5.1 mmol/L   Chloride 104 98 - 111 mmol/L   CO2 25 22 - 32 mmol/L   Glucose, Bld 158 (H) 70 - 99 mg/dL   BUN 18 8 - 23 mg/dL   Creatinine, Ser 12/21/18 (H) 0.44 - 1.00 mg/dL   Calcium 8.4 (L) 8.9 - 10.3 mg/dL   Total Protein 5.7 (L) 6.5 - 8.1 g/dL   Albumin 2.9 (L) 3.5 - 5.0 g/dL    AST 19 15 - 41 U/L   ALT 11 0 - 44 U/L   Alkaline Phosphatase 50 38 - 126 U/L   Total Bilirubin 0.7 0.3 - 1.2 mg/dL   GFR calc non Af Amer 47 (L) >60 mL/min   GFR calc Af Amer 54 (L) >60 mL/min   Anion gap 10 5 - 15  CBC     Status: Abnormal   Collection Time: 12/19/18  4:34 AM  Result Value Ref Range   WBC 7.5 4.0 - 10.5 K/uL   RBC 2.99 (L) 3.87 - 5.11 MIL/uL   Hemoglobin 9.2 (L) 12.0 - 15.0 g/dL   HCT 12/21/18 (L) 79.0 - 24.0 %   MCV 96.3 80.0 - 100.0 fL   MCH 30.8 26.0 - 34.0 pg   MCHC 31.9 30.0 - 36.0 g/dL   RDW 97.3 53.2 - 99.2 %   Platelets 129 (L) 150 - 400 K/uL   nRBC 0.0 0.0 - 0.2 %  Glucose, capillary     Status: Abnormal   Collection Time: 12/19/18  7:46 AM  Result Value Ref Range   Glucose-Capillary 156 (H) 70 - 99 mg/dL   Comment 1 Notify RN     Dg Chest 1 View  Result Date: 12/17/2018 CLINICAL DATA:  Preop left hip fracture.  Fall. EXAM: CHEST  1 VIEW COMPARISON:  02/21/2018 FINDINGS: Cardiomegaly. Lungs clear. No effusions. No acute bony abnormality. IMPRESSION: Cardiomegaly.  No active disease. Electronically Signed   By: Rolm Baptise M.D.   On: 12/17/2018 22:19   Ct Head Wo Contrast  Result Date: 12/17/2018 CLINICAL DATA:  Fall EXAM: CT HEAD WITHOUT CONTRAST TECHNIQUE: Contiguous axial images were obtained from the base of the skull through the vertex without intravenous contrast. COMPARISON:  02/21/2018 FINDINGS: Brain: There is atrophy and chronic small vessel disease changes. No acute intracranial abnormality. Specifically, no hemorrhage, hydrocephalus, mass lesion, acute infarction, or significant intracranial injury. Vascular: No hyperdense vessel or unexpected calcification. Skull: No acute calvarial abnormality. Sinuses/Orbits: Opacified left maxillary sinus and scattered ethmoid air cells. Mucosal thickening in the right maxillary sinus. Other: None IMPRESSION: Atrophy, chronic microvascular disease. No acute intracranial abnormality. Chronic sinusitis.  Electronically Signed   By: Rolm Baptise M.D.   On: 12/17/2018 22:21   Dg Hip Port Unilat With Pelvis 1v Left  Result Date: 12/18/2018 CLINICAL DATA:  The patient suffered a left subcapital fracture in a fall 12/17/2018. Status post left hip replacement today. Initial encounter. EXAM: DG HIP (WITH OR WITHOUT PELVIS) 1V PORT LEFT COMPARISON:  Plain films left hip 12/17/2018. FINDINGS: A new bipolar left hip hemiarthroplasty is in place. The device is located. No fracture. Gas in the soft tissues and surgical staples noted. IMPRESSION: Status post left hip replacement.  No acute finding. Electronically Signed   By: Inge Rise M.D.   On: 12/18/2018 14:13   Dg Hip Unilat With Pelvis 2-3 Views Left  Result Date: 12/17/2018 CLINICAL DATA:  Fall.  Left hip pain. EXAM: DG HIP (WITH OR WITHOUT PELVIS) 2-3V LEFT COMPARISON:  None. FINDINGS: There is a left femoral neck fracture with mild impaction and varus angulation. No subluxation or dislocation. IMPRESSION: Left femoral neck fracture with impaction and varus angulation. Electronically Signed   By: Rolm Baptise M.D.   On: 12/17/2018 22:19    Assessment/Plan: 1 Day Post-Op   Principal Problem:   Left displaced femoral neck fracture (HCC) Active Problems:   Stroke (cerebrum) (HCC)   Diabetes mellitus without complication ([place])   Fall   Fall at home, initial encounter   Dementia with behavioral disturbance [facility])  Patient is stable postop.  Hemoglobin and hematocrit are within acceptable levels.  Physical therapy/occupational therapy as tolerated.  Patient is weightbearing as tolerated left lower extremity.  Patient should observe posterior hip precautions.  Patient should use abduction pillow while lying in bed.    Thornton Park , MD 12/19/2018, 8:53 AM

## 2018-12-19 NOTE — Progress Notes (Signed)
Pt has not voided since foley was removed early this morning.  Bladder scan 50 ml. MD notified. No orders received.

## 2018-12-19 NOTE — Progress Notes (Signed)
Pts BP 103/61, MD notified, per MD hold evening dose of Coreg.

## 2018-12-20 ENCOUNTER — Encounter: Payer: Self-pay | Admitting: Orthopedic Surgery

## 2018-12-20 LAB — CBC
HCT: 27.9 % — ABNORMAL LOW (ref 36.0–46.0)
Hemoglobin: 8.6 g/dL — ABNORMAL LOW (ref 12.0–15.0)
MCH: 30 pg (ref 26.0–34.0)
MCHC: 30.8 g/dL (ref 30.0–36.0)
MCV: 97.2 fL (ref 80.0–100.0)
Platelets: 121 10*3/uL — ABNORMAL LOW (ref 150–400)
RBC: 2.87 MIL/uL — ABNORMAL LOW (ref 3.87–5.11)
RDW: 13.4 % (ref 11.5–15.5)
WBC: 7.3 10*3/uL (ref 4.0–10.5)
nRBC: 0 % (ref 0.0–0.2)

## 2018-12-20 LAB — COMPREHENSIVE METABOLIC PANEL
ALT: 10 U/L (ref 0–44)
AST: 23 U/L (ref 15–41)
Albumin: 2.9 g/dL — ABNORMAL LOW (ref 3.5–5.0)
Alkaline Phosphatase: 54 U/L (ref 38–126)
Anion gap: 11 (ref 5–15)
BUN: 20 mg/dL (ref 8–23)
CO2: 21 mmol/L — ABNORMAL LOW (ref 22–32)
Calcium: 8.2 mg/dL — ABNORMAL LOW (ref 8.9–10.3)
Chloride: 111 mmol/L (ref 98–111)
Creatinine, Ser: 0.92 mg/dL (ref 0.44–1.00)
GFR calc Af Amer: >60 mL/min (ref >60)
GFR calc non Af Amer: 53 mL/min — ABNORMAL LOW (ref >60)
Glucose, Bld: 129 mg/dL — ABNORMAL HIGH (ref 70–99)
Potassium: 3.4 mmol/L — ABNORMAL LOW (ref 3.5–5.1)
Sodium: 143 mmol/L (ref 135–145)
Total Bilirubin: 0.7 mg/dL (ref 0.3–1.2)
Total Protein: 5.8 g/dL — ABNORMAL LOW (ref 6.5–8.1)

## 2018-12-20 LAB — GLUCOSE, CAPILLARY
Glucose-Capillary: 104 mg/dL — ABNORMAL HIGH (ref 70–99)
Glucose-Capillary: 125 mg/dL — ABNORMAL HIGH (ref 70–99)
Glucose-Capillary: 118 mg/dL — ABNORMAL HIGH (ref 70–99)
Glucose-Capillary: 135 mg/dL — ABNORMAL HIGH (ref 70–99)
Glucose-Capillary: 125 mg/dL — ABNORMAL HIGH (ref 70–99)

## 2018-12-20 MED ORDER — POTASSIUM CHLORIDE CRYS ER 10 MEQ PO TBCR
EXTENDED_RELEASE_TABLET | ORAL | Status: AC
  Administered 2018-12-20: 20:00:00
  Filled 2018-12-20: qty 3

## 2018-12-20 MED ORDER — POTASSIUM CHLORIDE CRYS ER 20 MEQ PO TBCR
40.0000 meq | EXTENDED_RELEASE_TABLET | Freq: Once | ORAL | Status: AC
  Administered 2018-12-20: 40 meq via ORAL
  Filled 2018-12-20: qty 2

## 2018-12-20 MED ORDER — CHLORHEXIDINE GLUCONATE CLOTH 2 % EX PADS
6.0000 | MEDICATED_PAD | Freq: Every day | CUTANEOUS | Status: DC
  Administered 2018-12-20 – 2018-12-21 (×2): 6 via TOPICAL

## 2018-12-20 MED ORDER — MUPIROCIN 2 % EX OINT
TOPICAL_OINTMENT | Freq: Two times a day (BID) | CUTANEOUS | Status: DC
  Administered 2018-12-20 – 2018-12-21 (×3): via NASAL
  Filled 2018-12-20 (×2): qty 22

## 2018-12-20 MED ORDER — POTASSIUM CHLORIDE CRYS ER 20 MEQ PO TBCR
30.0000 meq | EXTENDED_RELEASE_TABLET | Freq: Once | ORAL | Status: AC
  Administered 2018-12-20: 30 meq via ORAL

## 2018-12-20 NOTE — Progress Notes (Signed)
Leta Bucklin will require a short term nursing home stay- 30 days or less.  Plan is for patient to return home

## 2018-12-20 NOTE — Progress Notes (Signed)
PROGRESS NOTE  Stephanie Graham TGY:[id number] DOB: 08/28/1921 DOA: 12/17/2018 PCP: Marisue Ivan, MD  Brief History   85 year old woman from home (lives with daughter) PMH diabetes mellitus type 2, dementia, right eye blindness presented after an unwitnessed mechanical fall resulting in left hip pain.  Per report patient was hallucinating (this happens intermittently) when granddaughter heard a noise and found her on the floor.  Imaging revealed left femoral neck fracture and the patient was admitted for definitive management per orthopedics.  A & P  Left femoral neck fracture status post fall at home.  As per H&P, patient cleared for surgery with no further testing recommended. Status post left hip hemiarthroplasty 11/28 --Continue weight-bear as tolerated left lower extremity, PT and OT evaluations.  Posterior hip precautions. --Continue enoxaparin for DVT prophylaxis. --Continue analgesia as needed  Acute blood loss anemia --Hemoglobin probably at nadir.  Check CBC in a.m.  Thrombocytopenia --Probably related to blood loss.  May be reaching nadir.  Check CBC in a.m.  Mild increase in creatinine.  Possibly related to ketorolac.   --BUN slightly higher at 20 but creatinine has improved back down to 1.92.  We will continue IV fluids at lower rate check BMP in a.m.  Diabetes mellitus type 2 --Continue to hold Metformin --CBG remains stable.  Continue sliding scale insulin.  Essential hypertension --Remains stable.  Carvedilol held at times for borderline low blood pressure.  Dementia with behavioral disturbance (hallucinations), depression --More confused today.  Bed alarm on.  Tramadol stopped.  Continue Aricept, quetiapine, Zoloft  PMH: Stroke, left bundle branch block --Continue Pravachol, aspirin  . Confused but otherwise seems to be stable.  Platelets and hemoglobin slightly lower but probably near nadir.  Renal function improved.  Continue fluids, check CBC in a.m.   Check SARS-CoV-2 today in anticipation of discharge to skilled nursing facility tomorrow for rehab.  DVT prophylaxis: Per orthopedics Code Status: Full code Family Communication: Discussed with daughter by telephone yesterday Disposition Plan: SNF   Brendia Sacks, MD  [facility] Direct contact: see [email or web address] (further directions at bottom of note if needed) 7PM-7AM contact night coverage as at bottom of note 12/20/2018, 1:53 PM  LOS: 2 days   Significant Hospital Events   . 11/27 admitted for left hip fracture   Consults:  . Orthopedics   Procedures:  . 11/28 left hip hemiarthroplasty  Significant Diagnostic Tests:  . 11/27 hip film left femoral neck fracture, chest x-ray no acute disease, CT head no acute disease, EKG SR, LBBB, no acute changes (10/17/2018)   Micro Data:  .    Antimicrobials:  .   Interval History/Subjective  Confused today.  Objective   Vitals:  Vitals:   12/19/18 2352 12/20/18 0820  BP: 106/64   Pulse: 85 86  Resp: 18 18  Temp: 98.7 F (37.1 C) 98.4 F (36.9 C)  SpO2: 98% 96%    Exam: Constitutional.  Appears mildly anxious, not uncomfortable. Cardiovascular.  Regular rate and rhythm.  No murmur, rub or gallop.  No lower extremity edema. Respiratory.  Clear to auscultation bilaterally.  No wheezes, rales or rhonchi.  Normal respiratory effort. Musculoskeletal.  Moves all extremities. Psychiatric.  Confused.  I have personally reviewed the following:   Today's Data  . Potassium 3.4, remainder CMP unremarkable. . Hemoglobin slightly lower at 8.6.  Platelets without significant change at 121.  Scheduled Meds: . acetaminophen  1,000 mg Oral Q8H  . aspirin EC  325 mg Oral Daily  . calcium-vitamin D  2  tablet Oral Q breakfast  . carvedilol  6.25 mg Oral BID WC  . Chlorhexidine Gluconate Cloth  6 each Topical Q0600  . docusate sodium  100 mg Oral BID  . donepezil  10 mg Oral QHS  . enoxaparin (LOVENOX) injection  30 mg  Subcutaneous Q24H  . feeding supplement (GLUCERNA SHAKE)  237 mL Oral TID BM  . insulin aspart  0-9 Units Subcutaneous TID WC  . loratadine  10 mg Oral Daily  . multivitamin with minerals  1 tablet Oral Daily  . mupirocin ointment   Nasal BID  . pravastatin  80 mg Oral q1800  . QUEtiapine  50 mg Oral QHS  . senna  1 tablet Oral BID  . sertraline  25 mg Oral BID  . traMADol  50 mg Oral Q6H   Continuous Infusions: . sodium chloride 125 mL/hr at 12/20/18 0943  . methocarbamol (ROBAXIN) IV      Principal Problem:   Left displaced femoral neck fracture (HCC) Active Problems:   Stroke (cerebrum) (HCC)   Diabetes mellitus without complication ([place])   Fall at home, initial encounter   Dementia with behavioral disturbance ([place])   Acute blood loss anemia   Thrombocytopenia (HCC)   Protein-calorie malnutrition, severe   LOS: 2 days   How to contact the [facility] Attending or Consulting provider [place] or covering provider during after hours [place], for this patient?  1. Check the care team in [facility] and look for a) attending/consulting TRH provider listed and b) the [facility] team listed 2. Log into [email or web address] and use 's universal password to access. If you do not have the password, please contact the hospital operator. 3. Locate the [facility] provider you are looking for under [facility] and page to a number that you can be directly reached. 4. If you still have difficulty reaching the provider, please page the [facility] (Director on Call) for the Hospitalists listed on amion for assistance.

## 2018-12-20 NOTE — Evaluation (Signed)
Physical Therapy Evaluation Patient Details Name: Stephanie Graham MRN: [id number] DOB: [birth year] Today's Date: 12/20/2018   History of Present Illness  Pt is a 85 y.o. female presenting to hospital s/p unwitnessed fall with pain in L hip and L knee.  Imaging showing L femoral neck fx and s/p L hip hemiarthroplasty 12/18/18.  PMH includes blind R eye, dementia, DM, htn, h/o stroke, L TKR.  Clinical Impression  Prior to hospital admission, per chart review pt was ambulatory and living with family.  Pt unable to state name or DOB and appearing confused in general during session (pt talkative once woken but pt's spoken words not always consistent with conversation).  Pt stating "yes" to sitting on edge of bed but did not initiate to assist so required 2 assist semi-supine to/from sit using bed sheets to assist.  Pt fluctuating with assist levels sitting edge of bed d/t posterior lean.  Pt did not initiate to stand when attempting with 2 assist and walker use.  Minimal pain like behavior noted during session's activities.  Pt occasionally following 1 step commands.  Pt would benefit from skilled PT to address noted impairments and functional limitations (see below for any additional details).  Upon hospital discharge, pt would benefit from STR.  Will monitor pt's progress and ability to participate in therapy for any changes to therapy frequency.    Follow Up Recommendations SNF    Equipment Recommendations  Rolling walker with 5" wheels;3in1 (PT);Wheelchair (measurements PT);Wheelchair cushion (measurements PT)    Recommendations for Other Services       Precautions / Restrictions Precautions Precautions: Fall;Posterior Hip Precaution Comments: ABduction pillow in bed Restrictions Weight Bearing Restrictions: Yes LLE Weight Bearing: Weight bearing as tolerated      Mobility  Bed Mobility Overal bed mobility: Needs Assistance Bed Mobility: Supine to Sit;Sit to Supine     Supine to  sit: +2 for physical assistance;HOB elevated Sit to supine: +2 for physical assistance;HOB elevated   General bed mobility comments: 2 assist semi-supine to/from sit using bed sheets (pt did not initiate to assist when cued although pt stating "yes" when therapist asked pt if she wanted to sit onto edge of bed)  Transfers Overall transfer level: Needs assistance Equipment used: Rolling walker (2 wheeled) Transfers: Sit to/from Stand           General transfer comment: vc's and tactile cues for UE/LE positioning and to maintain posterior hip precautions; pt did not initiate to assist when cued to stand  Ambulation/Gait             General Gait Details: unable to stand to assess  Stairs            Wheelchair Mobility    Modified Rankin (Stroke Patients Only)       Balance Overall balance assessment: Needs assistance Sitting-balance support: Bilateral upper extremity supported;Feet supported Sitting balance-Leahy Scale: Poor Sitting balance - Comments: pt initially requiring max assist for sitting balance progressing to mod assist to min assist to briefly CGA but then fluctuating between min to mod assist d/t posterior lean Postural control: Posterior lean                                   Pertinent Vitals/Pain Pain Assessment: Faces Pain Location: L hip with mobility Pain Descriptors / Indicators: Grimacing;Operative site guarding Pain Intervention(s): Limited activity within patient's tolerance;Monitored during session;Repositioned    Home Living Family/patient  expects to be discharged to:: Private residence Living Arrangements: Other relatives(per chart pt lives with her granddaughter)               Additional Comments: No family present to verify home environment and pt unable to verbalize home environment.    Prior Function           Comments: Per chart review pt was ambulatory and walks a lot at grocery store with buggy; also  h/o hallucinations (happens intermittently per chart review)     Hand Dominance        Extremity/Trunk Assessment   Upper Extremity Assessment Upper Extremity Assessment: Difficult to assess due to impaired cognition    Lower Extremity Assessment Lower Extremity Assessment: Difficult to assess due to impaired cognition    Cervical / Trunk Assessment Cervical / Trunk Assessment: Normal  Communication   Communication: HOH  Cognition Arousal/Alertness: (pt sleeping upon PT arrival but woken with vc's) Behavior During Therapy: Flat affect Overall Cognitive Status: No family/caregiver present to determine baseline cognitive functioning                                 General Comments: Pt did not answer any A&O questions (did not state name or DOB when asked)      General Comments      Exercises  Bed mobility/transfer training   Assessment/Plan    PT Assessment Patient needs continued PT services  PT Problem List Decreased strength;Decreased activity tolerance;Decreased balance;Decreased mobility;Decreased knowledge of use of DME;Decreased knowledge of precautions;Decreased skin integrity;Pain       PT Treatment Interventions DME instruction;Gait training;Stair training;Functional mobility training;Therapeutic activities;Therapeutic exercise;Balance training;Patient/family education    PT Goals (Current goals can be found in the Care Plan section)  Acute Rehab PT Goals Patient Stated Goal: pt did not state any goals PT Goal Formulation: Patient unable to participate in goal setting Time For Goal Achievement: 01/03/19 Potential to Achieve Goals: Fair    Frequency 7X/week   Barriers to discharge Decreased caregiver support      Co-evaluation               AM-PAC PT "6 Clicks" Mobility  Outcome Measure Help needed turning from your back to your side while in a flat bed without using bedrails?: A Lot Help needed moving from lying on your back to  sitting on the side of a flat bed without using bedrails?: Total Help needed moving to and from a bed to a chair (including a wheelchair)?: Total Help needed standing up from a chair using your arms (e.g., wheelchair or bedside chair)?: Total Help needed to walk in hospital room?: Total Help needed climbing 3-5 steps with a railing? : Total 6 Click Score: 7    End of Session Equipment Utilized During Treatment: Gait belt Activity Tolerance: Patient tolerated treatment well Patient left: in bed;with call bell/phone within reach;with bed alarm set;with SCD's reapplied;Other (comment)(hip ABductor wedge in place; bed in lowest position; B heels floating via towel rolls) Nurse Communication: Mobility status;Precautions;Weight bearing status;Other (comment)(via white board) PT Visit Diagnosis: Other abnormalities of gait and mobility (R26.89);Muscle weakness (generalized) (M62.81);History of falling (Z91.81);Difficulty in walking, not elsewhere classified (R26.2);Pain Pain - Right/Left: Left Pain - part of body: Hip    Time: [id number] PT Time Calculation (min) (ACUTE ONLY): 31 min   Charges:   PT Evaluation $PT Eval Low Complexity: 1 Low PT Treatments $Therapeutic Activity: 8-22 mins  Leitha Bleak, PT 12/20/18, 11:10 AM

## 2018-12-20 NOTE — Progress Notes (Signed)
Subjective:  POD #2 s/p left hip hemiarthroplasty.  Patient has advanced dementia and is unable to provide accurate history.   Patient denies any significant left hip pain.    Objective:   VITALS:   Vitals:   12/19/18 1356 12/19/18 1526 12/19/18 2352 12/20/18 0820  BP: (!) 99/52 103/61 106/64   Pulse: 73 71 85 86  Resp:  17 18 18   Temp:  97.9 F (36.6 C) 98.7 F (37.1 C) 98.4 F (36.9 C)  TempSrc:  Oral  Oral  SpO2: 99% 99% 98% 96%  Weight:      Height:        PHYSICAL EXAM: Left lower extremity Neurovascular intact Sensation intact distally Intact pulses distally Dorsiflexion/Plantar flexion intact Incision: moderate drainage No cellulitis present Compartment soft  LABS  Results for orders placed or performed during the hospital encounter of 12/17/18 (from the past 24 hour(s))  Glucose, capillary     Status: Abnormal   Collection Time: 12/19/18  4:51 PM  Result Value Ref Range   Glucose-Capillary 131 (H) 70 - 99 mg/dL   Comment 1 Notify RN   Comprehensive metabolic panel     Status: Abnormal   Collection Time: 12/20/18  4:23 AM  Result Value Ref Range   Sodium 143 135 - 145 mmol/L   Potassium 3.4 (L) 3.5 - 5.1 mmol/L   Chloride 111 98 - 111 mmol/L   CO2 21 (L) 22 - 32 mmol/L   Glucose, Bld 129 (H) 70 - 99 mg/dL   BUN 20 8 - 23 mg/dL   Creatinine, Ser 0.92 0.44 - 1.00 mg/dL   Calcium 8.2 (L) 8.9 - 10.3 mg/dL   Total Protein 5.8 (L) 6.5 - 8.1 g/dL   Albumin 2.9 (L) 3.5 - 5.0 g/dL   AST 23 15 - 41 U/L   ALT 10 0 - 44 U/L   Alkaline Phosphatase 54 38 - 126 U/L   Total Bilirubin 0.7 0.3 - 1.2 mg/dL   GFR calc non Af Amer 53 (L) >60 mL/min   GFR calc Af Amer >60 >60 mL/min   Anion gap 11 5 - 15  CBC     Status: Abnormal   Collection Time: 12/20/18  4:23 AM  Result Value Ref Range   WBC 7.3 4.0 - 10.5 K/uL   RBC 2.87 (L) 3.87 - 5.11 MIL/uL   Hemoglobin 8.6 (L) 12.0 - 15.0 g/dL   HCT 27.9 (L) 36.0 - 46.0 %   MCV 97.2 80.0 - 100.0 fL   MCH 30.0 26.0 -  34.0 pg   MCHC 30.8 30.0 - 36.0 g/dL   RDW 13.4 11.5 - 15.5 %   Platelets 121 (L) 150 - 400 K/uL   nRBC 0.0 0.0 - 0.2 %  Glucose, capillary     Status: Abnormal   Collection Time: 12/20/18  9:05 AM  Result Value Ref Range   Glucose-Capillary 125 (H) 70 - 99 mg/dL  Glucose, capillary     Status: Abnormal   Collection Time: 12/20/18 11:48 AM  Result Value Ref Range   Glucose-Capillary 118 (H) 70 - 99 mg/dL    No results found.  Assessment/Plan: 2 Days Post-Op   Principal Problem:   Left displaced femoral neck fracture (HCC) Active Problems:   Stroke (cerebrum) (HCC)   Diabetes mellitus without complication ([place])   Fall at home, initial encounter   Dementia with behavioral disturbance ([place])   Acute blood loss anemia   Thrombocytopenia (HCC)   Protein-calorie malnutrition, severe  Patient should continue using her hip abduction pillow while in bed.  Continue physical and occupational therapy as she can participate.  Patient should be screened for skilled nursing facility upon discharge.  Continue Lovenox for DVT prophylaxis.  Patient is weightbearing as tolerated on the left lower extremity.  She will follow-up in my office within 2 weeks of discharge from the hospital.  Patient should continue Lovenox until follow-up.    Juanell Fairly , MD 12/20/2018, 2:52 PM

## 2018-12-20 NOTE — Plan of Care (Signed)

## 2018-12-20 NOTE — Progress Notes (Addendum)
OT Cancellation Note  Patient Details Name: Stephanie Graham MRN: [id number] DOB: [birth year]   Cancelled Treatment:    Reason Eval/Treat Not Completed: Patient at procedure or test/ unavailable  OT order received and chart reviewed. When OT presents for evaluation, pt sitting EOB with PT and rehab tech. Will f/u as time permits for OT evaluation.   Gerrianne Scale, [place], OTR/L ascom [phone] 12/20/18, 10:33 AM

## 2018-12-20 NOTE — TOC Initial Note (Signed)
Transition of Care [facility]) - Initial/Assessment Note    Patient Details  Name: Stephanie Graham MRN: [id number] Date of Birth: 06/07/1921  [facility]) CM/SW Contact:    Shelbie Hutching, RN Phone Number: 12/20/2018, 4:04 PM  Clinical Narrative:                 Patient is from home where she lives with her granddaughter.  Patient was at home when she fell and fractured her hip.  Patient will require short term rehab at discharge.  Patient's daughter would like for patient to go to [facility], bed request has been sent out to [place].   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  The patient's daughter and granddaughter provide care for the patient with the CAP's program working for [employer] by [employer].  Patient has a case worker through [employer], [employer].  [name] will let the case worker know about the current discharge plan.   Insurance authorization through [facility] started today, facility pending, reference number [id number].     Expected Discharge Plan: Skilled Nursing Facility Barriers to Discharge: Continued Medical Work up   Patient Goals and CMS Choice Patient states their goals for this hospitalization and ongoing recovery are:: to return home after rehab CMS Medicare.gov Compare Post Acute Care list provided to:: Patient Represenative (must comment)(daughter Rise Mu) Choice offered to / list presented to : Adult Children  Expected Discharge Plan and Services Expected Discharge Plan: [place] In-house Referral: Clinical Social Work Discharge Planning Services: CM Consult Post Acute Care Choice: [place] Living arrangements for the past 2 months: [place]                                      Prior Living Arrangements/Services Living arrangements for the past 2 months: Single Family Home Lives with::  Relatives(granddaughter) Patient language and need for interpreter reviewed:: Yes Do you feel safe going back to the place where you live?: Yes      Need for Family Participation in Patient Care: Yes (Comment)(hip fracture) Care giver support system in place?: Yes (comment)(daughter and granddaughter) Current home services: DME(rollator and cane) Criminal Activity/Legal Involvement Pertinent to Current Situation/Hospitalization: No - Comment as needed  Activities of Daily Living Home Assistive Devices/Equipment: Dentures (specify type), Eyeglasses, Cane (specify quad or straight), Walker (specify type) ADL Screening (condition at time of admission) Patient's cognitive ability adequate to safely complete daily activities?: No Is the patient deaf or have difficulty hearing?: Yes Does the patient have difficulty seeing, even when wearing glasses/contacts?: Yes Does the patient have difficulty concentrating, remembering, or making decisions?: Yes Patient able to express need for assistance with ADLs?: Yes Does the patient have difficulty dressing or bathing?: Yes Independently performs ADLs?: No Communication: Independent Dressing (OT): Needs assistance Is this a change from baseline?: Pre-admission baseline Grooming: Needs assistance Is this a change from baseline?: Pre-admission baseline Feeding: Independent Bathing: Needs assistance Is this a change from baseline?: Pre-admission baseline Toileting: Needs assistance Is this a change from baseline?: Pre-admission baseline Does the patient have difficulty walking or climbing stairs?: Yes Weakness of Legs: Both Weakness of Arms/Hands: None  Permission Sought/Granted Permission sought to share information with : Case Manager, Customer service manager Permission granted to share information with : Yes, Verbal Permission Granted  Permission granted to share info w AGENCY: Armed forces operational officer        Emotional  Assessment Appearance:: Appears stated age Attitude/Demeanor/Rapport: Other (comment), Unable to Assess     Alcohol / Substance Use: Not Applicable Psych Involvement: No (comment)  Admission diagnosis:  Fall [W19.XXXA] Pre-op chest exam [Z01.811] Closed fracture of left hip, initial encounter [facility]) [S72.002A] Patient Active Problem List   Diagnosis Date Noted  . Acute blood loss anemia 12/19/2018  . Thrombocytopenia (HCC) 12/19/2018  . Protein-calorie malnutrition, severe 12/19/2018  . Left displaced femoral neck fracture (HCC) 12/18/2018  . Fall at home, initial encounter 12/18/2018  . Dementia with behavioral disturbance (HCC) 12/18/2018  . Hypertension   . Diabetes mellitus without complication (HCC)   . 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 Malnutrition of moderate degree 02/24/2018  . Hypothermia 02/21/2018  . 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 Syncope and collapse 01/18/2017  . HTN (hypertension) 01/18/2017  . Diabetes (HCC) 01/18/2017  . Nausea vomiting and diarrhea 01/18/2017   PCP:  Marisue Ivan, MD Pharmacy:   [facility] [phone] [place], [place] - [address] AT [facility] [address] [place] [address] Phone: [phone] Fax: [phone]     Social Determinants of Health (SDOH) Interventions    Readmission Risk Interventions No flowsheet data found.

## 2018-12-20 NOTE — [place] FL2 (Signed)
[place] MEDICAID FL2 LEVEL OF CARE SCREENING TOOL     IDENTIFICATION  Patient Name: Stephanie Graham Birthdate: 10-Feb-1921 Sex: female Admission Date (Current Location): 12/17/2018  [place]   Facility and Address:  [facility], [address], [place], [place] [address]      Provider Number: [id number]  Attending Physician Name and Address:  Standley BrookingGoodrich, Daniel P, MD  Relative Name and Phone Number:  Rober MinionJeanette Moore [phone]    Current Level of Care: Hospital Recommended Level of Care: Skilled Nursing Facility Prior Approval Number:    Date Approved/Denied:   PASRR Number: Pending  Discharge Plan: SNF    Current Diagnoses: Patient Active Problem List   Diagnosis Date Noted  . Acute blood loss anemia 12/19/2018  . Thrombocytopenia (HCC) 12/19/2018  . Protein-calorie malnutrition, severe 12/19/2018  . Left displaced femoral neck fracture (HCC) 12/18/2018  . Fall at home, initial encounter 12/18/2018  . Dementia with behavioral disturbance (HCC) 12/18/2018  . Hypertension   . Diabetes mellitus without complication (HCC)   . 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 Malnutrition of moderate degree 02/24/2018  . Hypothermia 02/21/2018  . 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 Syncope and collapse 01/18/2017  . HTN (hypertension) 01/18/2017  . Diabetes (HCC) 01/18/2017  . Nausea vomiting and diarrhea 01/18/2017    Orientation RESPIRATION BLADDER Height & Weight     Self  Normal Incontinent Weight: 54.4 kg Height:  5\' 2"  (157.5 cm)  BEHAVIORAL SYMPTOMS/MOOD NEUROLOGICAL BOWEL NUTRITION STATUS      Continent Diet  AMBULATORY STATUS COMMUNICATION OF NEEDS Skin   Extensive Assist Verbally Surgical wounds(left hip)                       Personal Care Assistance Level of Assistance  Bathing, Feeding, Dressing Bathing Assistance: Maximum assistance Feeding assistance: Limited  assistance Dressing Assistance: Maximum assistance     Functional Limitations Info  Sight Sight Info: Impaired(Blind right eye)        SPECIAL CARE FACTORS FREQUENCY  PT (By licensed PT), OT (By licensed OT)     PT Frequency: 5 times per week OT Frequency: 5 times per week            Contractures Contractures Info: Not present    Additional Factors Info  Allergies, Code Status Code Status Info: Full Allergies Info: hydrochlorothiazide, simvastatin, verapamil           Current Medications (12/20/2018):  This is the current hospital active medication list Current Facility-Administered Medications  Medication Dose Route Frequency Provider Last Rate Last Dose  . 0.9 %  sodium chloride infusion   Intravenous Continuous Standley BrookingGoodrich, Daniel P, MD 125 mL/hr at 12/20/18 [phone]    . acetaminophen (TYLENOL) tablet 1,000 mg  1,000 mg Oral Q8H Juanell FairlyKrasinski, Kevin, MD   1,000 mg at 12/20/18 0520  . alum & mag hydroxide-simeth (MAALOX/MYLANTA) 200-200-20 MG/5ML suspension 30 mL  30 mL Oral Q4H PRN Juanell FairlyKrasinski, Kevin, MD      . aspirin EC tablet 325 mg  325 mg Oral Daily Juanell FairlyKrasinski, Kevin, MD   325 mg at 12/20/18 0933  . bisacodyl (DULCOLAX) suppository 10 mg  10 mg Rectal Daily PRN Juanell FairlyKrasinski, Kevin, MD   10 mg at 12/20/18 1207  . calcium-vitamin D (OSCAL WITH D) 500-200 MG-UNIT per tablet 2 tablet  2 tablet Oral Q breakfast Juanell FairlyKrasinski, Kevin, MD   2 tablet at 12/20/18 0933  .  carvedilol (COREG) tablet 6.25 mg  6.25 mg Oral BID WC Samuella Cota, MD   6.25 mg at 12/20/18 0932  . Chlorhexidine Gluconate Cloth 2 % PADS 6 each  6 each Topical Q0600 Samuella Cota, MD   6 each at 12/20/18 1205  . docusate sodium (COLACE) capsule 100 mg  100 mg Oral BID Thornton Park, MD   100 mg at 12/20/18 0932  . donepezil (ARICEPT) tablet 10 mg  10 mg Oral QHS Thornton Park, MD   10 mg at 12/19/18 2151  . enoxaparin (LOVENOX) injection 30 mg  30 mg Subcutaneous Q24H Samuella Cota, MD   30 mg at  12/20/18 0935  . feeding supplement (GLUCERNA SHAKE) (GLUCERNA SHAKE) liquid 237 mL  237 mL Oral TID BM Samuella Cota, MD   237 mL at 12/20/18 0934  . HYDROcodone-acetaminophen (NORCO/VICODIN) 5-325 MG per tablet 1-2 tablet  1-2 tablet Oral Q4H PRN Thornton Park, MD   1 tablet at 12/20/18 0933  . insulin aspart (novoLOG) injection 0-9 Units  0-9 Units Subcutaneous TID WC Thornton Park, MD   1 Units at 12/20/18 [phone]  . loratadine (CLARITIN) tablet 10 mg  10 mg Oral Daily Thornton Park, MD   10 mg at 12/20/18 0933  . magnesium citrate solution 1 Bottle  1 Bottle Oral Once PRN Thornton Park, MD      . methocarbamol (ROBAXIN) tablet 500 mg  500 mg Oral Q6H PRN Thornton Park, MD       Or  . methocarbamol (ROBAXIN) 500 mg in dextrose 5 % 50 mL IVPB  500 mg Intravenous Q6H PRN Thornton Park, MD      . multivitamin with minerals tablet 1 tablet  1 tablet Oral Daily Samuella Cota, MD   1 tablet at 12/20/18 0932  . mupirocin ointment (BACTROBAN) 2 %   Nasal BID Samuella Cota, MD      . ondansetron [facility]) tablet 4 mg  4 mg Oral Q6H PRN Thornton Park, MD       Or  . ondansetron [facility]) injection 4 mg  4 mg Intravenous Q6H PRN Thornton Park, MD      . oxyCODONE (Oxy IR/ROXICODONE) immediate release tablet 2.5 mg  2.5 mg Oral Q6H PRN Thornton Park, MD      . polyethylene glycol (MIRALAX / GLYCOLAX) packet 17 g  17 g Oral Daily PRN Thornton Park, MD      . potassium chloride (KLOR-CON) CR tablet 30 mEq  30 mEq Oral Once Lu Duffel, RPH      . pravastatin (PRAVACHOL) tablet 80 mg  80 mg Oral q1800 Thornton Park, MD   80 mg at 12/19/18 1700  . QUEtiapine (SEROQUEL) tablet 50 mg  50 mg Oral QHS Thornton Park, MD   50 mg at 12/19/18 2151  . senna (SENOKOT) tablet 8.6 mg  1 tablet Oral BID Thornton Park, MD   8.6 mg at 12/20/18 0934  . senna-docusate (Senokot-S) tablet 1 tablet  1 tablet Oral QHS PRN Thornton Park, MD      . sertraline (ZOLOFT)  tablet 25 mg  25 mg Oral BID Thornton Park, MD   25 mg at 12/20/18 0935  . traZODone (DESYREL) tablet 50 mg  50 mg Oral QHS PRN Thornton Park, MD   50 mg at 12/20/18 0045     Discharge Medications: Please see discharge summary for a list of discharge medications.  Relevant Imaging Results:  Relevant Lab Results:   Additional Information  SSN [id number]  Allayne Butcher, RN

## 2018-12-21 LAB — COMPREHENSIVE METABOLIC PANEL
ALT: 12 U/L (ref 0–44)
AST: 23 U/L (ref 15–41)
Albumin: 2.6 g/dL — ABNORMAL LOW (ref 3.5–5.0)
Alkaline Phosphatase: 57 U/L (ref 38–126)
Anion gap: 7 (ref 5–15)
BUN: 17 mg/dL (ref 8–23)
CO2: 22 mmol/L (ref 22–32)
Calcium: 8.1 mg/dL — ABNORMAL LOW (ref 8.9–10.3)
Chloride: 115 mmol/L — ABNORMAL HIGH (ref 98–111)
Creatinine, Ser: 0.62 mg/dL (ref 0.44–1.00)
GFR calc Af Amer: >60 mL/min (ref >60)
GFR calc non Af Amer: >60 mL/min (ref >60)
Glucose, Bld: 133 mg/dL — ABNORMAL HIGH (ref 70–99)
Potassium: 4.2 mmol/L (ref 3.5–5.1)
Sodium: 144 mmol/L (ref 135–145)
Total Bilirubin: 0.6 mg/dL (ref 0.3–1.2)
Total Protein: 5.6 g/dL — ABNORMAL LOW (ref 6.5–8.1)

## 2018-12-21 LAB — CBC
HCT: 24.5 % — ABNORMAL LOW (ref 36.0–46.0)
HCT: 26.6 % — ABNORMAL LOW (ref 36.0–46.0)
Hemoglobin: 8 g/dL — ABNORMAL LOW (ref 12.0–15.0)
Hemoglobin: 8.3 g/dL — ABNORMAL LOW (ref 12.0–15.0)
MCH: 30.8 pg (ref 26.0–34.0)
MCH: 30.7 pg (ref 26.0–34.0)
MCHC: 32.7 g/dL (ref 30.0–36.0)
MCHC: 31.2 g/dL (ref 30.0–36.0)
MCV: 94.2 fL (ref 80.0–100.0)
MCV: 98.5 fL (ref 80.0–100.0)
Platelets: 127 10*3/uL — ABNORMAL LOW (ref 150–400)
Platelets: 141 10*3/uL — ABNORMAL LOW (ref 150–400)
RBC: 2.6 MIL/uL — ABNORMAL LOW (ref 3.87–5.11)
RBC: 2.7 MIL/uL — ABNORMAL LOW (ref 3.87–5.11)
RDW: 13.8 % (ref 11.5–15.5)
RDW: 13.9 % (ref 11.5–15.5)
WBC: 6.2 10*3/uL (ref 4.0–10.5)
WBC: 6.6 10*3/uL (ref 4.0–10.5)
nRBC: 0 % (ref 0.0–0.2)
nRBC: 0 % (ref 0.0–0.2)

## 2018-12-21 LAB — SARS CORONAVIRUS 2 (TAT 6-24 HRS): SARS Coronavirus 2: NEGATIVE

## 2018-12-21 LAB — SURGICAL PATHOLOGY

## 2018-12-21 LAB — GLUCOSE, CAPILLARY
Glucose-Capillary: 120 mg/dL — ABNORMAL HIGH (ref 70–99)
Glucose-Capillary: 139 mg/dL — ABNORMAL HIGH (ref 70–99)
Glucose-Capillary: 212 mg/dL — ABNORMAL HIGH (ref 70–99)

## 2018-12-21 MED ORDER — POLYETHYLENE GLYCOL 3350 17 G PO PACK: 17.0000 g | PACK | Freq: Every day | ORAL | Status: DC | PRN

## 2018-12-21 MED ORDER — ACETAMINOPHEN ER 650 MG PO TBCR: 650.0000 mg | EXTENDED_RELEASE_TABLET | Freq: Three times a day (TID) | ORAL | Status: AC | PRN

## 2018-12-21 MED ORDER — CALCIUM CARBONATE-VITAMIN D 500-200 MG-UNIT PO TABS: 2.0000 | ORAL_TABLET | Freq: Every day | ORAL | Status: DC

## 2018-12-21 MED ORDER — GLUCERNA SHAKE PO LIQD: 237.0000 mL | Freq: Three times a day (TID) | ORAL | 0 refills | Status: AC

## 2018-12-21 MED ORDER — ENOXAPARIN SODIUM 40 MG/0.4ML ~~LOC~~ SOLN: 40.0000 mg | SUBCUTANEOUS | Status: DC

## 2018-12-21 MED ORDER — HYDROCODONE-ACETAMINOPHEN 5-325 MG PO TABS: 1.0000 | ORAL_TABLET | Freq: Four times a day (QID) | ORAL | 0 refills | Status: DC | PRN

## 2018-12-21 MED ORDER — ADULT MULTIVITAMIN W/MINERALS CH: 1.0000 | ORAL_TABLET | Freq: Every day | ORAL | Status: AC

## 2018-12-21 MED ORDER — ENOXAPARIN SODIUM 30 MG/0.3ML ~~LOC~~ SOLN: 30.0000 mg | SUBCUTANEOUS | 0 refills | Status: DC

## 2018-12-21 NOTE — Progress Notes (Signed)
Report called and given to Kim at [employer]. IV removed. Patient in stable condition. Now waiting on EMS for transport to [facility].

## 2018-12-21 NOTE — Progress Notes (Signed)
Anticoagulation monitoring(Lovenox):  85yo  Female ordered Lovenox 30 mg Q24h  Filed Weights   12/17/18 2111  Weight: 120 lb (54.4 kg)   Body mass index is 21.95 kg/m.   Lab Results  Component Value Date   CREATININE 0.62 12/21/2018   CREATININE 0.92 12/20/2018   CREATININE 1.02 (H) 12/19/2018   Estimated Creatinine Clearance: 33.3 mL/min (by C-G formula based on SCr of 0.62 mg/dL). Hemoglobin & Hematocrit     Component Value Date/Time   HGB 8.0 (L) 12/21/2018 0433   HGB 12.6 12/29/2011 0335   HCT 24.5 (L) 12/21/2018 0433   HCT 37.7 12/29/2011 0335     Per Protocol for Patient with estCrcl > 30 ml/min and BMI < 40, will transition back to Lovenox 40 mg Q24h.

## 2018-12-21 NOTE — Care Management Important Message (Signed)
Important Message  Patient Details  Name: Stephanie Graham MRN: [id number] Date of Birth: 1921/07/15   Medicare Important Message Given:  Yes     [name] 12/21/2018, 11:24 AM

## 2018-12-21 NOTE — Evaluation (Signed)
Occupational Therapy Evaluation Patient Details Name: Stephanie Graham MRN: [id number] DOB: January 16, 1922 Today's Date: 12/21/2018    History of Present Illness Pt is a 85 y.o. female presenting to hospital s/p unwitnessed fall with pain in L hip and L knee.  Imaging showing L femoral neck fx and s/p L hip hemiarthroplasty 12/18/18.  PMH includes blind R eye, dementia, DM, htn, h/o stroke, L TKR.   Clinical Impression   Stephanie Graham was seen for OT/PT Co-evaluation/treatment session, POD#1 from above surgery. Information on PLOF obtained from chair review as pt was unable to answer A&O questions at start of OT sessions and remained pleasantly confused throughout. Pt currently requires moderate to maximal assist for LB dressing while in seated position due decreased cognition as well as pain and limited AROM of L hip. Pt unable to recall posterior hip precaution and observed to attempt to cross her legs upon sitting in her room recliner. OT provides multimodal prompting/cueing to support pt adherence to posterior hip precautions during the session. OT also engages pt in self-feeding tasks, pt requires consistent object and physical prompting to support self-feeding this date. Pt left with hip abduction pillow in place to maximize safety and promote proper positioning once seated up in chair. Pt would benefit from additional instruction in self care skills and techniques to help maintain precautions with or without assistive devices to support recall and carryover prior to discharge. Recommend STR upon discharge.       Follow Up Recommendations  SNF;Supervision/Assistance - 24 hour    Equipment Recommendations  3 in 1 bedside commode    Recommendations for Other Services       Precautions / Restrictions Precautions Precautions: Fall;Posterior Hip Precaution Comments: ABduction pillow in bed Restrictions Weight Bearing Restrictions: Yes LLE Weight Bearing: Weight bearing as tolerated       Mobility Bed Mobility Overal bed mobility: Needs Assistance Bed Mobility: Supine to Sit     Supine to sit: Mod assist;+2 for physical assistance        Transfers Overall transfer level: Needs assistance Equipment used: Rolling walker (2 wheeled) Transfers: Sit to/from Stand Sit to Stand: Mod assist;+2 physical assistance              Balance Overall balance assessment: Needs assistance Sitting-balance support: Bilateral upper extremity supported;Feet supported Sitting balance-Leahy Scale: Fair Sitting balance - Comments: able to sit with supervision but unsfe to be left unattened due to cognition and balance Postural control: Posterior lean Standing balance support: Bilateral upper extremity supported Standing balance-Leahy Scale: Poor Standing balance comment: requires +2 hands on at all times                           ADL either performed or assessed with clinical judgement   ADL Overall ADL's : Needs assistance/impaired                                       General ADL Comments: Pt currently requires moderate to max assist for ADL tasks due to impaired cognition as well as pain and limited AROM of her L hip. Pt requires max multi-modal cueing throughout session and does best with physical or object prompting to initiate tasks such as self-feeding.     Vision   Additional Comments: Pt unable to state.     Perception     Praxis  Pertinent Vitals/Pain Pain Assessment: Faces Faces Pain Scale: Hurts a little bit Pain Location: L hip with mobility Pain Descriptors / Indicators: Grimacing;Operative site guarding Pain Intervention(s): Limited activity within patient's tolerance;Monitored during session;Repositioned     Hand Dominance     Extremity/Trunk Assessment Upper Extremity Assessment Upper Extremity Assessment: Generalized weakness;Difficult to assess due to impaired cognition   Lower Extremity Assessment Lower  Extremity Assessment: Overall WFL for tasks assessed;LLE deficits/detail LLE Deficits / Details: s/p L hip hemiarthroplasty LLE Coordination: decreased gross motor   Cervical / Trunk Assessment Cervical / Trunk Assessment: Normal   Communication Communication Communication: HOH   Cognition Arousal/Alertness: Awake/alert Behavior During Therapy: Restless;Impulsive Overall Cognitive Status: No family/caregiver present to determine baseline cognitive functioning                                 General Comments: Pt did not answer any A&O questions (did not state name or DOB when asked); Repetitive & hyperverbal throughout session speaking about tangential/unrelated topics including high schools, past teachers, etc. At times difficult to understand. Repeating "thank you very much" throughout session.   General Comments       Exercises Other Exercises Other Exercises: Pt education limtied due to impaired cognition. Pt educated to not cross her legs and cued for posterior hip precautions through. Recall/carryover limited. Would benefit from caregiver education in posterior hip precaution management during ADL tasks.   Shoulder Instructions      Home Living Family/patient expects to be discharged to:: Private residence Living Arrangements: Other relatives Available Help at Discharge: Family                             Additional Comments: No family present to verify home environment and pt unable to verbalize home environment.      Prior Functioning/Environment          Comments: Per chart review pt was ambulatory and walks a lot at grocery store with buggy; also h/o hallucinations (happens intermittently per chart review)        OT Problem List: Decreased strength;Decreased coordination;Decreased cognition;Decreased activity tolerance;Decreased safety awareness;Impaired balance (sitting and/or standing);Decreased knowledge of use of DME or AE;Decreased  knowledge of precautions      OT Treatment/Interventions: Self-care/ADL training;Therapeutic exercise;Therapeutic activities;DME and/or AE instruction;Patient/family education;Cognitive remediation/compensation;Energy conservation    OT Goals(Current goals can be found in the care plan section) Acute Rehab OT Goals Patient Stated Goal: pt did not state any goals OT Goal Formulation: With patient Time For Goal Achievement: 01/04/19 Potential to Achieve Goals: Good ADL Goals Pt Will Perform Lower Body Dressing: with mod assist;sit to/from stand;with adaptive equipment;with caregiver independent in assisting(With LRAD PRN for improved safety and functional independence.) Pt Will Transfer to Toilet: bedside commode;with mod assist;ambulating(With LRAD PRN for improved safety and functional independence.) Pt Will Perform Toileting - Clothing Manipulation and hygiene: sit to/from stand;with mod assist;with caregiver independent in assisting;with adaptive equipment(With LRAD PRN for improved safety and functional independence.)  OT Frequency: Min 2X/week   Barriers to D/C: Inaccessible home environment          Co-evaluation PT/OT/SLP Co-Evaluation/Treatment: Yes Reason for Co-Treatment: Complexity of the patient's impairments (multi-system involvement);Necessary to address cognition/behavior during functional activity;For patient/therapist safety;To address functional/ADL transfers PT goals addressed during session: Mobility/safety with mobility;Proper use of DME;Strengthening/ROM OT goals addressed during session: ADL's and self-care;Proper use of Adaptive equipment and DME;Strengthening/ROM  AM-PAC OT "6 Clicks" Daily Activity     Outcome Measure Help from another person eating meals?: A Lot Help from another person taking care of personal grooming?: A Lot Help from another person toileting, which includes using toliet, bedpan, or urinal?: A Lot Help from another person bathing  (including washing, rinsing, drying)?: A Lot Help from another person to put on and taking off regular upper body clothing?: A Lot Help from another person to put on and taking off regular lower body clothing?: A Lot 6 Click Score: 12   End of Session Equipment Utilized During Treatment: Gait belt;Rolling walker  Activity Tolerance: Patient tolerated treatment well Patient left: in chair;with call bell/phone within reach;with chair alarm set;Other (comment)(With heels floated and hip abduction pillow in place.)  OT Visit Diagnosis: Other abnormalities of gait and mobility (R26.89);Muscle weakness (generalized) (M62.81)                Time: 6967-8938 OT Time Calculation (min): 27 min Charges:  OT General Charges $OT Visit: 1 Visit OT Evaluation $OT Eval Moderate Complexity: 1 Mod OT Treatments $Self Care/Home Management : 8-22 mins  Rockney Ghee, M.S., OTR/L Ascom: [phone] 12/21/18, 10:39 AM

## 2018-12-21 NOTE — Progress Notes (Signed)
Subjective:  POD #4 s/p left hip hemiarthroplasty.   Patient is confused and agitated.  She has advanced dementia.  Her nurse is at the bedside.  She reports the patient is removing her abduction pillow while in bed.  Objective:   VITALS:   Vitals:   12/21/18 0817 12/21/18 1555 12/21/18 1845 12/21/18 2018  BP: (!) 156/72 (!) 154/68 (!) 148/72 (!) 182/91  Pulse: 78 68 70 77  Resp:  18 16 17   Temp:  97.8 F (36.6 C) 97.9 F (36.6 C) 98.2 F (36.8 C)  TempSrc:      SpO2: 100% 100% 98% 100%  Weight:      Height:        PHYSICAL EXAM: Left lower extremity: Patient has spontaneous movement of the left lower extremity.  Her bandage is clean dry and intact.  It has been changed.  Patient has soft lower extremity compartments.  She has palpable pedal pulses.  Is difficult to assess sensation based on the patient's confusion and agitation currently.   LABS  Results for orders placed or performed during the hospital encounter of 12/17/18 (from the past 24 hour(s))  Glucose, capillary     Status: Abnormal   Collection Time: 12/20/18  9:27 PM  Result Value Ref Range   Glucose-Capillary 125 (H) 70 - 99 mg/dL  Comprehensive metabolic panel     Status: Abnormal   Collection Time: 12/21/18  4:33 AM  Result Value Ref Range   Sodium 144 135 - 145 mmol/L   Potassium 4.2 3.5 - 5.1 mmol/L   Chloride 115 (H) 98 - 111 mmol/L   CO2 22 22 - 32 mmol/L   Glucose, Bld 133 (H) 70 - 99 mg/dL   BUN 17 8 - 23 mg/dL   Creatinine, Ser 0.62 0.44 - 1.00 mg/dL   Calcium 8.1 (L) 8.9 - 10.3 mg/dL   Total Protein 5.6 (L) 6.5 - 8.1 g/dL   Albumin 2.6 (L) 3.5 - 5.0 g/dL   AST 23 15 - 41 U/L   ALT 12 0 - 44 U/L   Alkaline Phosphatase 57 38 - 126 U/L   Total Bilirubin 0.6 0.3 - 1.2 mg/dL   GFR calc non Af Amer >60 >60 mL/min   GFR calc Af Amer >60 >60 mL/min   Anion gap 7 5 - 15  CBC     Status: Abnormal   Collection Time: 12/21/18  4:33 AM  Result Value Ref Range   WBC 6.2 4.0 - 10.5 K/uL   RBC 2.60  (L) 3.87 - 5.11 MIL/uL   Hemoglobin 8.0 (L) 12.0 - 15.0 g/dL   HCT 24.5 (L) 36.0 - 46.0 %   MCV 94.2 80.0 - 100.0 fL   MCH 30.8 26.0 - 34.0 pg   MCHC 32.7 30.0 - 36.0 g/dL   RDW 13.8 11.5 - 15.5 %   Platelets 127 (L) 150 - 400 K/uL   nRBC 0.0 0.0 - 0.2 %  Glucose, capillary     Status: Abnormal   Collection Time: 12/21/18  8:18 AM  Result Value Ref Range   Glucose-Capillary 120 (H) 70 - 99 mg/dL  CBC in AM     Status: Abnormal   Collection Time: 12/21/18 11:30 AM  Result Value Ref Range   WBC 6.6 4.0 - 10.5 K/uL   RBC 2.70 (L) 3.87 - 5.11 MIL/uL   Hemoglobin 8.3 (L) 12.0 - 15.0 g/dL   HCT 26.6 (L) 36.0 - 46.0 %   MCV 98.5 80.0 -  100.0 fL   MCH 30.7 26.0 - 34.0 pg   MCHC 31.2 30.0 - 36.0 g/dL   RDW 67.3 41.9 - 37.9 %   Platelets 141 (L) 150 - 400 K/uL   nRBC 0.0 0.0 - 0.2 %  Glucose, capillary     Status: Abnormal   Collection Time: 12/21/18 11:58 AM  Result Value Ref Range   Glucose-Capillary 139 (H) 70 - 99 mg/dL  Glucose, capillary     Status: Abnormal   Collection Time: 12/21/18  4:29 PM  Result Value Ref Range   Glucose-Capillary 212 (H) 70 - 99 mg/dL    No results found.  Assessment/Plan: 3 Days Post-Op   Principal Problem:   Left displaced femoral neck fracture (HCC) Active Problems:   Stroke (cerebrum) (HCC)   Diabetes mellitus without complication (HCC)   Fall at home, initial encounter   Dementia with behavioral disturbance (HCC)   Acute blood loss anemia   Thrombocytopenia (HCC)   Protein-calorie malnutrition, severe  Continue physical therapy as the patient can participate.  Patient is doing well from an orthopedic standpoint he may be discharged to rehab once medically cleared.  Patient should remain on Lovenox 40 mg daily for DVT prophylaxis for 2 weeks until follow-up in the office with Dr. Martha Clan.  Continue physical therapy for gait training, lower extremity strengthening and hip range of motion.      Juanell Fairly , MD 12/21/2018, 8:26  PM

## 2018-12-21 NOTE — TOC Transition Note (Signed)
Transition of Care [facility]) - CM/SW Discharge Note   Patient Details  Name: Stephanie Graham MRN: [id number] Date of Birth: 1921-07-18  [facility]) CM/SW Contact:  Su Hilt, RN Phone Number: 12/21/2018, 2:50 PM   Clinical Narrative:    [facility]06 The patient's daughter [name] has been made aware and is agreeable The bed side nurse to call report to [facility] and call EMS to transport when ready   Final next level of care: Skilled Nursing Facility Barriers to Discharge: Barriers Resolved   Patient Goals and CMS Choice Patient states their goals for this hospitalization and ongoing recovery are:: to return home after rehab CMS Medicare.gov Compare Post Acute Care list provided to:: Patient Represenative (must comment)(daughter Rise Mu) Choice offered to / list presented to : Adult Children  Discharge Placement                       Discharge Plan and Services In-house Referral: Clinical Social Work Discharge Planning Services: CM Consult Post Acute Care Choice: [place]                               Social Determinants of Health (SDOH) Interventions     Readmission Risk Interventions No flowsheet data found.

## 2018-12-21 NOTE — TOC Progression Note (Signed)
Transition of Care [facility]) - Progression Note    Patient Details  Name: Stephanie Graham MRN: [id number] Date of Birth: Dec 16, 1921  [facility]) CM/SW Contact  Su Hilt, RN Phone Number: 12/21/2018, 11:59 AM  Clinical Narrative:    [name] declined the bed offer, I called the daughter [name] at [phone] We reviewed the bed offers in detailed she stated the patient has been to [facility] before.  She will discuss the bed offers with the grand daughter and call me back in a few min with a choice    Expected Discharge Plan: Skilled Nursing Facility Barriers to Discharge: Continued Medical Work up  Expected Discharge Plan and Services Expected Discharge Plan: [place] In-house Referral: Clinical Social Work Discharge Planning Services: CM Consult Post Acute Care Choice: [employer] arrangements for the past 2 months: Single Family Home                                       Social Determinants of Health (SDOH) Interventions    Readmission Risk Interventions No flowsheet data found.

## 2018-12-21 NOTE — Progress Notes (Signed)
Physical Therapy Treatment Patient Details Name: Stephanie Graham MRN: [id number] DOB: 07-07-1921 Today's Date: 12/21/2018    History of Present Illness Pt is a 85 y.o. female presenting to hospital s/p unwitnessed fall with pain in L hip and L knee.  Imaging showing L femoral neck fx and s/p L hip hemiarthroplasty 12/18/18.  PMH includes blind R eye, dementia, DM, htn, h/o stroke, L TKR.    PT Comments    Pt to edge of bed with mod a x 2.  Once sitting, she is able to sit unsupported but requires close supervision for safety due to cognition and overall safety.  She is able to stand with mod a x 2 and take several small steps to recliner at bedside with encouragement.  Overall tolerated mobility will with little grimacing and overall seemed generally comfortable.  Remained in recliner.   Follow Up Recommendations  SNF     Equipment Recommendations  Rolling walker with 5" wheels;3in1 (PT);Wheelchair (measurements PT);Wheelchair cushion (measurements PT)    Recommendations for Other Services       Precautions / Restrictions Precautions Precautions: Fall;Posterior Hip Precaution Comments: ABduction pillow in bed Restrictions Weight Bearing Restrictions: Yes LLE Weight Bearing: Weight bearing as tolerated    Mobility  Bed Mobility Overal bed mobility: Needs Assistance Bed Mobility: Supine to Sit     Supine to sit: Mod assist;+2 for physical assistance        Transfers Overall transfer level: Needs assistance Equipment used: Rolling walker (2 wheeled) Transfers: Sit to/from Stand Sit to Stand: Mod assist;+2 physical assistance            Ambulation/Gait Ambulation/Gait assistance: Mod assist;+2 physical assistance Gait Distance (Feet): 3 Feet Assistive device: Rolling walker (2 wheeled) Gait Pattern/deviations: Step-to pattern;Decreased step length - right;Decreased step length - left;Decreased stance time - left Gait velocity: decreased   General Gait  Details: able to step to chair with encouragement   Stairs             Wheelchair Mobility    Modified Rankin (Stroke Patients Only)       Balance Overall balance assessment: Needs assistance Sitting-balance support: Bilateral upper extremity supported;Feet supported Sitting balance-Leahy Scale: Fair Sitting balance - Comments: able to sit with supervision but unsfe to be left unattened due to cognition and balance   Standing balance support: Bilateral upper extremity supported Standing balance-Leahy Scale: Poor Standing balance comment: requires +2 hands on at all times                            Cognition Arousal/Alertness: Awake/alert(pt sleeping upon PT arrival but woken with vc's) Behavior During Therapy: Restless;Impulsive Overall Cognitive Status: No family/caregiver present to determine baseline cognitive functioning                                 General Comments: Pt did not answer any A&O questions (did not state name or DOB when asked); Repetitive & hyperverbal throughout session speaking about tangential/unrelated topics including high schools, past teachers, etc. At times difficult to understand. Repeating "thank you very much" throughout session.      Exercises      General Comments        Pertinent Vitals/Pain Pain Assessment: Faces Faces Pain Scale: Hurts a little bit Pain Location: L hip with mobility Pain Descriptors / Indicators: Grimacing;Operative site guarding Pain Intervention(s): Limited activity within patient's  tolerance;Monitored during session    Home Living Family/patient expects to be discharged to:: Private residence Living Arrangements: Other relatives(Per chart, pt lives with her granddaughter.)             Additional Comments: No family present to verify home environment and pt unable to verbalize home environment.    Prior Function        Comments: Per chart review pt was ambulatory and  walks a lot at grocery store with buggy; also h/o hallucinations (happens intermittently per chart review)   PT Goals (current goals can now be found in the care plan section) Progress towards PT goals: Progressing toward goals    Frequency    7X/week      PT Plan Current plan remains appropriate    Co-evaluation PT/OT/SLP Co-Evaluation/Treatment: Yes Reason for Co-Treatment: Complexity of the patient's impairments (multi-system involvement);For patient/therapist safety PT goals addressed during session: Mobility/safety with mobility;Proper use of DME;Balance;Strengthening/ROM OT goals addressed during session: ADL's and self-care;Proper use of Adaptive equipment and DME;Strengthening/ROM      AM-PAC PT "6 Clicks" Mobility   Outcome Measure  Help needed turning from your back to your side while in a flat bed without using bedrails?: A Lot Help needed moving from lying on your back to sitting on the side of a flat bed without using bedrails?: A Lot Help needed moving to and from a bed to a chair (including a wheelchair)?: A Lot Help needed standing up from a chair using your arms (e.g., wheelchair or bedside chair)?: A Lot Help needed to walk in hospital room?: A Lot Help needed climbing 3-5 steps with a railing? : Total 6 Click Score: 11    End of Session Equipment Utilized During Treatment: Gait belt Activity Tolerance: Patient tolerated treatment well Patient left: in chair;with call bell/phone within reach;with chair alarm set Nurse Communication: Mobility status;Precautions;Weight bearing status;Other (comment) Pain - Right/Left: Left Pain - part of body: Hip     Time: 8119-1478 PT Time Calculation (min) (ACUTE ONLY): 20 min  Charges:                        Danielle Dess, PTA 12/21/18, 9:48 AM

## 2018-12-21 NOTE — TOC Progression Note (Signed)
Transition of Care [facility] - Progression Note    Patient Details  Name: Stephanie Graham MRN: [id number] Date of Birth: July 14, 1921  Transition of Care [employer]) CM/SW [place], RN Phone Number: 12/21/2018, 1:45 PM  Clinical Narrative:    Daughter called and decided on [facility] I provided the phone number to [facility] for her to call to fill out the papers I called [facility] at [phone] Ref number [id number] I provided the facility information to [facility] started date 12/1 next review date 12/3 Dollene Cleveland [phone] coodinator    Expected Discharge Plan: [place] Barriers to Discharge: Continued Medical Work up  Expected Discharge Plan and Services Expected Discharge Plan: [place] In-house Referral: Clinical Social Work Discharge Planning Services: CM Consult Post Acute Care Choice: [place] arrangements for the past 2 months: Single Family Home                                       Social Determinants of Health (SDOH) Interventions    Readmission Risk Interventions No flowsheet data found.

## 2018-12-21 NOTE — Discharge Summary (Signed)
Physician Discharge Summary  Stephanie Graham PXT:[id number] DOB: [birth year] DOA: 12/17/2018  PCP: Dion Body, MD  Admit date: 12/17/2018 Discharge date: 12/21/2018  Recommendations for Outpatient Follow-up:   Left femoral neck fracture status post fall at home.  As per H&P, patient cleared for surgery with no further testing recommended. Status post left hip hemiarthroplasty 11/28 --Continue using her hip abduction pillow while in bed --Continue weight-bear as tolerated left lower extremity, PT and OT evaluations.  Posterior hip precautions when in bed. --Continue enoxaparin for DVT prophylaxis per orthopedics until follow-up. --Continue analgesia as needed --f/u with Dr. Mack Guise in 2 weeks, continue Lovenox for DVT prophylaxis until follow-up     Contact information for follow-up providers    Thornton Park, MD. Schedule an appointment as soon as possible for a visit in 2 week(s).   Specialty: Orthopedic Surgery Contact information: [place] [address] [phone]            Contact information for after-discharge care    Destination    [facility] Preferred SNF .   Service: Skilled Nursing Contact information: [address] [place] [phone]                   Discharge Diagnoses: Principal diagnosis is #1 1. Left femoral neck fracture status post fall at home 2. Acute blood loss anemia 3. Thrombocytopenia 4. Diabetes mellitus type 2 5. Essential hypertension 6. Dementia with behavioral disturbance (hallucinations), depression  Discharge Condition: improved Disposition: SNF short-term  Diet recommendation: Regular  Filed Weights   12/17/18 2111  Weight: 54.4 kg    History of present illness:  85 year old woman from home (lives with daughter) PMH diabetes mellitus type 2, dementia, right eye blindness presented after an unwitnessed mechanical fall resulting in left  hip pain.  Per report patient was hallucinating (this happens intermittently) when granddaughter heard a noise and found her on the floor.  Imaging revealed left femoral neck fracture and the patient was admitted for definitive management per orthopedics.  Hospital Course:  Patient was admitted, underwent left hip hemiarthroplasty successfully.  Postoperatively she developed mild acute blood loss anemia and thrombocytopenia which has stabilized.  Her dementia and confusion appears to be at baseline.  Hospitalization was uncomplicated.  Individual issues as below.  Left femoral neck fracture status post fall at home.  As per H&P, patient cleared for surgery with no further testing recommended. Status post left hip hemiarthroplasty 11/28 --Continue using her hip abduction pillow while in bed --Continue weight-bear as tolerated left lower extremity, PT and OT evaluations.  Posterior hip precautions when in bed. --Continue enoxaparin for DVT prophylaxis per orthopedics until follow-up. --Continue analgesia as needed --f/u with Dr. Mack Guise in 2 weeks, continue Lovenox for DVT prophylaxis until follow-up  Acute blood loss anemia --Perioperative nature.  Has stabilized.  No further evaluation suggested.  Thrombocytopenia --Perioperative in nature.  Trending back up.  No further evaluation suggested.  Diabetes mellitus type 2 --Remained stable.  Can resume metformin on discharge.  Essential hypertension --Stable, continue carvedilol  Dementia with behavioral disturbance (hallucinations), depression --Appears stable.  Continue Aricept, quetiapine, Zoloft  PMH: Stroke, left bundle branch block --Continue Pravachol, aspirin  Significant Hospital Events    11/27 admitted for left hip fracture  Consults:   Orthopedics  Procedures:   11/28 left hip hemiarthroplasty  Significant Diagnostic Tests:   11/27 hip film left femoral neck fracture, chest x-ray no acute disease, CT  head no acute  disease, EKG SR, LBBB, no acute changes (10/17/2018)  Today's assessment: S: Confused, but seems to feel okay. O: Vitals:  Vitals:   12/21/18 0434 12/21/18 0817  BP: (!) 156/74 (!) 156/72  Pulse: 72 78  Resp: 17   Temp: 98 F (36.7 C)   SpO2: 100% 100%    Constitutional.  Appears calm, comfortable currently. Respiratory.  Clear to auscultation bilaterally.  No wheezes, rales or rhonchi.  Normal respiratory effort. Cardiovascular.  Regular rate and rhythm.  No murmur, rub or gallop.  No lower extremity edema. Psychiatric.  Confused.   Discharge Instructions   Allergies as of 12/21/2018      Reactions   Hydrochlorothiazide W-triamterene    Other reaction(s): Unknown   Simvastatin    Other reaction(s): Unknown   Verapamil    Other reaction(s): Unknown      Medication List    STOP taking these medications   meloxicam 7.5 MG tablet Commonly known as: MOBIC     TAKE these medications   acetaminophen 650 MG CR tablet Commonly known as: TYLENOL Take 1 tablet (650 mg total) by mouth every 8 (eight) hours as needed for pain. What changed: how much to take   aspirin 325 MG tablet Take 1 tablet (325 mg total) by mouth daily.   calcium-vitamin D 500-200 MG-UNIT tablet Commonly known as: OSCAL WITH D Take 2 tablets by mouth daily with breakfast. Start taking on: December 22, 2018   carvedilol 12.5 MG tablet Commonly known as: COREG Take 6.25 mg by mouth 2 (two) times daily.   donepezil 10 MG tablet Commonly known as: ARICEPT Take 10 mg by mouth at bedtime.   enoxaparin 30 MG/0.3ML injection Commonly known as: Lovenox Inject 0.3 mLs (30 mg total) into the skin daily for 28 days. Continue through last day 12/28 or when advised by Dr. Nyoka Lint to stop   feeding supplement (GLUCERNA SHAKE) Liqd Take 237 mLs by mouth 3 (three) times daily between meals.   HYDROcodone-acetaminophen 5-325 MG tablet Commonly known as: NORCO/VICODIN Take 1 tablet by mouth  every 6 (six) hours as needed for moderate pain or severe pain.   loratadine 10 MG tablet Commonly known as: CLARITIN Take 10 mg by mouth daily.   losartan 25 MG tablet Commonly known as: COZAAR Take 25 mg by mouth daily.   metFORMIN 500 MG tablet Commonly known as: GLUCOPHAGE Take 1 tablet (500 mg total) by mouth 2 (two) times daily.   multivitamin with minerals Tabs tablet Take 1 tablet by mouth daily. Start taking on: December 22, 2018   polyethylene glycol 17 g packet Commonly known as: MIRALAX / GLYCOLAX Take 17 g by mouth daily as needed for mild constipation.   pravastatin 40 MG tablet Commonly known as: PRAVACHOL Take 80 mg by mouth at bedtime.   QUEtiapine 50 MG tablet Commonly known as: SEROquel Take 1 tablet (50 mg total) by mouth at bedtime.   sertraline 25 MG tablet Commonly known as: ZOLOFT Take 25 mg by mouth 2 (two) times daily. Taking 25 mg in the morning and 25 mg (with 50 mg) at bedtime.   traZODone 50 MG tablet Commonly known as: DESYREL Take 50 mg by mouth at bedtime as needed for sleep.      Allergies  Allergen Reactions  . Hydrochlorothiazide W-Triamterene     Other reaction(s): Unknown  . Simvastatin     Other reaction(s): Unknown  . Verapamil     Other reaction(s): Unknown    The results of  significant diagnostics from this hospitalization (including imaging, microbiology, ancillary and laboratory) are listed below for reference.    Significant Diagnostic Studies: Dg Chest 1 View  Result Date: 12/17/2018 CLINICAL DATA:  Preop left hip fracture.  Fall. EXAM: CHEST  1 VIEW COMPARISON:  02/21/2018 FINDINGS: Cardiomegaly. Lungs clear. No effusions. No acute bony abnormality. IMPRESSION: Cardiomegaly.  No active disease. Electronically Signed   By: Charlett NoseKevin  Dover M.D.   On: 12/17/2018 22:19   Ct Head Wo Contrast  Result Date: 12/17/2018 CLINICAL DATA:  Fall EXAM: CT HEAD WITHOUT CONTRAST TECHNIQUE: Contiguous axial images were obtained  from the base of the skull through the vertex without intravenous contrast. COMPARISON:  02/21/2018 FINDINGS: Brain: There is atrophy and chronic small vessel disease changes. No acute intracranial abnormality. Specifically, no hemorrhage, hydrocephalus, mass lesion, acute infarction, or significant intracranial injury. Vascular: No hyperdense vessel or unexpected calcification. Skull: No acute calvarial abnormality. Sinuses/Orbits: Opacified left maxillary sinus and scattered ethmoid air cells. Mucosal thickening in the right maxillary sinus. Other: None IMPRESSION: Atrophy, chronic microvascular disease. No acute intracranial abnormality. Chronic sinusitis. Electronically Signed   By: Charlett NoseKevin  Dover M.D.   On: 12/17/2018 22:21   Dg Hip Port Unilat With Pelvis 1v Left  Result Date: 12/18/2018 CLINICAL DATA:  The patient suffered a left subcapital fracture in a fall 12/17/2018. Status post left hip replacement today. Initial encounter. EXAM: DG HIP (WITH OR WITHOUT PELVIS) 1V PORT LEFT COMPARISON:  Plain films left hip 12/17/2018. FINDINGS: A new bipolar left hip hemiarthroplasty is in place. The device is located. No fracture. Gas in the soft tissues and surgical staples noted. IMPRESSION: Status post left hip replacement.  No acute finding. Electronically Signed   By: Drusilla Kannerhomas  Dalessio M.D.   On: 12/18/2018 14:13   Dg Hip Unilat With Pelvis 2-3 Views Left  Result Date: 12/17/2018 CLINICAL DATA:  Fall.  Left hip pain. EXAM: DG HIP (WITH OR WITHOUT PELVIS) 2-3V LEFT COMPARISON:  None. FINDINGS: There is a left femoral neck fracture with mild impaction and varus angulation. No subluxation or dislocation. IMPRESSION: Left femoral neck fracture with impaction and varus angulation. Electronically Signed   By: Charlett NoseKevin  Dover M.D.   On: 12/17/2018 22:19    Microbiology: Recent Results (from the past 240 hour(s))  SARS CORONAVIRUS 2 (TAT 6-24 HRS) Nasopharyngeal Nasopharyngeal Swab     Status: None   Collection  Time: 12/17/18 11:06 PM   Specimen: Nasopharyngeal Swab  Result Value Ref Range Status   SARS Coronavirus 2 NEGATIVE NEGATIVE Final    Comment: (NOTE) SARS-CoV-2 target nucleic acids are NOT DETECTED. The SARS-CoV-2 RNA is generally detectable in upper and lower respiratory specimens during the acute phase of infection. Negative results do not preclude SARS-CoV-2 infection, do not rule out co-infections with other pathogens, and should not be used as the sole basis for treatment or other patient management decisions. Negative results must be combined with clinical observations, patient history, and epidemiological information. The expected result is Negative. Fact Sheet for Patients: HairSlick.nohttps://www.fda.gov/media/138098/download Fact Sheet for Healthcare Providers: quierodirigir.comhttps://www.fda.gov/media/138095/download This test is not yet approved or cleared by the [place] FDA and  has been authorized for detection and/or diagnosis of SARS-CoV-2 by FDA under an Emergency Use Authorization (EUA). This EUA will remain  in effect (meaning this test can be used) for the duration of the COVID-19 declaration under Section 56 4(b)(1) of the Act, 21 U.S.C. section 360bbb-3(b)(1), unless the authorization is terminated or revoked sooner. Performed at [facility],  [address] [place], [place] [address]   Surgical PCR screen     Status: Abnormal   Collection Time: 12/18/18  2:43 AM   Specimen: Nasal Mucosa; Nasal Swab  Result Value Ref Range Status   MRSA, PCR NEGATIVE NEGATIVE Final   Staphylococcus aureus POSITIVE (A) NEGATIVE Final    Comment: (NOTE) The Xpert SA Assay (FDA approved for NASAL specimens in patients 30 years of age and older), is one component of a comprehensive surveillance program. It is not intended to diagnose infection nor to guide or monitor treatment. Performed at [facility], [address] [place], [place] [address]   SARS CORONAVIRUS 2 (TAT  6-24 HRS) Nasopharyngeal Nasopharyngeal Swab     Status: None   Collection Time: 12/20/18  6:59 PM   Specimen: Nasopharyngeal Swab  Result Value Ref Range Status   SARS Coronavirus 2 NEGATIVE NEGATIVE Final    Comment: (NOTE) SARS-CoV-2 target nucleic acids are NOT DETECTED. The SARS-CoV-2 RNA is generally detectable in upper and lower respiratory specimens during the acute phase of infection. Negative results do not preclude SARS-CoV-2 infection, do not rule out co-infections with other pathogens, and should not be used as the sole basis for treatment or other patient management decisions. Negative results must be combined with clinical observations, patient history, and epidemiological information. The expected result is Negative. Fact Sheet for Patients: HairSlick.no Fact Sheet for Healthcare Providers: quierodirigir.com This test is not yet approved or cleared by the [place] FDA and  has been authorized for detection and/or diagnosis of SARS-CoV-2 by FDA under an Emergency Use Authorization (EUA). This EUA will remain  in effect (meaning this test can be used) for the duration of the COVID-19 declaration under Section 56 4(b)(1) of the Act, 21 U.S.C. section 360bbb-3(b)(1), unless the authorization is terminated or revoked sooner. Performed at [facility], [address], [place], [place] [address]      Labs: Basic Metabolic Panel: Recent Labs  Lab 12/17/18 2118 12/18/18 0500 12/19/18 0434 12/20/18 0423 12/21/18 0433  NA 142 141 139 143 144  K 4.1 3.5 3.7 3.4* 4.2  CL 105 104 104 111 115*  CO2 21* 22  GLUCOSE 119* 137* 158* 129* 133*  BUN CREATININE 1.03* 0.76 1.02* 0.92 0.62  CALCIUM 9.4 9.0 8.4* 8.2* 8.1*   Liver Function Tests: Recent Labs  Lab 12/18/18 0500 12/19/18 0434 12/20/18 0423 12/21/18 0433  AST ALT ALKPHOS 59 50 54 57   BILITOT 0.9 0.7 0.7 0.6  PROT 6.8 5.7* 5.8* 5.6*  ALBUMIN 3.7 2.9* 2.9* 2.6*   CBC: Recent Labs  Lab 12/18/18 0500 12/19/18 0434 12/20/18 0423 12/21/18 0433 12/21/18 1130  WBC 8.0 7.5 7.3 6.2 6.6  HGB 11.2* 9.2* 8.6* 8.0* 8.3*  HCT 36.4 28.8* 27.9* 24.5* 26.6*  MCV 101.4* 96.3 97.2 94.2 98.5  PLT 158 129* 121* 127* 141*   CBG: Recent Labs  Lab 12/20/18 1148 12/20/18 1632 12/20/18 2127 12/21/18 0818 12/21/18 1158  GLUCAP 118* 135* 125* 120* 139*    Principal Problem:   Left displaced femoral neck fracture (HCC) Active Problems:   Stroke (cerebrum) (HCC)   Diabetes mellitus without complication (HCC)   Fall at home, initial encounter   Dementia with behavioral disturbance (HCC)   Acute blood loss anemia   Thrombocytopenia (HCC)   Protein-calorie malnutrition, severe   Time coordinating discharge: 35 minutes  Signed:  Brendia Sacks, MD  [facility]  12/21/2018, 2:31 PM

## 2018-12-23 ENCOUNTER — Telehealth: Payer: Self-pay | Admitting: General Practice

## 2018-12-23 NOTE — Telephone Encounter (Signed)
Negative COVID results given. Patient results "NOT Detected." Caller expressed understanding. Pt's daughter

## 2019-01-02 ENCOUNTER — Emergency Department
Admission: EM | Admit: 2019-01-02 | Discharge: 2019-01-02 | Disposition: A | Discharge status: Home / Self Care | Payer: Medicare Other | Attending: Emergency Medicine | Admitting: Emergency Medicine

## 2019-01-02 ENCOUNTER — Emergency Department: Payer: Medicare Other

## 2019-01-02 ENCOUNTER — Other Ambulatory Visit: Payer: Self-pay

## 2019-01-02 DIAGNOSIS — Z7984 Long term (current) use of oral hypoglycemic drugs: Secondary | ICD-10-CM | POA: Insufficient documentation

## 2019-01-02 DIAGNOSIS — U071 COVID-19: Secondary | ICD-10-CM | POA: Insufficient documentation

## 2019-01-02 DIAGNOSIS — Z7982 Long term (current) use of aspirin: Secondary | ICD-10-CM | POA: Insufficient documentation

## 2019-01-02 DIAGNOSIS — Z79899 Other long term (current) drug therapy: Secondary | ICD-10-CM | POA: Diagnosis not present

## 2019-01-02 DIAGNOSIS — I10 Essential (primary) hypertension: Secondary | ICD-10-CM | POA: Diagnosis not present

## 2019-01-02 DIAGNOSIS — E119 Type 2 diabetes mellitus without complications: Secondary | ICD-10-CM | POA: Diagnosis not present

## 2019-01-02 NOTE — ED Provider Notes (Signed)
[facility] Emergency Department Provider Note   ____________________________________________   First MD Initiated Contact with Patient 01/02/19 1050     (approximate)  I have reviewed the triage vital signs and the nursing notes.   HISTORY  Chief Complaint Abnormal Lab    HPI Stephanie Graham is a 85 y.o. female patient from [facility] recently tested Covid positive.  She has a past history of diabetes and hypertension.  Family insisted on her coming here for treatment.  I discussed her with the hospitalist Dr. Maryellen Pile who regularly works at [facility].  Additionally I reviewed up to date.  There are no approved treatments for her if she is not hypoxic and she is not her pulse ox is running between 97 and 100.  She is not coughing.  She has to be left alone repeatedly.      Past Medical History:  Diagnosis Date  . 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 Diabetes mellitus without complication ([place])   . Fall at home, initial encounter 12/18/2018  . Hypertension     Patient Active Problem List   Diagnosis Date Noted  . Acute blood loss anemia 12/19/2018  . Thrombocytopenia ([place]) 12/19/2018  . Protein-calorie malnutrition, severe 12/19/2018  . Left displaced femoral neck fracture ([place]) 12/18/2018  . Fall at home, initial encounter 12/18/2018  . Dementia with behavioral disturbance ([place]) 12/18/2018  . Hypertension   . Diabetes mellitus without complication ([place])   . 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 Malnutrition of moderate degree 02/24/2018  . Hypothermia 02/21/2018  . 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 Syncope and collapse 01/18/2017  . HTN (hypertension) 01/18/2017  . Diabetes ([place]) 01/18/2017  . Nausea vomiting and diarrhea 01/18/2017    Past Surgical History:  Procedure Laterality Date  . HIP ARTHROPLASTY Left 12/18/2018   Procedure: ARTHROPLASTY BIPOLAR HIP  (HEMIARTHROPLASTY);  Surgeon: Thornton Park, MD;  Location: [facility] ORS;  Service: Orthopedics;  Laterality: Left;  . REPLACEMENT TOTAL KNEE Left     Prior to Admission medications   Medication Sig Start Date End Date Taking? Authorizing Provider  acetaminophen (TYLENOL) 650 MG CR tablet Take 1 tablet (650 mg total) by mouth every 8 (eight) hours as needed for pain. 12/21/18   Samuella Cota, MD  aspirin 325 MG tablet Take 1 tablet (325 mg total) by mouth daily. 01/20/17   Vaughan Basta, MD  calcium-vitamin D (OSCAL WITH D) 500-200 MG-UNIT tablet Take 2 tablets by mouth daily with breakfast. 12/22/18   Samuella Cota, MD  carvedilol (COREG) 12.5 MG tablet Take 6.25 mg by mouth 2 (two) times daily.     [provider]  donepezil (ARICEPT) 10 MG tablet Take 10 mg by mouth at bedtime. 02/06/16   [provider]  enoxaparin (LOVENOX) 30 MG/0.3ML injection Inject 0.3 mLs (30 mg total) into the skin daily for 28 days. Continue through last day 12/28 or when advised by Dr. Nicola Police to stop 12/21/18 01/18/19  Samuella Cota, MD  feeding supplement, GLUCERNA SHAKE, (GLUCERNA SHAKE) LIQD Take 237 mLs by mouth 3 (three) times daily between meals. 12/21/18   Samuella Cota, MD  HYDROcodone-acetaminophen (NORCO/VICODIN) 5-325 MG tablet Take 1 tablet by mouth every 6 (six) hours as needed for moderate pain or severe pain. 12/21/18   Samuella Cota, MD  loratadine (CLARITIN) 10 MG tablet Take 10 mg by mouth daily.    [provider]  losartan (COZAAR) 25 MG tablet Take 25 mg by mouth daily.    [provider]  metFORMIN (GLUCOPHAGE) 500 MG tablet Take 1 tablet (500 mg total) by mouth 2 (two) times daily. 02/24/18   Mayo, Allyn KennerKaty Dodd, MD  Multiple Vitamin (MULTIVITAMIN WITH MINERALS) TABS tablet Take 1 tablet by mouth daily. 12/22/18   Standley BrookingGoodrich, Daniel P, MD  polyethylene glycol (MIRALAX / GLYCOLAX) 17 g packet Take 17 g by mouth daily as needed for mild  constipation. 12/21/18   Standley BrookingGoodrich, Daniel P, MD  pravastatin (PRAVACHOL) 40 MG tablet Take 80 mg by mouth at bedtime. 01/04/16   [provider]  QUEtiapine (SEROQUEL) 50 MG tablet Take 1 tablet (50 mg total) by mouth at bedtime. 10/18/18   Loleta RoseForbach, Cory, MD  sertraline (ZOLOFT) 25 MG tablet Take 25 mg by mouth 2 (two) times daily. Taking 25 mg in the morning and 25 mg (with 50 mg) at bedtime. 12/14/18   [provider]  traZODone (DESYREL) 50 MG tablet Take 50 mg by mouth at bedtime as needed for sleep.    [provider]  divalproex (DEPAKOTE SPRINKLE) 125 MG capsule Take 125-250 mg by mouth 2 (two) times daily.  10/07/18 10/18/18  [provider]  mirtazapine (REMERON) 7.5 MG tablet Take 7.5 mg by mouth at bedtime.  10/14/18 10/18/18  [provider]    Allergies Hydrochlorothiazide w-triamterene, Simvastatin, and Verapamil  History reviewed. No pertinent family history.  Social History Social History   Tobacco Use  . Smoking status: Never Smoker  . Smokeless tobacco: Former Engineer, waterUser  Substance Use Topics  . Alcohol use: No  . Drug use: No    Review of Systems  Constitutional: No fever/chills Eyes: No new visual changes. ENT: No sore throat. Cardiovascular: Denies chest pain. Respiratory: Denies shortness of breath. Gastrointestinal: No abdominal pain.  No nausea, no vomiting.  No diarrhea.  No constipation. Genitourinary: Negative for dysuria. Musculoskeletal: Negative for back pain. Skin: Negative for rash. Neurological: Negative for headaches, focal weakness    ____________________________________________   PHYSICAL EXAM:  VITAL SIGNS: ED Triage Vitals  Enc Vitals Group     BP 01/02/19 1056 (!) 155/68     Pulse Rate 01/02/19 1056 71     Resp 01/02/19 1056 16     Temp 01/02/19 1056 (!) 97.3 F (36.3 C)     Temp Source 01/02/19 1056 Oral     SpO2 01/02/19 1056 97 %     Weight 01/02/19 1100 121 lb 4.1 oz (55 kg)     Height  01/02/19 1100 5\' 2"  (1.575 m)     Head Circumference --      [facility] Flow --      Pain Score --      Pain Loc --      Pain Edu? --      Excl. in GC? --     Constitutional: Alert Well appearing and in no acute distress. Eyes: Conjunctivae are normal.  Head: Atraumatic. Nose: No congestion/rhinnorhea. Mouth/Throat: Mucous membranes are moist.  Oropharynx non-erythematous. Neck: No stridor. Cardiovascular: Normal rate, regular rhythm. Grossly normal heart sounds.  Good peripheral circulation. Respiratory: Normal respiratory effort.  No retractions. Lungs CTAB. Gastrointestinal: Soft and nontender. No distention. No abdominal bruits. No CVA tenderness. Musculoskeletal: No lower extremity tenderness nor edema.  Neurologic:  Normal speech and language.  Patient moving all extremities equally and well Skin:  Skin is warm, dry and intact. No rash noted.   ____________________________________________  LABS (all labs ordered are listed, but only abnormal results are displayed)  Labs Reviewed - No data to display ____________________________________________  EKG   ____________________________________________  RADIOLOGY  ED MD interpretation: I reviewed the patient's chest x-ray I do not see any patchy or focal infiltrates that would be consistent with Covid.  At any rate she is not hypoxic at all.  Official radiology report(s): No results found.  ____________________________________________   PROCEDURES  Procedure(s) performed (including Critical Care):  Procedures   ____________________________________________   INITIAL IMPRESSION / ASSESSMENT AND PLAN / ED COURSE Isley Weisheit was evaluated in Emergency Department on 01/02/2019 for the symptoms described in the history of present illness. She was evaluated in the context of the global COVID-19 pandemic, which necessitated consideration that the patient might be at risk for infection with the SARS-CoV-2 virus that  causes COVID-19. Institutional protocols and algorithms that pertain to the evaluation of patients at risk for COVID-19 are in a state of rapid change based on information released by regulatory bodies including the CDC and federal and state organizations. These policies and algorithms were followed during the patient's care in the ED.       As mentioned in HPI I reviewed the patient's history available in the computer and examined her and checked her vital signs.  She is not hypoxic.  She does not meet any criteria for any outpatient treatment.  Hospitalist states to me studies which show that steroids by themself are not helpful.  Up-to-date mentions not treating anyone with Zithromax or dexamethasone or hydroxychloroquine.  I will send the patient back for careful monitoring.       ____________________________________________   FINAL CLINICAL IMPRESSION(S) / ED DIAGNOSES  Final diagnoses:  Lab test positive for detection of COVID-19 virus     ED Discharge Orders    None       Note:  This document was prepared using Dragon voice recognition software and may include unintentional dictation errors.    Arnaldo Natal, MD 01/02/19 [phone]

## 2019-01-02 NOTE — Discharge Instructions (Addendum)
Her pulse ox is between 97 and 100%.  She is not complaining of any symptoms and wants to be left alone.  Her lungs are clear.  She does not meet any criteria for treatment.  Please monitor her carefully and have her return if she becomes hypoxic.  Use Tylenol for fever.

## 2019-01-02 NOTE — ED Notes (Signed)
[facility] called by Benay Pillow

## 2019-01-02 NOTE — ED Notes (Signed)
Spoke with pt daughter, [name], informed her that pt was evaluated by EDP and that she is being sent back to [facility]. Pt daughter was education on what to expect with COVID, as far as possible fever, pt dtr stated that this is why she wanted patient sent out, because facility told her pt had temp of 101. RN explained that pt was afebrile when she arrived to ED and that SpO2 was 97% on room air. Pt daughter verbalized understanding.

## 2019-01-02 NOTE — ED Triage Notes (Signed)
Pt arrived via EMS from [facility] d/t testing positive for Covid-19. Pt VS are stable at this time. Pt has dementia and is at baseline at this time.

## 2019-01-02 NOTE — ED Notes (Signed)
Pt dc'd by EMS to [facility].

## 2019-01-02 NOTE — ED Notes (Signed)
This RN called and spoke with [place] at [facility]. Informed her that the pt had been evaluated by EDP and is being discharged back to facility.

## 2019-01-20 ENCOUNTER — Other Ambulatory Visit: Payer: Self-pay

## 2019-01-20 ENCOUNTER — Emergency Department
Admission: EM | Admit: 2019-01-20 | Discharge: 2019-01-20 | Disposition: A | Discharge status: Home / Self Care | Payer: Medicare Other | Attending: Emergency Medicine | Admitting: Emergency Medicine

## 2019-01-20 DIAGNOSIS — Z7984 Long term (current) use of oral hypoglycemic drugs: Secondary | ICD-10-CM | POA: Insufficient documentation

## 2019-01-20 DIAGNOSIS — I10 Essential (primary) hypertension: Secondary | ICD-10-CM | POA: Insufficient documentation

## 2019-01-20 DIAGNOSIS — R799 Abnormal finding of blood chemistry, unspecified: Secondary | ICD-10-CM | POA: Diagnosis present

## 2019-01-20 DIAGNOSIS — Z7982 Long term (current) use of aspirin: Secondary | ICD-10-CM | POA: Insufficient documentation

## 2019-01-20 DIAGNOSIS — F0390 Unspecified dementia without behavioral disturbance: Secondary | ICD-10-CM | POA: Insufficient documentation

## 2019-01-20 DIAGNOSIS — Z87891 Personal history of nicotine dependence: Secondary | ICD-10-CM | POA: Insufficient documentation

## 2019-01-20 DIAGNOSIS — Z8673 Personal history of transient ischemic attack (TIA), and cerebral infarction without residual deficits: Secondary | ICD-10-CM | POA: Insufficient documentation

## 2019-01-20 DIAGNOSIS — E119 Type 2 diabetes mellitus without complications: Secondary | ICD-10-CM

## 2019-01-20 DIAGNOSIS — N179 Acute kidney failure, unspecified: Secondary | ICD-10-CM | POA: Diagnosis not present

## 2019-01-20 DIAGNOSIS — E870 Hyperosmolality and hypernatremia: Secondary | ICD-10-CM | POA: Insufficient documentation

## 2019-01-20 DIAGNOSIS — F03C0 Unspecified dementia, severe, without behavioral disturbance, psychotic disturbance, mood disturbance, and anxiety: Secondary | ICD-10-CM

## 2019-01-20 DIAGNOSIS — E860 Dehydration: Secondary | ICD-10-CM | POA: Diagnosis not present

## 2019-01-20 DIAGNOSIS — Z79899 Other long term (current) drug therapy: Secondary | ICD-10-CM | POA: Insufficient documentation

## 2019-01-20 LAB — BASIC METABOLIC PANEL
Anion gap: 14 (ref 5–15)
BUN: 48 mg/dL — ABNORMAL HIGH (ref 8–23)
CO2: 29 mmol/L (ref 22–32)
Calcium: 9.4 mg/dL (ref 8.9–10.3)
Chloride: 116 mmol/L — ABNORMAL HIGH (ref 98–111)
Creatinine, Ser: 1.95 mg/dL — ABNORMAL HIGH (ref 0.44–1.00)
GFR calc Af Amer: 25 mL/min — ABNORMAL LOW (ref >60)
GFR calc non Af Amer: 21 mL/min — ABNORMAL LOW (ref >60)
Glucose, Bld: 128 mg/dL — ABNORMAL HIGH (ref 70–99)
Potassium: 4.2 mmol/L (ref 3.5–5.1)
Sodium: 159 mmol/L — ABNORMAL HIGH (ref 135–145)

## 2019-01-20 LAB — CBC WITH DIFFERENTIAL/PLATELET
Abs Immature Granulocytes: 0.04 10*3/uL (ref 0.00–0.07)
Basophils Absolute: 0 10*3/uL (ref 0.0–0.1)
Basophils Relative: 0 %
Eosinophils Absolute: 0.1 10*3/uL (ref 0.0–0.5)
Eosinophils Relative: 1 %
HCT: 37.8 % (ref 36.0–46.0)
Hemoglobin: 10.7 g/dL — ABNORMAL LOW (ref 12.0–15.0)
Immature Granulocytes: 1 %
Lymphocytes Relative: 17 %
Lymphs Abs: 1.2 10*3/uL (ref 0.7–4.0)
MCH: 28.5 pg (ref 26.0–34.0)
MCHC: 28.3 g/dL — ABNORMAL LOW (ref 30.0–36.0)
MCV: 100.8 fL — ABNORMAL HIGH (ref 80.0–100.0)
Monocytes Absolute: 0.4 10*3/uL (ref 0.1–1.0)
Monocytes Relative: 6 %
Neutro Abs: 5.2 10*3/uL (ref 1.7–7.7)
Neutrophils Relative %: 75 %
Platelets: 263 10*3/uL (ref 150–400)
RBC: 3.75 MIL/uL — ABNORMAL LOW (ref 3.87–5.11)
RDW: 17.3 % — ABNORMAL HIGH (ref 11.5–15.5)
WBC: 6.9 10*3/uL (ref 4.0–10.5)
nRBC: 0 % (ref 0.0–0.2)

## 2019-01-20 MED ORDER — SODIUM CHLORIDE 0.9 % IV BOLUS
500.0000 mL | Freq: Once | INTRAVENOUS | Status: AC
  Administered 2019-01-20: 500 mL via INTRAVENOUS

## 2019-01-20 NOTE — ED Notes (Signed)
Spoke with pt's daughter [name] and updated her on pt's status. Pt spoke with daughter on phone. Pt then given applesauce. Pt ate 2 bites of applesauce. Pt offered ice cream. Pt taken orange sherbet ice cream. Pt ate several bites and then asked what time it was. Pt informed of time and stated ok she was going to go to bed now. Pt repositioned in bed and now resting.

## 2019-01-20 NOTE — ED Provider Notes (Signed)
[facility] Emergency Department Provider Note  ____________________________________________  Time seen: Approximately 8:13 PM  I have reviewed the triage vital signs and the nursing notes.   HISTORY  Chief Complaint Abnormal Lab    Level 5 Caveat: Portions of the History and Physical including HPI and review of systems are unable to be completely obtained due to patient being a poor historian   HPI Stephanie Graham is a 85 y.o. female with a history of diabetes, hypertension, stroke, dementia, and recently diagnosed with COVID-19 on December 13 who was sent to the ED from [facility] for evaluation of elevated creatinine.  Baseline labs on 01/12/2019 showed creatinine of 0.67, hemoglobin of 9.1, normal urinalysis.  Labs today at [facility] showed creatinine of 2.0, hemoglobin of 10.5, unremarkable urinalysis.   By EMS report, [facility] was going to treat this with hydration but family requested the patient be brought to the emergency department for treatment instead.  Patient denies any complaints.  Says that she is eating normally, denies pain or shortness of breath or nausea or vomiting.     Past Medical History:  Diagnosis Date  . 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 Diabetes mellitus without complication (HCC)   . Fall at home, initial encounter 12/18/2018  . Hypertension      Patient Active Problem List   Diagnosis Date Noted  . Acute blood loss anemia 12/19/2018  . Thrombocytopenia (HCC) 12/19/2018  . Protein-calorie malnutrition, severe 12/19/2018  . Left displaced femoral neck fracture (HCC) 12/18/2018  . Fall at home, initial encounter 12/18/2018  . Dementia with behavioral disturbance (HCC) 12/18/2018  . Hypertension   . Diabetes mellitus without complication (HCC)   . 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 Malnutrition of moderate degree 02/24/2018  . Hypothermia  02/21/2018  . 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 Syncope and collapse 01/18/2017  . HTN (hypertension) 01/18/2017  . Diabetes (HCC) 01/18/2017  . Nausea vomiting and diarrhea 01/18/2017     Past Surgical History:  Procedure Laterality Date  . HIP ARTHROPLASTY Left 12/18/2018   Procedure: ARTHROPLASTY BIPOLAR HIP (HEMIARTHROPLASTY);  Surgeon: Juanell Fairly, MD;  Location: [facility] ORS;  Service: Orthopedics;  Laterality: Left;  . REPLACEMENT TOTAL KNEE Left      Prior to Admission medications   Medication Sig Start Date End Date Taking? Authorizing Provider  acetaminophen (TYLENOL) 650 MG CR tablet Take 1 tablet (650 mg total) by mouth every 8 (eight) hours as needed for pain. 12/21/18   Standley Brooking, MD  aspirin 325 MG tablet Take 1 tablet (325 mg total) by mouth daily. 01/20/17   Altamese Dilling, MD  calcium-vitamin D (OSCAL WITH D) 500-200 MG-UNIT tablet Take 2 tablets by mouth daily with breakfast. 12/22/18   Standley Brooking, MD  carvedilol (COREG) 12.5 MG tablet Take 6.25 mg by mouth 2 (two) times daily.     [provider]  donepezil (ARICEPT) 10 MG tablet Take 10 mg by mouth at bedtime. 02/06/16   [provider]  enoxaparin (LOVENOX) 30 MG/0.3ML injection Inject 0.3 mLs (30 mg total) into the skin daily for 28 days. Continue through last day 12/28 or when advised by Dr. Nyoka Lint to stop 12/21/18 01/18/19  Standley Brooking, MD  feeding supplement, GLUCERNA SHAKE, (GLUCERNA SHAKE) LIQD Take 237 mLs by mouth 3 (three) times daily between meals. 12/21/18   Standley Brooking, MD  HYDROcodone-acetaminophen (NORCO/VICODIN) [phone]  MG tablet Take 1 tablet by mouth every 6 (six) hours as needed for moderate pain or severe pain. 12/21/18   Standley Brooking, MD  loratadine (CLARITIN) 10 MG tablet Take 10 mg by mouth daily.    [provider]  losartan (COZAAR) 25 MG tablet Take 25 mg by mouth daily.    [provider]  metFORMIN (GLUCOPHAGE)  500 MG tablet Take 1 tablet (500 mg total) by mouth 2 (two) times daily. 02/24/18   Mayo, Allyn Kenner, MD  Multiple Vitamin (MULTIVITAMIN WITH MINERALS) TABS tablet Take 1 tablet by mouth daily. 12/22/18   Standley Brooking, MD  polyethylene glycol (MIRALAX / GLYCOLAX) 17 g packet Take 17 g by mouth daily as needed for mild constipation. 12/21/18   Standley Brooking, MD  pravastatin (PRAVACHOL) 40 MG tablet Take 80 mg by mouth at bedtime. 01/04/16   [provider]  QUEtiapine (SEROQUEL) 50 MG tablet Take 1 tablet (50 mg total) by mouth at bedtime. 10/18/18   Loleta Rose, MD  sertraline (ZOLOFT) 25 MG tablet Take 25 mg by mouth 2 (two) times daily. Taking 25 mg in the morning and 25 mg (with 50 mg) at bedtime. 12/14/18   [provider]  traZODone (DESYREL) 50 MG tablet Take 50 mg by mouth at bedtime as needed for sleep.    [provider]  divalproex (DEPAKOTE SPRINKLE) 125 MG capsule Take 125-250 mg by mouth 2 (two) times daily.  10/07/18 10/18/18  [provider]  mirtazapine (REMERON) 7.5 MG tablet Take 7.5 mg by mouth at bedtime.  10/14/18 10/18/18  [provider]     Allergies Hydrochlorothiazide w-triamterene, Simvastatin, and Verapamil   History reviewed. No pertinent family history.  Social History Social History   Tobacco Use  . Smoking status: Never Smoker  . Smokeless tobacco: Former Engineer, water Use Topics  . Alcohol use: No  . Drug use: No    Review of Systems Level 5 Caveat: Portions of the History and Physical including HPI and review of systems are unable to be completely obtained due to patient being a poor historian   Constitutional:   No known fever.  ENT:   No rhinorrhea. Cardiovascular:   No chest pain or syncope. Respiratory:   No dyspnea or cough. Gastrointestinal:   Negative for abdominal pain, vomiting and diarrhea.  Musculoskeletal:   Negative for focal pain or  swelling ____________________________________________   PHYSICAL EXAM:  VITAL SIGNS: ED Triage Vitals  Enc Vitals Group     BP 01/20/19 1956 (!) 141/71     Pulse Rate 01/20/19 1952 (!) 56     Resp 01/20/19 1952 16     Temp 01/20/19 1952 98.1 F (36.7 C)     Temp Source 01/20/19 1952 Oral     SpO2 01/20/19 1957 100 %     Weight 01/20/19 1950 121 lb 4.1 oz (55 kg)     Height 01/20/19 1950 5\' 2"  (1.575 m)     Head Circumference --      [facility] Flow --      Pain Score --      Pain Loc --      Pain Edu? --      Excl. in GC? --     Vital signs reviewed, nursing assessments reviewed.   Constitutional:   Alert and oriented to self. Non-toxic appearance. Eyes:   Conjunctivae are normal. EOMI. PERRL. ENT      Head:   Normocephalic and atraumatic.  Nose:   No congestion/rhinnorhea.       Mouth/Throat:   Dry mucous membranes, no pharyngeal erythema. No peritonsillar mass.       Neck:   No meningismus. Full ROM. Hematological/Lymphatic/Immunilogical:   No cervical lymphadenopathy. Cardiovascular:   RRR. Symmetric bilateral radial and DP pulses.  No murmurs. Cap refill less than 2 seconds. Respiratory:   Normal respiratory effort without tachypnea/retractions. Breath sounds are clear and equal bilaterally. No wheezes/rales/rhonchi. Gastrointestinal:   Soft and nontender. Non distended. There is no CVA tenderness.  No rebound, rigidity, or guarding. Musculoskeletal:   Normal range of motion in all extremities. No joint effusions.  No lower extremity tenderness.  No edema. Neurologic:   Normal speech, limited language range.  Motor grossly intact. No acute focal neurologic deficits are appreciated.  Skin:    Skin is warm, dry and intact. No rash noted.  No petechiae, purpura, or bullae.  ____________________________________________    LABS (pertinent positives/negatives) (all labs ordered are listed, but only abnormal results are displayed) Labs Reviewed  BASIC METABOLIC PANEL -  Abnormal; Notable for the following components:      Result Value   Sodium 159 (*)    Chloride 116 (*)    Glucose, Bld 128 (*)    BUN 48 (*)    Creatinine, Ser 1.95 (*)    GFR calc non Af Amer 21 (*)    GFR calc Af Amer 25 (*)    All other components within normal limits  CBC WITH DIFFERENTIAL/PLATELET - Abnormal; Notable for the following components:   RBC 3.75 (*)    Hemoglobin 10.7 (*)    MCV 100.8 (*)    MCHC 28.3 (*)    RDW 17.3 (*)    All other components within normal limits   ____________________________________________   EKG  Interpreted by me Sinus rhythm rate of 65, normal axis.  Poor R wave progression.  Left bundle branch block.  No acute ischemic changes.  ____________________________________________    RADIOLOGY  No results found.  ____________________________________________   PROCEDURES Procedures  ____________________________________________    CLINICAL IMPRESSION / ASSESSMENT AND PLAN / ED COURSE  Medications ordered in the ED: Medications  sodium chloride 0.9 % bolus 500 mL (500 mLs Intravenous New Bag/Given 01/20/19 2015)    Pertinent labs & imaging results that were available during my care of the patient were reviewed by me and considered in my medical decision making (see chart for details).   Stephanie Graham was evaluated in Emergency Department on 01/20/2019 for the symptoms described in the history of present illness. She was evaluated in the context of the global COVID-19 pandemic, which necessitated consideration that the patient might be at risk for infection with the SARS-CoV-2 virus that causes COVID-19. Institutional protocols and algorithms that pertain to the evaluation of patients at risk for COVID-19 are in a state of rapid change based on information released by regulatory bodies including the CDC and federal and state organizations. These policies and algorithms were followed during the patient's care in the ED.      Clinical Course as of Jan 19 2246  Thu Jan 20, 2019  2011 Patient sent to the ED for evaluation of elevated creatinine.  Outpatient labs obtained by [facility] shows that on December 23 creatinine was 0.67, hemoglobin was 9 and urinalysis was unremarkable.  On December 31, creatinine was 2.0 without electrolyte abnormalities, hemoglobin was 10.5 (likely due to hemoconcentration) and urinalysis shows concentration but no signs of infection.  I will repeat the metabolic panel and CBC here to confirm.  I will give IV fluids for hydration as the patient is clinically dehydrated.   [PS]  2242 Patient feeling much better.  Able to converse normally with her family on the phone.  States she is ready to go to bed.  Documented pulse rate of 31 is erroneous, as patient's heart rate is remained steady around 60.   [PS]  2243 Labs show sodium level of 159.  This should improve now that the patient has had better hydration.  This can continue to be monitored at [facility].   [PS]    Clinical Course User Index [PS] Sharman CheekStafford, Tyaisha Cullom, MD     ____________________________________________   FINAL CLINICAL IMPRESSION(S) / ED DIAGNOSES    Final diagnoses:  Dehydration  Advanced dementia (HCC)  Type 2 diabetes mellitus without complication, without long-term current use of insulin (HCC)  AKI (acute kidney injury) (HCC)  Hypernatremia   ED Discharge Orders    None      Portions of this note were generated with dragon dictation software. Dictation errors may occur despite best attempts at proofreading.   Sharman CheekStafford, Shearon Clonch, MD 01/20/19 [phone]

## 2019-01-20 NOTE — Discharge Instructions (Addendum)
Your evaluation today showed dehydration resulting in a high blood sodium level.  Please increase intake of plain water in addition to your usual nutritional supplements and diet.  Please follow-up with your doctor and have your lab tests rechecked in the next 2 days.

## 2019-01-20 NOTE — ED Notes (Signed)
EDP at bedside  

## 2019-01-20 NOTE — ED Notes (Signed)
Unable to obtain e-signature d/t pt's history of dementia.

## 2019-01-20 NOTE — ED Triage Notes (Signed)
Pt arrived via E[facility] from [facility].  Per EMS pt's family requested pt be bought to the ER as they did not want [facility] treating pt for her elevated creatinine of 2.03, and decreased RBC count.  Pt just out of quarintine for covid.

## 2019-01-20 NOTE — ED Notes (Signed)
Pt placed in clean brief and repositioned.

## 2019-01-20 NOTE — ED Notes (Signed)
Paperwork given to Network engineer.

## 2019-01-20 NOTE — ED Notes (Signed)
Pt groans with stimulus.

## 2019-01-29 ENCOUNTER — Emergency Department: Payer: Medicare Other

## 2019-01-29 ENCOUNTER — Other Ambulatory Visit: Payer: Self-pay

## 2019-01-29 ENCOUNTER — Emergency Department
Admission: EM | Admit: 2019-01-29 | Discharge: 2019-01-29 | Disposition: A | Discharge status: Home / Self Care | Payer: Medicare Other | Attending: Student | Admitting: Student

## 2019-01-29 DIAGNOSIS — I10 Essential (primary) hypertension: Secondary | ICD-10-CM | POA: Diagnosis not present

## 2019-01-29 DIAGNOSIS — W19XXXA Unspecified fall, initial encounter: Secondary | ICD-10-CM

## 2019-01-29 DIAGNOSIS — Y92099 Unspecified place in other non-institutional residence as the place of occurrence of the external cause: Secondary | ICD-10-CM | POA: Diagnosis not present

## 2019-01-29 DIAGNOSIS — Z7982 Long term (current) use of aspirin: Secondary | ICD-10-CM | POA: Insufficient documentation

## 2019-01-29 DIAGNOSIS — Z8673 Personal history of transient ischemic attack (TIA), and cerebral infarction without residual deficits: Secondary | ICD-10-CM | POA: Diagnosis not present

## 2019-01-29 DIAGNOSIS — E119 Type 2 diabetes mellitus without complications: Secondary | ICD-10-CM | POA: Insufficient documentation

## 2019-01-29 DIAGNOSIS — Z79899 Other long term (current) drug therapy: Secondary | ICD-10-CM | POA: Insufficient documentation

## 2019-01-29 DIAGNOSIS — S0990XA Unspecified injury of head, initial encounter: Secondary | ICD-10-CM | POA: Diagnosis present

## 2019-01-29 DIAGNOSIS — Y9389 Activity, other specified: Secondary | ICD-10-CM | POA: Insufficient documentation

## 2019-01-29 DIAGNOSIS — Y999 Unspecified external cause status: Secondary | ICD-10-CM | POA: Diagnosis not present

## 2019-01-29 DIAGNOSIS — W06XXXA Fall from bed, initial encounter: Secondary | ICD-10-CM | POA: Diagnosis not present

## 2019-01-29 DIAGNOSIS — Z7984 Long term (current) use of oral hypoglycemic drugs: Secondary | ICD-10-CM | POA: Insufficient documentation

## 2019-01-29 DIAGNOSIS — S0083XA Contusion of other part of head, initial encounter: Secondary | ICD-10-CM | POA: Diagnosis not present

## 2019-01-29 NOTE — Discharge Instructions (Addendum)
Thank you for letting us take care of you in the emergency department today.   The CT scan of your head and neck did not show any new acute injuries. The x-ray of your left hip did not show any fractures, your surgery hardware appears well.  Please continue to take any regular, prescribed medications.   Please follow up with your primary care doctor, as needed, to review your ER visit and follow up on your symptoms.   Please return to the ER for any new or worsening symptoms.

## 2019-01-29 NOTE — ED Provider Notes (Signed)
[facility] Emergency Department Provider Note  ____________________________________________   First MD Initiated Contact with Patient 01/29/19 2105     (approximate)  I have reviewed the triage vital signs and the nursing notes.  History  Chief Complaint Fall    HPI Kimbria Camposano is a 86 y.o. female with history of DM, HTN, dementia who presents to the emergency department for a fall at home.  Patient lives at home with family, who have a bedroom monitor in place.  They witnessed on the monitor the patient attempting to get out of bed, and accidentally rolling over the guard rails, almost in a somersault-like fashion.  Falling forward and hitting her head.  She does have a resultant hematoma to the left side of her head.  On arrival to the emergency department, the patient has no acute complaints.  She is pleasantly demented.  Denies any pain.  Caveat: History primarily obtained from EMS, given patient's history of dementia.   Past Medical Hx Past Medical History:  Diagnosis Date  . 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 Diabetes mellitus without complication ([place])   . Fall at home, initial encounter 12/18/2018  . Hypertension     Problem List Patient Active Problem List   Diagnosis Date Noted  . Acute blood loss anemia 12/19/2018  . Thrombocytopenia ([place]) 12/19/2018  . Protein-calorie malnutrition, severe 12/19/2018  . Left displaced femoral neck fracture ([place]) 12/18/2018  . Fall at home, initial encounter 12/18/2018  . Dementia with behavioral disturbance ([place]) 12/18/2018  . Hypertension   . Diabetes mellitus without complication ([place])   . 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 Malnutrition of moderate degree 02/24/2018  . Hypothermia 02/21/2018  . 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 Syncope and collapse 01/18/2017  . HTN (hypertension) 01/18/2017  . Diabetes ([place]) 01/18/2017   . Nausea vomiting and diarrhea 01/18/2017    Past Surgical Hx Past Surgical History:  Procedure Laterality Date  . HIP ARTHROPLASTY Left 12/18/2018   Procedure: ARTHROPLASTY BIPOLAR HIP (HEMIARTHROPLASTY);  Surgeon: Thornton Park, MD;  Location: [facility] ORS;  Service: Orthopedics;  Laterality: Left;  . REPLACEMENT TOTAL KNEE Left     Medications Prior to Admission medications   Medication Sig Start Date End Date Taking? Authorizing Provider  acetaminophen (TYLENOL) 650 MG CR tablet Take 1 tablet (650 mg total) by mouth every 8 (eight) hours as needed for pain. 12/21/18   Samuella Cota, MD  aspirin 325 MG tablet Take 1 tablet (325 mg total) by mouth daily. 01/20/17   Vaughan Basta, MD  calcium-vitamin D (OSCAL WITH D) 500-200 MG-UNIT tablet Take 2 tablets by mouth daily with breakfast. 12/22/18   Samuella Cota, MD  carvedilol (COREG) 12.5 MG tablet Take 6.25 mg by mouth 2 (two) times daily.     [provider]  donepezil (ARICEPT) 10 MG tablet Take 10 mg by mouth at bedtime. 02/06/16   [provider]  enoxaparin (LOVENOX) 30 MG/0.3ML injection Inject 0.3 mLs (30 mg total) into the skin daily for 28 days. Continue through last day 12/28 or when advised by Dr. Nicola Police to stop 12/21/18 01/18/19  Samuella Cota, MD  feeding supplement, GLUCERNA SHAKE, (GLUCERNA SHAKE) LIQD Take 237 mLs by mouth 3 (three) times daily between meals. 12/21/18   Samuella Cota, MD  HYDROcodone-acetaminophen (NORCO/VICODIN) 5-325 MG tablet Take 1 tablet by mouth every 6 (six) hours as needed for moderate  pain or severe pain. 12/21/18   Standley Brooking, MD  loratadine (CLARITIN) 10 MG tablet Take 10 mg by mouth daily.    [provider]  losartan (COZAAR) 25 MG tablet Take 25 mg by mouth daily.    [provider]  metFORMIN (GLUCOPHAGE) 500 MG tablet Take 1 tablet (500 mg total) by mouth 2 (two) times daily. 02/24/18   Mayo, Allyn Kenner, MD  Multiple Vitamin  (MULTIVITAMIN WITH MINERALS) TABS tablet Take 1 tablet by mouth daily. 12/22/18   Standley Brooking, MD  polyethylene glycol (MIRALAX / GLYCOLAX) 17 g packet Take 17 g by mouth daily as needed for mild constipation. 12/21/18   Standley Brooking, MD  pravastatin (PRAVACHOL) 40 MG tablet Take 80 mg by mouth at bedtime. 01/04/16   [provider]  QUEtiapine (SEROQUEL) 50 MG tablet Take 1 tablet (50 mg total) by mouth at bedtime. 10/18/18   Loleta Rose, MD  sertraline (ZOLOFT) 25 MG tablet Take 25 mg by mouth 2 (two) times daily. Taking 25 mg in the morning and 25 mg (with 50 mg) at bedtime. 12/14/18   [provider]  traZODone (DESYREL) 50 MG tablet Take 50 mg by mouth at bedtime as needed for sleep.    [provider]  divalproex (DEPAKOTE SPRINKLE) 125 MG capsule Take 125-250 mg by mouth 2 (two) times daily.  10/07/18 10/18/18  [provider]  mirtazapine (REMERON) 7.5 MG tablet Take 7.5 mg by mouth at bedtime.  10/14/18 10/18/18  [provider]    Allergies Hydrochlorothiazide w-triamterene, Simvastatin, and Verapamil  Family Hx No family history on file.  Social Hx Social History   Tobacco Use  . Smoking status: Never Smoker  . Smokeless tobacco: Former Engineer, water Use Topics  . Alcohol use: No  . Drug use: No     Review of Systems Constitutional: Negative for fever, chills. Eyes: Negative for visual changes. ENT: Negative for sore throat. Cardiovascular: Negative for chest pain. Respiratory: Negative for shortness of breath. Gastrointestinal: Negative for nausea, vomiting.  Genitourinary: Negative for dysuria. Musculoskeletal: Negative for leg swelling. Skin: + Hematoma to the left forehead Neurological: Negative for for headaches.   Physical Exam  Vital Signs: ED Triage Vitals [01/29/19 2104]  Enc Vitals Group     BP      Pulse      Resp      Temp      Temp src      SpO2      Weight 121 lb 4.1 oz (55 kg)      Height 5\' 2"  (1.575 m)     Head Circumference      Peak Flow      Pain Score      Pain Loc      Pain Edu?      Excl. in [place]?     Constitutional: Awake and alert.  Pleasantly demented. Head: Normocephalic.  Hematoma to the left forehead. Eyes: Conjunctivae clear. Sclera anicteric.  Baseline blindness of right eye. Nose: No congestion. No rhinorrhea. Mouth/Throat: Wearing mask.  Neck: No stridor.  No midline C-spine tenderness.  Full range of motion. Cardiovascular: Normal rate, regular rhythm. Extremities well perfused. Respiratory: Normal respiratory effort.  Lungs CTAB.  Chest wall stable, nontender. Gastrointestinal: Soft. Non-tender. Non-distended.  Pelvis: Pelvis stable, nontender with AP and lateral compression.  Full range of motion to bilateral hips. Musculoskeletal: No lower extremity edema. No deformities.  FROM at bilateral shoulders, elbows, wrists, hips,  knees, ankles. Neurologic: Pleasantly demented. No gross focal neurologic deficits are appreciated.  Skin: Hematoma as above, no other lacerations, abrasions, or breaks in skin. Psychiatric: Pleasantly demented.  EKG  Personally reviewed.   Rate: 74 Rhythm: Sinus Axis: LAD Intervals: Wide QRS secondary to LBBB Significant baseline wander, but no gross acute ischemic changes appreciated Negative Sgarbossa criteria No STEMI    Radiology  CT head/CS:  IMPRESSION:  1. No acute intracranial abnormality.  2. Findings consistent with age related atrophy and chronic small  vessel ischemia  3. Soft tissue swelling seen over the left frontal skull  4. No acute fracture or malalignment of the spine.  5. Chronic sinusitis of the left maxillary sinus and ethmoid air  cells.  6. Cervical spine spondylosis most notable at C3-C4 with moderate to  severe neural foraminal and central canal stenosis.   XR hip:  IMPRESSION:  Status post left total hip arthroplasty without acute complication.     Procedures  Procedure(s) performed (including critical care):  Procedures   Initial Impression / Assessment and Plan / ED Course  86 y.o. female who presents to the ED for fall, as above.  Will obtain CT head, C-spine to rule out any acute traumatic injuries.  Will obtain an x-ray left hip.  Although she is nontender on exam, she did have a recent hemiarthroplasty at the end of November, will XR to ensure no compromise given her recent surgery.  XR hip w/o acute complication.  CT head and C-spine without acute intracranial abnormality or fracture/malalignment of the spine, respectively.  Given negative work-up, patient stable for discharge. Given return precautions.    Final Clinical Impression(s) / ED Diagnosis  Final diagnoses:  Fall, initial encounter  Traumatic hematoma of forehead, initial encounter       Note:  This document was prepared using Dragon voice recognition software and may include unintentional dictation errors.   Miguel Aschoff., MD 01/29/19 2217

## 2019-01-29 NOTE — ED Triage Notes (Signed)
Pt arrives [facility] from home. Family reports pt did a front roll off the foot of the bed over the rail. Pt has hematoma over L eye. Pt denies pain.   Hx dementia and hip surgery.  BP 183/88 P 78 O2 96% room air

## 2019-01-29 NOTE — ED Notes (Signed)
Patient transported to X-ray 

## 2019-03-04 ENCOUNTER — Telehealth: Payer: Self-pay | Admitting: Nurse Practitioner

## 2019-03-04 NOTE — Telephone Encounter (Signed)
Spoke with daughter Para March regarding Palliative services and all questions were answered and she was in agreement with this.  I have scheduled a Telephone Consult for 03/31/19 @ 10 AM.

## 2019-03-06 ENCOUNTER — Other Ambulatory Visit: Payer: Self-pay

## 2019-03-06 ENCOUNTER — Emergency Department
Admission: EM | Admit: 2019-03-06 | Discharge: 2019-03-07 | Disposition: A | Discharge status: Home / Self Care | Payer: Medicare Other | Attending: Emergency Medicine | Admitting: Emergency Medicine

## 2019-03-06 ENCOUNTER — Emergency Department: Payer: Medicare Other

## 2019-03-06 DIAGNOSIS — I10 Essential (primary) hypertension: Secondary | ICD-10-CM | POA: Insufficient documentation

## 2019-03-06 DIAGNOSIS — Y9389 Activity, other specified: Secondary | ICD-10-CM | POA: Diagnosis not present

## 2019-03-06 DIAGNOSIS — E119 Type 2 diabetes mellitus without complications: Secondary | ICD-10-CM | POA: Insufficient documentation

## 2019-03-06 DIAGNOSIS — Z79899 Other long term (current) drug therapy: Secondary | ICD-10-CM | POA: Insufficient documentation

## 2019-03-06 DIAGNOSIS — W1811XA Fall from or off toilet without subsequent striking against object, initial encounter: Secondary | ICD-10-CM | POA: Insufficient documentation

## 2019-03-06 DIAGNOSIS — M545 Low back pain: Secondary | ICD-10-CM | POA: Insufficient documentation

## 2019-03-06 DIAGNOSIS — Y999 Unspecified external cause status: Secondary | ICD-10-CM | POA: Insufficient documentation

## 2019-03-06 DIAGNOSIS — Z7984 Long term (current) use of oral hypoglycemic drugs: Secondary | ICD-10-CM | POA: Insufficient documentation

## 2019-03-06 DIAGNOSIS — F0390 Unspecified dementia without behavioral disturbance: Secondary | ICD-10-CM | POA: Insufficient documentation

## 2019-03-06 DIAGNOSIS — W19XXXA Unspecified fall, initial encounter: Secondary | ICD-10-CM

## 2019-03-06 DIAGNOSIS — Y92099 Unspecified place in other non-institutional residence as the place of occurrence of the external cause: Secondary | ICD-10-CM | POA: Insufficient documentation

## 2019-03-06 LAB — URINALYSIS, COMPLETE (UACMP) WITH MICROSCOPIC
Bacteria, UA: NONE SEEN
Bilirubin Urine: NEGATIVE
Glucose, UA: NEGATIVE mg/dL
Hgb urine dipstick: NEGATIVE
Ketones, ur: NEGATIVE mg/dL
Leukocytes,Ua: NEGATIVE
Nitrite: NEGATIVE
Protein, ur: NEGATIVE mg/dL
Specific Gravity, Urine: 1.003 — ABNORMAL LOW (ref 1.005–1.030)
pH: 7 (ref 5.0–8.0)

## 2019-03-06 NOTE — ED Triage Notes (Addendum)
Pt arrives to ED via [facility] from home s/p unwitnessed fall while using the bathroom. Pt denies any c/o lightheadedness or dizziness prior to falling; states her left leg "just gave out". Pt denies any LOC, no c/o neck or back pain. Per EMS, pt does have a h/x of Alzheimer's and Dementia, but is alert and able to answer all assessment questions appropriately. Note: EMS states pt takes Aspirin daily, but no other anticoagulants.

## 2019-03-06 NOTE — ED Notes (Signed)
Yellow High Fall Risk bracelet placed on pt's left wrist; yellow slip-resistant socks applied and bed alarm activated at this time.

## 2019-03-06 NOTE — ED Provider Notes (Signed)
[facility] Emergency Department Provider Note  Time seen: 11:36 PM  I have reviewed the triage vital signs and the nursing notes.   HISTORY  Chief Complaint Fall   HPI Stephanie Graham is a 86 y.o. female with a past medical history of diabetes, hypertension, mild dementia, presents to the emergency department after a fall.  According to EMS patient lives at home with family, was attempting to use a bedside commode when she slipped off the commode onto her buttocks.  Patient states mild soreness in her buttocks otherwise no complaints.  Great range of motion all extremities, denies any neck or back pain.  No headache.  Denies hitting her head or LOC.  No apparent anticoagulation besides aspirin.   Past Medical History:  Diagnosis Date  . 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 Diabetes mellitus without complication ([facility])   . Fall at home, initial encounter 12/18/2018  . Hypertension     Patient Active Problem List   Diagnosis Date Noted  . Acute blood loss anemia 12/19/2018  . Thrombocytopenia ([facility]) 12/19/2018  . Protein-calorie malnutrition, severe 12/19/2018  . Left displaced femoral neck fracture ([facility]) 12/18/2018  . Fall at home, initial encounter 12/18/2018  . Dementia with behavioral disturbance ([facility]) 12/18/2018  . Hypertension   . Diabetes mellitus without complication ([facility])   . 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 Malnutrition of moderate degree 02/24/2018  . Hypothermia 02/21/2018  . 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 Syncope and collapse 01/18/2017  . HTN (hypertension) 01/18/2017  . Diabetes ([facility]) 01/18/2017  . Nausea vomiting and diarrhea 01/18/2017    Past Surgical History:  Procedure Laterality Date  . HIP ARTHROPLASTY Left 12/18/2018   Procedure: ARTHROPLASTY BIPOLAR HIP (HEMIARTHROPLASTY);  Surgeon: Juanell Fairly, MD;  Location: [facility] ORS;  Service: Orthopedics;   Laterality: Left;  . REPLACEMENT TOTAL KNEE Left     Prior to Admission medications   Medication Sig Start Date End Date Taking? Authorizing Provider  acetaminophen (TYLENOL) 650 MG CR tablet Take 1 tablet (650 mg total) by mouth every 8 (eight) hours as needed for pain. 12/21/18   Standley Brooking, MD  aspirin 325 MG tablet Take 1 tablet (325 mg total) by mouth daily. 01/20/17   Altamese Dilling, MD  calcium-vitamin D (OSCAL WITH D) 500-200 MG-UNIT tablet Take 2 tablets by mouth daily with breakfast. 12/22/18   Standley Brooking, MD  carvedilol (COREG) 12.5 MG tablet Take 6.25 mg by mouth 2 (two) times daily.     [provider]  donepezil (ARICEPT) 10 MG tablet Take 10 mg by mouth at bedtime. 02/06/16   [provider]  enoxaparin (LOVENOX) 30 MG/0.3ML injection Inject 0.3 mLs (30 mg total) into the skin daily for 28 days. Continue through last day 12/28 or when advised by Dr. Nyoka Lint to stop 12/21/18 01/18/19  Standley Brooking, MD  feeding supplement, GLUCERNA SHAKE, (GLUCERNA SHAKE) LIQD Take 237 mLs by mouth 3 (three) times daily between meals. 12/21/18   Standley Brooking, MD  HYDROcodone-acetaminophen (NORCO/VICODIN) 5-325 MG tablet Take 1 tablet by mouth every 6 (six) hours as needed for moderate pain or severe pain. 12/21/18   Standley Brooking, MD  loratadine (CLARITIN) 10 MG tablet Take 10 mg by mouth daily.    [provider]  losartan (COZAAR) 25 MG tablet Take 25 mg by mouth daily.    [provider]  metFORMIN (GLUCOPHAGE)  500 MG tablet Take 1 tablet (500 mg total) by mouth 2 (two) times daily. 02/24/18   Mayo, Pete Pelt, MD  Multiple Vitamin (MULTIVITAMIN WITH MINERALS) TABS tablet Take 1 tablet by mouth daily. 12/22/18   Samuella Cota, MD  polyethylene glycol (MIRALAX / GLYCOLAX) 17 g packet Take 17 g by mouth daily as needed for mild constipation. 12/21/18   Samuella Cota, MD  pravastatin (PRAVACHOL) 40 MG tablet Take 80 mg by mouth  at bedtime. 01/04/16   [provider]  QUEtiapine (SEROQUEL) 50 MG tablet Take 1 tablet (50 mg total) by mouth at bedtime. 10/18/18   Hinda Kehr, MD  sertraline (ZOLOFT) 25 MG tablet Take 25 mg by mouth 2 (two) times daily. Taking 25 mg in the morning and 25 mg (with 50 mg) at bedtime. 12/14/18   [provider]  traZODone (DESYREL) 50 MG tablet Take 50 mg by mouth at bedtime as needed for sleep.    [provider]  divalproex (DEPAKOTE SPRINKLE) 125 MG capsule Take 125-250 mg by mouth 2 (two) times daily.  10/07/18 10/18/18  [provider]  mirtazapine (REMERON) 7.5 MG tablet Take 7.5 mg by mouth at bedtime.  10/14/18 10/18/18  [provider]    Allergies  Allergen Reactions  . Hydrochlorothiazide W-Triamterene     Other reaction(s): Unknown  . Simvastatin     Other reaction(s): Unknown  . Verapamil     Other reaction(s): Unknown    No family history on file.  Social History Social History   Tobacco Use  . Smoking status: Never Smoker  . Smokeless tobacco: Former Network engineer Use Topics  . Alcohol use: No  . Drug use: No    Review of Systems Constitutional: Negative for fever Cardiovascular: Negative for chest pain. Respiratory: Negative for shortness of breath. Gastrointestinal: Negative for abdominal pain Musculoskeletal: Negative for musculoskeletal complaints Neurological: Negative for headache All other ROS negative  ____________________________________________   PHYSICAL EXAM:  VITAL SIGNS: ED Triage Vitals  Enc Vitals Group     BP 03/06/19 2332 (!) 169/87     Pulse Rate 03/06/19 2332 86     Resp 03/06/19 2332 17     Temp 03/06/19 2332 98.7 F (37.1 C)     Temp Source 03/06/19 2332 Oral     SpO2 03/06/19 2323 97 %     Weight 03/06/19 2324 150 lb (68 kg)     Height 03/06/19 2324 5' (1.524 m)     Head Circumference --      Peak Flow --      Pain Score 03/06/19 2324 0     Pain Loc --      Pain Edu? --       Excl. in [place]? --    Constitutional: Alert and oriented. Well appearing and in no distress. Eyes: Cataract appearing right eye ENT      Head: Normocephalic and atraumatic.      Mouth/Throat: Mucous membranes are moist. Cardiovascular: Normal rate, regular rhythm. Respiratory: Normal respiratory effort without tachypnea nor retractions. Breath sounds are clear  Gastrointestinal: Soft and nontender. No distention.   Musculoskeletal: Nontender with normal range of motion in all extremities.  Neurologic:  Normal speech and language. No gross focal neurologic deficits Skin:  Skin is warm, dry and intact.  Psychiatric: Mood and affect are normal  ____________________________________________    RADIOLOGY  X-rays negative for acute abnormality. CT negative.  ____________________________________________   INITIAL IMPRESSION / ASSESSMENT AND PLAN /  ED COURSE  Pertinent labs & imaging results that were available during my care of the patient were reviewed by me and considered in my medical decision making (see chart for details).   Patient presents to the emergency department for a fall at home.  Currently the patient is awake alert, answering questions appropriately.  Denies any pain.  Great range of motion all extremities without pain elicited.  We will obtain a CT scan of the head as a precaution, pelvis x-ray and a urinalysis.  We will continue to closely monitor.  Overall the patient appears well with a reassuring physical exam.  CT scan and x-ray are negative.  Urinalysis is normal.  Patient appears well.  We will discharge home.  Sosha Shepherd was evaluated in Emergency Department on 03/06/2019 for the symptoms described in the history of present illness. She was evaluated in the context of the global COVID-19 pandemic, which necessitated consideration that the patient might be at risk for infection with the SARS-CoV-2 virus that causes COVID-19. Institutional protocols and  algorithms that pertain to the evaluation of patients at risk for COVID-19 are in a state of rapid change based on information released by regulatory bodies including the CDC and federal and state organizations. These policies and algorithms were followed during the patient's care in the ED.  ____________________________________________   FINAL CLINICAL IMPRESSION(S) / ED DIAGNOSES  Thresa Ross, MD 03/07/19 [phone]

## 2019-03-07 NOTE — ED Notes (Signed)
Pt's granddaughter called and stated she would be unable to arrange/provide for transportation at d/c. Clint Lipps, Consulting civil engineer, made aware and EMS will be contacted to provide transportation.

## 2019-03-07 NOTE — ED Notes (Signed)
Pt's daughter Para March) notified at this time of pt's pending d/c and need to secure a way home. Pt's daughter states she will attempt to contact the pt's granddaughter to arrange for transportation and will return a call to the ED with the plan.

## 2019-03-11 ENCOUNTER — Emergency Department: Payer: Medicare Other

## 2019-03-11 ENCOUNTER — Encounter: Payer: Self-pay | Admitting: Emergency Medicine

## 2019-03-11 ENCOUNTER — Emergency Department
Admission: EM | Admit: 2019-03-11 | Discharge: 2019-03-11 | Disposition: A | Discharge status: Home / Self Care | Payer: Medicare Other | Attending: Student in an Organized Health Care Education/Training Program | Admitting: Student in an Organized Health Care Education/Training Program

## 2019-03-11 DIAGNOSIS — Z7984 Long term (current) use of oral hypoglycemic drugs: Secondary | ICD-10-CM | POA: Insufficient documentation

## 2019-03-11 DIAGNOSIS — Z96652 Presence of left artificial knee joint: Secondary | ICD-10-CM | POA: Insufficient documentation

## 2019-03-11 DIAGNOSIS — Z79899 Other long term (current) drug therapy: Secondary | ICD-10-CM | POA: Insufficient documentation

## 2019-03-11 DIAGNOSIS — Y929 Unspecified place or not applicable: Secondary | ICD-10-CM | POA: Insufficient documentation

## 2019-03-11 DIAGNOSIS — Z7982 Long term (current) use of aspirin: Secondary | ICD-10-CM | POA: Diagnosis not present

## 2019-03-11 DIAGNOSIS — Y999 Unspecified external cause status: Secondary | ICD-10-CM | POA: Insufficient documentation

## 2019-03-11 DIAGNOSIS — E119 Type 2 diabetes mellitus without complications: Secondary | ICD-10-CM | POA: Diagnosis not present

## 2019-03-11 DIAGNOSIS — I10 Essential (primary) hypertension: Secondary | ICD-10-CM | POA: Diagnosis not present

## 2019-03-11 DIAGNOSIS — S8002XA Contusion of left knee, initial encounter: Secondary | ICD-10-CM | POA: Diagnosis not present

## 2019-03-11 DIAGNOSIS — W19XXXA Unspecified fall, initial encounter: Secondary | ICD-10-CM | POA: Diagnosis not present

## 2019-03-11 DIAGNOSIS — Y939 Activity, unspecified: Secondary | ICD-10-CM | POA: Insufficient documentation

## 2019-03-11 DIAGNOSIS — S80912A Unspecified superficial injury of left knee, initial encounter: Secondary | ICD-10-CM | POA: Diagnosis present

## 2019-03-11 DIAGNOSIS — S8000XA Contusion of unspecified knee, initial encounter: Secondary | ICD-10-CM

## 2019-03-11 NOTE — ED Notes (Signed)
Pt assisted to the restroom by this RN.
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
## 2019-03-11 NOTE — ED Notes (Signed)
Patient was assisted to lay down on stretcher. Both siderails are up. Patient was covered with a blanket and room was darkened for comfort. Patient declined discharge vital signs at this time. Waiting for ambulance transport.

## 2019-03-11 NOTE — ED Provider Notes (Signed)
[facility] Emergency Department Provider Note   ____________________________________________   First MD Initiated Contact with Patient 03/11/19 1341     (approximate)  I have reviewed the triage vital signs and the nursing notes.   HISTORY Telephonically via daughter secondary to patient's dementia. Chief Complaint Knee Pain    HPI Stephanie Graham is a 86 y.o. female patient arrived via EMS with a vague complaint of knee pain secondary to fall.  Incident occurred around 3 AM this morning.  Daughter states there was a previous fall 4 days ago.  The home health nurse contacted the daughter and told her patient knee may be broken.  Advised patient may need to come to the emergency room for x-rays..  Patient has dementia and history is hard to obtain.  Patient is blind in the right.     Past Medical History:  Diagnosis Date  . 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 Diabetes mellitus without complication (HCC)   . Fall at home, initial encounter 12/18/2018  . Hypertension     Patient Active Problem List   Diagnosis Date Noted  . Acute blood loss anemia 12/19/2018  . Thrombocytopenia (HCC) 12/19/2018  . Protein-calorie malnutrition, severe 12/19/2018  . Left displaced femoral neck fracture (HCC) 12/18/2018  . Fall at home, initial encounter 12/18/2018  . Dementia with behavioral disturbance (HCC) 12/18/2018  . Hypertension   . Diabetes mellitus without complication (HCC)   . 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 Malnutrition of moderate degree 02/24/2018  . Hypothermia 02/21/2018  . 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 Syncope and collapse 01/18/2017  . HTN (hypertension) 01/18/2017  . Diabetes (HCC) 01/18/2017  . Nausea vomiting and diarrhea 01/18/2017    Past Surgical History:  Procedure Laterality Date  . HIP ARTHROPLASTY Left 12/18/2018   Procedure: ARTHROPLASTY BIPOLAR HIP  (HEMIARTHROPLASTY);  Surgeon: Juanell Fairly, MD;  Location: [facility] ORS;  Service: Orthopedics;  Laterality: Left;  . REPLACEMENT TOTAL KNEE Left     Prior to Admission medications   Medication Sig Start Date End Date Taking? Authorizing Provider  acetaminophen (TYLENOL) 650 MG CR tablet Take 1 tablet (650 mg total) by mouth every 8 (eight) hours as needed for pain. 12/21/18   Standley Brooking, MD  aspirin 325 MG tablet Take 1 tablet (325 mg total) by mouth daily. 01/20/17   Altamese Dilling, MD  calcium-vitamin D (OSCAL WITH D) 500-200 MG-UNIT tablet Take 2 tablets by mouth daily with breakfast. 12/22/18   Standley Brooking, MD  carvedilol (COREG) 12.5 MG tablet Take 6.25 mg by mouth 2 (two) times daily.     [provider]  donepezil (ARICEPT) 10 MG tablet Take 10 mg by mouth at bedtime. 02/06/16   [provider]  enoxaparin (LOVENOX) 30 MG/0.3ML injection Inject 0.3 mLs (30 mg total) into the skin daily for 28 days. Continue through last day 12/28 or when advised by Dr. Nyoka Lint to stop 12/21/18 01/18/19  Standley Brooking, MD  feeding supplement, GLUCERNA SHAKE, (GLUCERNA SHAKE) LIQD Take 237 mLs by mouth 3 (three) times daily between meals. 12/21/18   Standley Brooking, MD  HYDROcodone-acetaminophen (NORCO/VICODIN) 5-325 MG tablet Take 1 tablet by mouth every 6 (six) hours as needed for moderate pain or severe pain. 12/21/18   Standley Brooking, MD  loratadine (CLARITIN) 10 MG tablet Take 10 mg by mouth daily.    [provider]  losartan (  COZAAR) 25 MG tablet Take 25 mg by mouth daily.    [provider]  metFORMIN (GLUCOPHAGE) 500 MG tablet Take 1 tablet (500 mg total) by mouth 2 (two) times daily. 02/24/18   Mayo, Allyn Kenner, MD  Multiple Vitamin (MULTIVITAMIN WITH MINERALS) TABS tablet Take 1 tablet by mouth daily. 12/22/18   Standley Brooking, MD  polyethylene glycol (MIRALAX / GLYCOLAX) 17 g packet Take 17 g by mouth daily as needed for mild  constipation. 12/21/18   Standley Brooking, MD  pravastatin (PRAVACHOL) 40 MG tablet Take 80 mg by mouth at bedtime. 01/04/16   [provider]  QUEtiapine (SEROQUEL) 50 MG tablet Take 1 tablet (50 mg total) by mouth at bedtime. 10/18/18   Loleta Rose, MD  sertraline (ZOLOFT) 25 MG tablet Take 25 mg by mouth 2 (two) times daily. Taking 25 mg in the morning and 25 mg (with 50 mg) at bedtime. 12/14/18   [provider]  traZODone (DESYREL) 50 MG tablet Take 50 mg by mouth at bedtime as needed for sleep.    [provider]  divalproex (DEPAKOTE SPRINKLE) 125 MG capsule Take 125-250 mg by mouth 2 (two) times daily.  10/07/18 10/18/18  [provider]  mirtazapine (REMERON) 7.5 MG tablet Take 7.5 mg by mouth at bedtime.  10/14/18 10/18/18  [provider]    Allergies Hydrochlorothiazide w-triamterene, Simvastatin, and Verapamil  History reviewed. No pertinent family history.  Social History Social History   Tobacco Use  . Smoking status: Never Smoker  . Smokeless tobacco: Former Engineer, water Use Topics  . Alcohol use: No  . Drug use: No    Review of Systems Constitutional: No fever/chills Eyes: No visual changes. ENT: No sore throat. Cardiovascular: Denies chest pain. Respiratory: Denies shortness of breath. Gastrointestinal: No abdominal pain.  No nausea, no vomiting.  No diarrhea.  No constipation. Genitourinary: Negative for dysuria. Musculoskeletal: Left knee pain Skin: Negative for rash. Neurological: Negative for headaches, focal weakness or numbness. Psychiatric:  Dementia. Endocrine:  Diabetes, hyperlipidemia, hypertension. Hematological/Lymphatic:  Allergic/Immunilogical: Hydrochlorothiazide, simvastatin, and verapamil.  ____________________________________________   PHYSICAL EXAM:  VITAL SIGNS: ED Triage Vitals [03/11/19 1333]  Enc Vitals Group     BP (!) 158/74     Pulse Rate 77     Resp 14     Temp 98.2 F (36.8  C)     Temp Source Oral     SpO2 99 %     Weight 140 lb (63.5 kg)     Height 5\' 2"  (1.575 m)     Head Circumference      Peak Flow      Pain Score      Pain Loc      Pain Edu?      Excl. in GC?    Constitutional: Alert and disorientated. Well appearing and in no acute distress. Cardiovascular: Normal rate, regular rhythm. Grossly normal heart sounds.  Good peripheral circulation. Respiratory: Normal respiratory effort.  No retractions. Lungs CTAB. Gastrointestinal: Soft and nontender. No distention. No abdominal bruits. No CVA tenderness. Genitourinary: Deferred Musculoskeletal: No obvious deformity to the left lower extremity.  Patient has mild crepitus with palpation anterior patella.  Neurologic:  Normal speech and language. No gross focal neurologic deficits are appreciated. No gait instability. Skin:  Skin is warm, dry and intact. No rash noted.  No abrasion or ecchymosis. Psychiatric:  Speech and behavior are erratic.  ____________________________________________   LABS (all labs ordered are listed, but  only abnormal results are displayed)  Labs Reviewed - No data to display ____________________________________________  EKG   ____________________________________________  RADIOLOGY  ED MD interpretation:    Official radiology report(s): DG Knee Complete 4 Views Left  Result Date: 03/11/2019 CLINICAL DATA:  Left knee pain due to an injury suffered in a fall at 3 a.m. last night. Initial encounter. EXAM: LEFT KNEE - COMPLETE 4+ VIEW COMPARISON:  None. FINDINGS: There is no acute bony or joint abnormality. The patient has advanced osteoarthritis about all 3 compartments of the knee. Small to moderate joint effusion is noted. Atherosclerosis is identified. IMPRESSION: No acute abnormality. Tricompartmental osteoarthritis. Small to moderate knee joint effusion. Electronically Signed   By: Inge Rise M.D.   On: 03/11/2019 14:43     ____________________________________________   PROCEDURES  Procedure(s) performed (including Critical Care):  Procedures   ____________________________________________   INITIAL IMPRESSION / ASSESSMENT AND PLAN / ED COURSE  As part of my medical decision making, I reviewed the following data within the electronic MEDICAL RECORD NUMBER     Knee pain secondary to fall.  Discussed x-ray findings with daughter telephonically.  Advised supportive care continue previous medications.  Discharge instructions.    Nonna Renninger was evaluated in Emergency Department on 03/11/2019 for the symptoms described in the history of present illness. She was evaluated in the context of the global COVID-19 pandemic, which necessitated consideration that the patient might be at risk for infection with the SARS-CoV-2 virus that causes COVID-19. Institutional protocols and algorithms that pertain to the evaluation of patients at risk for COVID-19 are in a state of rapid change based on information released by regulatory bodies including the CDC and federal and state organizations. These policies and algorithms were followed during the patient's care in the ED.       ____________________________________________   FINAL CLINICAL IMPRESSION(S) / ED DIAGNOSES  Final diagnoses:  Contusion of knee, unspecified laterality, initial encounter     ED Discharge Orders    None       Note:  This document was prepared using Dragon voice recognition software and may include unintentional dictation errors.    Sable Feil, PA-C 03/11/19 1514    Merlyn Lot, MD 03/11/19 [phone]

## 2019-03-11 NOTE — ED Notes (Signed)
Patient transported home via EMS. Patient's daughter aware of discharge instructions and verbal consent given for discharge. Unable to obtain discharge signature.

## 2019-03-11 NOTE — ED Notes (Signed)
Report given to patient's daughter Para March, who states she doesn't have a car and requested that the patient be transported by ambulance.

## 2019-03-11 NOTE — ED Triage Notes (Signed)
Pt presents to ED via [facility] from home c/o fall around 3am today per EMS report. Pt ambulated today with PT but was complaining of L leg pain. Pt's daughter via phone states that pt's home health nurse thinks pt's L knee appears swollen and might be broken so they sent pt in for xray. Hx dementia, pt is confused at baseline per family. Daughter states to call her with any questions [phone]).

## 2019-03-11 NOTE — ED Notes (Signed)
Pt in hall with pillow and blanket. Stating she is looking for her bed. This tech and [place], [facility] assisted pt back to room via wheelchair. Comfort measures provided and side rails up. Call bell within reach.

## 2019-03-11 NOTE — ED Notes (Signed)
Door is open to room to maintain visual of patient. Patient is sitting on end of stretcher and declined to move further on the stretcher.

## 2019-03-11 NOTE — ED Notes (Signed)
Pt standing at door. RN in room. Pt stating that someone needs to call the ambulance because there is something wrong with her baby. Pt states that her baby is wrapped in the blanket and there is something wrong with it. RN showed pt that there is no baby in the blanket.

## 2019-03-11 NOTE — Discharge Instructions (Addendum)
X-ray findings consistent with osteoarthritis with mild effusion to the left knee.  No fracture.

## 2019-03-11 NOTE — ED Notes (Signed)
Patient was assisted to hallway bathroom via wheelchair. Patient requested to remain in wheelchair upon return to the room. Tv is on. Patient given a diet ginger ale. Waiting for transport.

## 2019-03-11 NOTE — ED Notes (Signed)
Pt attempting again to get out of chair.. Pt states that she needs to look for her baby. Pt informed that she needs to stay in the chair. Door left open for safety

## 2019-03-31 ENCOUNTER — Other Ambulatory Visit: Payer: Self-pay

## 2019-03-31 ENCOUNTER — Telehealth: Payer: Self-pay | Admitting: Nurse Practitioner

## 2019-03-31 ENCOUNTER — Other Ambulatory Visit: Payer: Medicare Other | Admitting: Nurse Practitioner

## 2019-03-31 NOTE — Telephone Encounter (Signed)
I called Ms. Stephanie Constant, Ms. Thompsons daughter for initial scheduled palliative care consult, no answer, message left with contact information

## 2019-04-14 ENCOUNTER — Other Ambulatory Visit: Payer: Medicare Other | Admitting: Nurse Practitioner

## 2019-04-14 ENCOUNTER — Encounter: Payer: Self-pay | Admitting: Nurse Practitioner

## 2019-04-14 ENCOUNTER — Other Ambulatory Visit: Payer: Self-pay

## 2019-04-14 DIAGNOSIS — Z515 Encounter for palliative care: Secondary | ICD-10-CM

## 2019-04-14 DIAGNOSIS — F0390 Unspecified dementia without behavioral disturbance: Secondary | ICD-10-CM

## 2019-04-14 NOTE — Progress Notes (Signed)
[place] Consult Note Telephone: [phone]  Fax: [phone]  PATIENT NAME: Stephanie Graham DOB: [birth year] MRN: [id number]  PRIMARY CARE PROVIDER:   Dion Body, MD  REFERRING PROVIDER:  Dion Body, MD Stephanie Graham [place],  [place] [address]  RESPONSIBLE PARTY:   Stephanie. [name]. [phone]  I was asked by Dr Netty Starring to see Stephanie Graham for Palliative care consult for goals of care  Due to the COVID-19 crisis, this visit was done via telemedicine from my office and it was initiated and consent by this patient and or family.   RECOMMENDATIONS and PLAN:  1. ACP: Full code, will mail blank most form, Hard Choice book for further discussion with family then revisit with Stephanie Graham at next [facility] visit or sooner should family make a decision   2. Palliative care encounter; Palliative medicine team will continue to support patient, patient's family, and medical team. Visit consisted of counseling and education dealing with the complex and emotionally intense issues of symptom management and palliative care in the setting of serious and potentially life-threatening illness  I spent 70 minutes providing this consultation,  from 1:00pm to 2:10pm. Graham than 50% of the time in this consultation was spent coordinating communication.   HISTORY OF PRESENT ILLNESS:  Stephanie Graham is a 86 y.o. year old female with multiple medical problems including CVA, systolic congestive heart failure (ef 40% 3 / 2013). dementia with behavioral disturbances, hypertension, diabetes, thrombocytopenia, protein calorie malnutrition, anemia, blindness right eye. Hospitalized 11 / 27 / 2020 to 37 / 1 / 2020 after a hip fracture left left hip hemiarthroplasty on 12/18/2018. Her documentation if she does hallucinate intermittently. Telemedicine visit 2 / 5 / 2021 with Dr Netty Starring for tachycardia discover by home  health nurse with heart rate 114 noted the Coreg was discontinued. Coreg restarted at half dose 3.125 mg bid. She lives at home with her Graham. Emergency department visit 2/ 14 / 2021 to 2 / 15 / 2021 after a fall , slipped off the commode with mild soreness in her buttocks. CT scan, x-rays negative with normal urine and discharged home. Emergency department visit 2 / 19 / 2021 for knee pain after a fall four days prior. X-rays revealed contusion and discharged home. I called Stephanie Graham, Stephanie Graham for scheduled telemedicine telephonic is video not available follow-up palliative care visit. We talked about purpose of palliative care visit and Stephanie Graham in agreement. We talked about the last time Stephanie Graham lived by herself which was about five years ago. We talked about the transition to residing with her granddaughter secondary to dementia. We talked about past medical history. We talked about chronic disease progression. We talked about natural process of aging. We talked about recent hospitalization. We talked about her work at [employer] at [employer] where she acquired covid. We talked about her discharged home with therapy. Stephanie. [name] endorses Stephanie. Graham just had an appointment with her Orthopedic who is going to order Graham home Physical Therapy. We talked about are functional ability prior to hospitalization where she was ambulating without difficulty. Since hospitalization and Rehab she is required to use a walker. Stephanie Tuft has been falling at home since. We talked about her requirements for adl's and toileting Graham with assistance. Stephanie Obyrne we talked about Stephanie Behrendt able to feed ourselves. We talked about her appetite. We talked about weight. We talked about medical goals of care she currently  is a full code. We talked about scenarios with DNR versus full code. Stephanie Graham endorses she wouldn't want her to be resuscitated but she wants to further discuss with her family. We talked about  most forms. Stephanie. Christell Graham in agreement to have blank most form in her choice book sent for review and will revisit at next palliative care follow-up visit. Stephanie Graham is aware that she can contact sooner should family make a decision about code status. We talked about role of palliative care and plan of care. This Graham was very grateful for palliative care visit and discussion. We talked about health care power of attorney. Will text to see if that is available to assist Stephanie Graham and getting that complete. Stephanie Graham talked about long-term goals and hopes of keeping Stephanie Guidry at home. Stephanie Graham and Graham says she is also realistic if it comes the time where they are not able to care for her how they will go about facility placement. We talked about facility placement and we can assist with palliative care social worker and process in placement. Stephanie Graham was very thankful. We talked about Life review. We talked about coping strategies. Therapeutic listening and emotional support provided. Contact information. Questions answered to satisfaction. Palliative Care was asked to help address goals of care.   CODE STATUS: Full code  PPS: 40% HOSPICE ELIGIBILITY/DIAGNOSIS: TBD  PAST MEDICAL HISTORY:  Past Medical History:  Diagnosis Date  . 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 Diabetes mellitus without complication (HCC)   . Fall at home, initial encounter 12/18/2018  . Hypertension     SOCIAL HX:  Social History   Tobacco Use  . Smoking status: Never Smoker  . Smokeless tobacco: Former Engineer, water Use Topics  . Alcohol use: No    ALLERGIES:  Allergies  Allergen Reactions  . Hydrochlorothiazide W-Triamterene     Other reaction(s): Unknown  . Simvastatin     Other reaction(s): Unknown  . Verapamil     Other reaction(s): Unknown     PERTINENT MEDICATIONS:  Outpatient Encounter Medications as of 04/14/2019  Medication Sig  . acetaminophen (TYLENOL) 650 MG CR  tablet Take 1 tablet (650 mg total) by mouth every 8 (eight) hours as needed for pain.  Marland Kitchen aspirin 325 MG tablet Take 1 tablet (325 mg total) by mouth daily.  . calcium-vitamin D (OSCAL WITH D) 500-200 MG-UNIT tablet Take 2 tablets by mouth daily with breakfast.  . carvedilol (COREG) 12.5 MG tablet Take 6.25 mg by mouth 2 (two) times daily.   Marland Kitchen donepezil (ARICEPT) 10 MG tablet Take 10 mg by mouth at bedtime.  . enoxaparin (LOVENOX) 30 MG/0.3ML injection Inject 0.3 mLs (30 mg total) into the skin daily for 28 days. Continue through last day 12/28 or when advised by Dr. Nyoka Lint to stop  . feeding supplement, GLUCERNA SHAKE, (GLUCERNA SHAKE) LIQD Take 237 mLs by mouth 3 (three) times daily between meals.  Marland Kitchen HYDROcodone-acetaminophen (NORCO/VICODIN) 5-325 MG tablet Take 1 tablet by mouth every 6 (six) hours as needed for moderate pain or severe pain.  Marland Kitchen loratadine (CLARITIN) 10 MG tablet Take 10 mg by mouth daily.  Marland Kitchen losartan (COZAAR) 25 MG tablet Take 25 mg by mouth daily.  . metFORMIN (GLUCOPHAGE) 500 MG tablet Take 1 tablet (500 mg total) by mouth 2 (two) times daily.  . Multiple Vitamin (MULTIVITAMIN WITH MINERALS) TABS tablet Take 1 tablet by mouth daily.  . polyethylene glycol (  MIRALAX / GLYCOLAX) 17 g packet Take 17 g by mouth daily as needed for mild constipation.  . pravastatin (PRAVACHOL) 40 MG tablet Take 80 mg by mouth at bedtime.  Marland Kitchen QUEtiapine (SEROQUEL) 50 MG tablet Take 1 tablet (50 mg total) by mouth at bedtime.  . sertraline (ZOLOFT) 25 MG tablet Take 25 mg by mouth 2 (two) times daily. Taking 25 mg in the morning and 25 mg (with 50 mg) at bedtime.  . traZODone (DESYREL) 50 MG tablet Take 50 mg by mouth at bedtime as needed for sleep.  . [DISCONTINUED] divalproex (DEPAKOTE SPRINKLE) 125 MG capsule Take 125-250 mg by mouth 2 (two) times daily.   . [DISCONTINUED] mirtazapine (REMERON) 7.5 MG tablet Take 7.5 mg by mouth at bedtime.    No facility-administered encounter medications on  file as of 04/14/2019.    PHYSICAL EXAM:   Deferred  Amika Tassin Z Brinly Maietta, NP

## 2019-04-26 ENCOUNTER — Other Ambulatory Visit: Payer: Medicare Other | Admitting: Nurse Practitioner

## 2019-04-26 ENCOUNTER — Encounter: Payer: Self-pay | Admitting: Nurse Practitioner

## 2019-04-26 ENCOUNTER — Other Ambulatory Visit: Payer: Self-pay

## 2019-04-26 DIAGNOSIS — Z515 Encounter for palliative care: Secondary | ICD-10-CM

## 2019-04-26 DIAGNOSIS — F0390 Unspecified dementia without behavioral disturbance: Secondary | ICD-10-CM

## 2019-04-26 NOTE — Progress Notes (Signed)
[facility] Consult Note Telephone: [phone]  Fax: [phone]  PATIENT NAME: Stephanie Graham DOB: 09-Sep-1921 MRN: [id number]  PRIMARY CARE PROVIDER:   Dion Body, MD  REFERRING PROVIDER:  Dion Body, MD [place] [facility] [place],   [id number]  RESPONSIBLE PARTY:   [name] daughter. [phone]  RECOMMENDATIONS and PLAN: 1.ACP: Full code, received MOST form, Hard Choice book, reviewing with family, will schedule f/u visit to complete in person one week once [name] finish to speak with the rest of family.  2.Palliative care encounter; Palliative medicine team will continue to support patient, patient's family, and medical team. Visit consisted of counseling and education dealing with the complex and emotionally intense issues of symptom management and palliative care in the setting of serious and potentially life-threatening illness  Due to the COVID-19 crisis, this visit was done via telemedicine from my office and it was initiated and consent by this patient and or family.  I spent 47 minutes providing this consultation,  from 12:00pm to 12:47pm . More than 50% of the time in this consultation was spent coordinating communication.   HISTORY OF PRESENT ILLNESS:  Stephanie Graham is a 57 y.o. year old female with multiple medical problems including CVA, systolic congestive heart failure (ef 40% 3 / 2013). dementia with behavioral disturbances, hypertension, diabetes, thrombocytopenia, protein calorie malnutrition, anemia, blindness right eye. Hospitalized 11 / 27 / 2020 to 18 / 1 / 2020 after a hip fracture left left hip hemiarthroplasty on 12/18/2018. Her documentation if she does hallucinate intermittently. Telemedicine visit 2 / 5 / 2021 with Dr Netty Starring for tachycardia discover by home health nurse with heart rate 114 noted the Coreg was discontinued. Coreg restarted at  half dose 3.125 mg bid. She lives at home with her daughter. Emergency department visit 2/ 14 / 2021 to 2 / 15 / 2021 after a fall , slipped off the commode with mild soreness in her buttocks. CT scan, x-rays negative with normal urine and discharged home. Emergency department visit 2 / 19 / 2021 for knee pain after a fall four days prior. X-rays revealed contusion and discharged home.I called [name], Ms Knoll's daughter for follow-up telemedicine telephonic as video not available palliative care follow-up visit. [name] and I talked about purpose of palliative care visit. Jeanette in agreement. We talked about how Ms Paule has been doing. [name] continues to require assistance for mobility, transfers that she ambulate while someone is moderately helping her. [name] is not able to ambulate on her own. [name] endorses her and caregivers give her a bath, dress her. Ms Butrum has been feeding herself and appetite has been improving. [name] endorses that her weight has increased by 3 lb. [name] endorses they did have a follow-up with the orthopedic surgeon and things look like they were going well. Ms Loss still has not had Physical Therapy started as pending insurance. [name] talked about exercises that they were able to do at home. We talked about medical goals of care including aggressive versus conservative versus comfort care. [name] endorses they did receive Hard Choice book, blank most forms a review.  [name] endorses she is reviewed with all family members aside from her son for what she will do to this evening. Ms Vahle endorses wishes are to be more focus on comfort care, treat what is treatable. We talked about blank verse form and schedule they follow up palliative care visit in person to complete most  form in one week. Jeanette in agreement. Therapeutic listening, emotional support provided. Contact information. Questions answered to satisfaction. Appointment  scheduled. Para March thankful for palliative care visit and support.  Palliative Care was asked to help to continue to address goals of care.   CODE STATUS: full code  PPS: 40% HOSPICE ELIGIBILITY/DIAGNOSIS: TBD  PAST MEDICAL HISTORY:  Past Medical History:  Diagnosis Date  . 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 Diabetes mellitus without complication (HCC)   . Fall at home, initial encounter 12/18/2018  . Hypertension     SOCIAL HX:  Social History   Tobacco Use  . Smoking status: Never Smoker  . Smokeless tobacco: Former Engineer, water Use Topics  . Alcohol use: No    ALLERGIES:  Allergies  Allergen Reactions  . Hydrochlorothiazide W-Triamterene     Other reaction(s): Unknown  . Simvastatin     Other reaction(s): Unknown  . Verapamil     Other reaction(s): Unknown     PERTINENT MEDICATIONS:  Outpatient Encounter Medications as of 04/26/2019  Medication Sig  . acetaminophen (TYLENOL) 650 MG CR tablet Take 1 tablet (650 mg total) by mouth every 8 (eight) hours as needed for pain.  Marland Kitchen aspirin 325 MG tablet Take 1 tablet (325 mg total) by mouth daily.  . calcium-vitamin D (OSCAL WITH D) 500-200 MG-UNIT tablet Take 2 tablets by mouth daily with breakfast.  . carvedilol (COREG) 12.5 MG tablet Take 6.25 mg by mouth 2 (two) times daily.   Marland Kitchen donepezil (ARICEPT) 10 MG tablet Take 10 mg by mouth at bedtime.  . enoxaparin (LOVENOX) 30 MG/0.3ML injection Inject 0.3 mLs (30 mg total) into the skin daily for 28 days. Continue through last day 12/28 or when advised by Dr. Nyoka Lint to stop  . feeding supplement, GLUCERNA SHAKE, (GLUCERNA SHAKE) LIQD Take 237 mLs by mouth 3 (three) times daily between meals.  Marland Kitchen HYDROcodone-acetaminophen (NORCO/VICODIN) 5-325 MG tablet Take 1 tablet by mouth every 6 (six) hours as needed for moderate pain or severe pain.  Marland Kitchen loratadine (CLARITIN) 10 MG tablet Take 10 mg by mouth daily.  Marland Kitchen losartan (COZAAR) 25 MG tablet Take 25  mg by mouth daily.  . metFORMIN (GLUCOPHAGE) 500 MG tablet Take 1 tablet (500 mg total) by mouth 2 (two) times daily.  . Multiple Vitamin (MULTIVITAMIN WITH MINERALS) TABS tablet Take 1 tablet by mouth daily.  . polyethylene glycol (MIRALAX / GLYCOLAX) 17 g packet Take 17 g by mouth daily as needed for mild constipation.  . pravastatin (PRAVACHOL) 40 MG tablet Take 80 mg by mouth at bedtime.  Marland Kitchen QUEtiapine (SEROQUEL) 50 MG tablet Take 1 tablet (50 mg total) by mouth at bedtime.  . sertraline (ZOLOFT) 25 MG tablet Take 25 mg by mouth 2 (two) times daily. Taking 25 mg in the morning and 25 mg (with 50 mg) at bedtime.  . traZODone (DESYREL) 50 MG tablet Take 50 mg by mouth at bedtime as needed for sleep.  . [DISCONTINUED] divalproex (DEPAKOTE SPRINKLE) 125 MG capsule Take 125-250 mg by mouth 2 (two) times daily.   . [DISCONTINUED] mirtazapine (REMERON) 7.5 MG tablet Take 7.5 mg by mouth at bedtime.    No facility-administered encounter medications on file as of 04/26/2019.    PHYSICAL EXAM:   deferred  Tewana Bohlen Prince [place], NP

## 2019-05-04 ENCOUNTER — Other Ambulatory Visit: Payer: Medicare Other | Admitting: Nurse Practitioner

## 2019-05-04 ENCOUNTER — Encounter: Payer: Self-pay | Admitting: Nurse Practitioner

## 2019-05-04 ENCOUNTER — Other Ambulatory Visit: Payer: Self-pay

## 2019-05-04 DIAGNOSIS — F0390 Unspecified dementia without behavioral disturbance: Secondary | ICD-10-CM

## 2019-05-04 DIAGNOSIS — Z515 Encounter for palliative care: Secondary | ICD-10-CM

## 2019-05-04 NOTE — Progress Notes (Signed)
[facility] Consult Note Telephone: [phone]  Fax: [phone]  PATIENT NAME: Stephanie Graham DOB: Jun 20, 1921 MRN: [id number]  PRIMARY CARE PROVIDER:   Dion Body, MD  REFERRING PROVIDER:  Dion Body, MD [place] [facility] [place],  [place] [address] RESPONSIBLE PARTY:Stephanie. Rise Mu daughter. [phone]  RECOMMENDATIONS and PLAN: 1.YFV:CBSWHQP to DNR, placed in [place], received MOST form, Hard Choice book, wants to further review with family, will revisit at next [facility] visit or sooner if family decides earlier completion  Lt arm 6 1/2 inches Lt leg 11 inches  2.Palliative care encounter; Palliative medicine team will continue to support patient, patient's family, and medical team. Visit consisted of counseling and education dealing with the complex and emotionally intense issues of symptom management and palliative care in the setting of serious and potentially life-threatening illness  I spent 65 minutes providing this consultation,  from 9:00am to 10:05am. More than 50% of the time in this consultation was spent coordinating communication.   HISTORY OF PRESENT ILLNESS:  Stephanie Graham is a 86 y.o. year old female with multiple medical problems including CVA, systolic congestive heart failure (ef 40% 3 / 2013). dementia with behavioral disturbances, hypertension, diabetes, thrombocytopenia, protein calorie malnutrition, anemia, blindness right eye. Hospitalized 11 / 27 / 2020 to 84 / 1 / 2020 after a hip fracture left left hip hemiarthroplasty on 12/18/2018. Per documentation if she does hallucinate intermittently. Telemedicine visit 2 / 5 / 2021 with Dr Netty Starring for tachycardia discover by home health nurse with heart rate 114 noted the Coreg was discontinued. Coreg restarted at half dose 3.125 mg bid. She lives at home with her daughter. Emergency department visit 2/ 14 / 2021 to 2 /  15 / 2021 after a fall , slipped off the commode with mild soreness in her buttocks. CT scan, x-rays negative with normal urine and discharged home. Emergency department visit 2 / 19 / 2021 for knee pain after a fall four days prior. X-rays revealed contusion and discharged home.   Palliative care follow-up visit in person for ongoing discussion of medical goals of care. Stephanie Graham daughter was also home during visit. We talked about purpose of palliative care visit, Stephanie Graham in agreement. Stephanie Graham was sitting in her wheelchair in the living room. She appeared thin, elderly, very hard hard of hearing but comfortable. I asked Stephanie. Runner how she was feeling. Stephanie Graham endorses that she is doing just fine. We talked about no recent Falls. Stephanie Graham does walk with her rolaid walker and at times gets up on her own. Stephanie Graham does require total ADL care. I asked Stephanie Graham how she was feeling. Stephanie Graham endorses that she is doing just fine. We talked about no recent falls. Stephanie Graham does walk with her rolaid walker and at times gets up on her own. Stephanie Graham does continue to require assistance with ADLs, bathing, dressing. Stephanie Graham does feed herself with appetite being fairly good. Stephanie Graham drinks 3 Glucerna supplements a day. We talked about offsetting the time of supplements aside from three meals. We talked about nutrition. We talked about chronic disease progression of dementia. We talked about realistic expectations. Stephanie Graham talked at length about family Longevity if she has a sister that is close to a 32 years old. We talked about family dynamics. Stephanie. Gully granddaughter does reside with her as her caregiver. Family does alternate to assist with caregiving needs. We talked about age progression.  We talked about medical goals of care. Stephanie Graham endorses she did already speak with her daughter about the MOST form, Hard Choice book. Stephanie Graham endorses she does need  to further discuss with her Graham as he felt like she should remain a full code. Stephanie Graham and I talked about current clinical condition. We talked about different scenarios of CPR. Showed a video of CPR to Stephanie Graham. We talked about comorbidities, age and damage that could be done during CPR. Stephanie Graham endorses that she does not want Stephanie. Chillemi to have CPR and is an agreement to do not resuscitate. Stephanie Graham also was in agreement to completing [place] form which was down and placed in [place]. Janette endorses that she will talk to her son, Stephanie. Kukla's Graham and he will be okay with the DNR once he learns of the damage that it could do to her body. 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 We talked about most form. We talked about options of treatment including aggressive versus conservative versus comfort care. We talked about IV fluids and antibiotics. We talked about tube feeding. We talked about what that would look like. Stephanie Graham endorse is that she would not want her to have two feedings. Stephanie Graham endorses that she does want to talk further with her daughter and Graham about most form options. We talked about quality of life versus quantity of days. We talked about was suffering looks like to Stephanie Graham. We talked about role of palliative care in plan of care. We talked about follow-up visit in 4 weeks to see about weight loss will continue to measure her arm and thigh. We talked about Hospice benefit under Medicare program. We talked about hospice eligibility and what that criteria looks like. 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 At present time with good appetite will continue to nonitor and follow with palliative care. We schedule follow-up palliative care visit. Therapeutic listening with emotional support provided. Offered to speak with Stephanie Graham and daughter if Stephanie Graham needs help with further discussion. Stephanie Graham endorses she is  very thankful for palliative care visit and discussion, it helps a lot very enlightening. Questions answered to satisfaction. Contact information provided.  Palliative Care was asked to help to continue to address goals of care.   CODE STATUS: DNR  PPS: 40% HOSPICE ELIGIBILITY/DIAGNOSIS: TBD  PAST MEDICAL HISTORY:  Past Medical History:  Diagnosis Date  . 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 Diabetes mellitus without complication (HCC)   . Fall at home, initial encounter 12/18/2018  . Hypertension     SOCIAL HX:  Social History   Tobacco Use  . Smoking status: Never Smoker  . Smokeless tobacco: Former Engineer, water Use Topics  . Alcohol use: No    ALLERGIES:  Allergies  Allergen Reactions  . Hydrochlorothiazide W-Triamterene     Other reaction(s): Unknown  . Simvastatin     Other reaction(s): Unknown  . Verapamil     Other reaction(s): Unknown     PERTINENT MEDICATIONS:  Outpatient Encounter Medications as of 05/04/2019  Medication Sig  . acetaminophen (TYLENOL) 650 MG CR tablet Take 1 tablet (650 mg total) by mouth every 8 (eight) hours as needed for pain.  Marland Kitchen aspirin 325 MG tablet Take 1 tablet (325 mg total) by mouth daily.  . calcium-vitamin D (OSCAL WITH D) 500-200 MG-UNIT tablet Take 2 tablets by mouth daily with breakfast.  . carvedilol (COREG) 12.5 MG tablet  Take 6.25 mg by mouth 2 (two) times daily.   Marland Kitchen donepezil (ARICEPT) 10 MG tablet Take 10 mg by mouth at bedtime.  . enoxaparin (LOVENOX) 30 MG/0.3ML injection Inject 0.3 mLs (30 mg total) into the skin daily for 28 days. Continue through last day 12/28 or when advised by Dr. Nyoka Lint to stop  . feeding supplement, GLUCERNA SHAKE, (GLUCERNA SHAKE) LIQD Take 237 mLs by mouth 3 (three) times daily between meals.  Marland Kitchen HYDROcodone-acetaminophen (NORCO/VICODIN) 5-325 MG tablet Take 1 tablet by mouth every 6 (six) hours as needed for moderate pain or severe pain.  Marland Kitchen loratadine (CLARITIN)  10 MG tablet Take 10 mg by mouth daily.  Marland Kitchen losartan (COZAAR) 25 MG tablet Take 25 mg by mouth daily.  . metFORMIN (GLUCOPHAGE) 500 MG tablet Take 1 tablet (500 mg total) by mouth 2 (two) times daily.  . Multiple Vitamin (MULTIVITAMIN WITH MINERALS) TABS tablet Take 1 tablet by mouth daily.  . polyethylene glycol (MIRALAX / GLYCOLAX) 17 g packet Take 17 g by mouth daily as needed for mild constipation.  . pravastatin (PRAVACHOL) 40 MG tablet Take 80 mg by mouth at bedtime.  Marland Kitchen QUEtiapine (SEROQUEL) 50 MG tablet Take 1 tablet (50 mg total) by mouth at bedtime.  . sertraline (ZOLOFT) 25 MG tablet Take 25 mg by mouth 2 (two) times daily. Taking 25 mg in the morning and 25 mg (with 50 mg) at bedtime.  . traZODone (DESYREL) 50 MG tablet Take 50 mg by mouth at bedtime as needed for sleep.  . [DISCONTINUED] divalproex (DEPAKOTE SPRINKLE) 125 MG capsule Take 125-250 mg by mouth 2 (two) times daily.   . [DISCONTINUED] mirtazapine (REMERON) 7.5 MG tablet Take 7.5 mg by mouth at bedtime.    No facility-administered encounter medications on file as of 05/04/2019.    PHYSICAL EXAM:   General: NAD, frail appearing, thin, elderly confused female Cardiovascular: regular rate and rhythm Pulmonary: clear ant fields Abdomen: soft, nontender, + bowel sounds Extremities: no edema, no joint deformities/muscle wasting Neurological: unsteady gait, walks with rolling walker  Kemper Heupel Z Harmoni Lucus, NP

## 2019-05-25 ENCOUNTER — Emergency Department (HOSPITAL_COMMUNITY): Payer: Medicare Other

## 2019-05-25 ENCOUNTER — Emergency Department (HOSPITAL_COMMUNITY)
Admission: EM | Admit: 2019-05-25 | Discharge: 2019-05-26 | Disposition: A | Discharge status: Home / Self Care | Payer: Medicare Other | Attending: Emergency Medicine | Admitting: Emergency Medicine

## 2019-05-25 DIAGNOSIS — Z79899 Other long term (current) drug therapy: Secondary | ICD-10-CM | POA: Diagnosis not present

## 2019-05-25 DIAGNOSIS — E119 Type 2 diabetes mellitus without complications: Secondary | ICD-10-CM | POA: Diagnosis not present

## 2019-05-25 DIAGNOSIS — K5641 Fecal impaction: Secondary | ICD-10-CM | POA: Insufficient documentation

## 2019-05-25 DIAGNOSIS — K5900 Constipation, unspecified: Secondary | ICD-10-CM | POA: Diagnosis present

## 2019-05-25 DIAGNOSIS — I10 Essential (primary) hypertension: Secondary | ICD-10-CM | POA: Insufficient documentation

## 2019-05-25 DIAGNOSIS — F0391 Unspecified dementia with behavioral disturbance: Secondary | ICD-10-CM | POA: Diagnosis not present

## 2019-05-25 LAB — CBC WITH DIFFERENTIAL/PLATELET
Abs Immature Granulocytes: 0.09 10*3/uL — ABNORMAL HIGH (ref 0.00–0.07)
Basophils Absolute: 0 10*3/uL (ref 0.0–0.1)
Basophils Relative: 0 %
Eosinophils Absolute: 0 10*3/uL (ref 0.0–0.5)
Eosinophils Relative: 0 %
HCT: 35 % — ABNORMAL LOW (ref 36.0–46.0)
Hemoglobin: 10.8 g/dL — ABNORMAL LOW (ref 12.0–15.0)
Immature Granulocytes: 1 %
Lymphocytes Relative: 10 %
Lymphs Abs: 1.3 10*3/uL (ref 0.7–4.0)
MCH: 29.8 pg (ref 26.0–34.0)
MCHC: 30.9 g/dL (ref 30.0–36.0)
MCV: 96.7 fL (ref 80.0–100.0)
Monocytes Absolute: 0.5 10*3/uL (ref 0.1–1.0)
Monocytes Relative: 4 %
Neutro Abs: 10.3 10*3/uL — ABNORMAL HIGH (ref 1.7–7.7)
Neutrophils Relative %: 85 %
Platelets: 217 10*3/uL (ref 150–400)
RBC: 3.62 MIL/uL — ABNORMAL LOW (ref 3.87–5.11)
RDW: 15 % (ref 11.5–15.5)
WBC: 12.3 10*3/uL — ABNORMAL HIGH (ref 4.0–10.5)
nRBC: 0 % (ref 0.0–0.2)

## 2019-05-25 NOTE — ED Provider Notes (Signed)
[facility] Provider Note   CSN: [id number] Arrival date & time: 05/25/19  1708     History Chief Complaint  Patient presents with  . Constipation    Stephanie [facility] is a 86 y.o. female with a h/o of right eye blindness, HTN, diabetes mellitus, and dementia who presents to the emergency department with a chief complaint of constipation.  Spoke with Stephanie [facility], patient's granddaughter who has been the patient's primary caregiver over the last few weeks.  She reports that the patient's last bowel movement was on 4/28.  The patient has had constipation before, but this is improved with prune juice.  Patient has been taking prune juice and MiraLAX over the last few days with no improvement in her symptoms.  She also reports the patient's been complaining of rectal pain due to an external hemorrhoid.  She has attempted to place hydrocortisone cream on the area, but the patient has had some loose, soft stool that keeps wiping off the medication. She was seen by primary care yesterday and was started on senokot and lactulose.   No recent fever, chills, vomiting, shortness of breath, dysuria, hematuria.  No history of impaction.  Level 5 caveat secondary to dementia.  The history is provided by medical records and the patient. No language interpreter was used.       Past Medical History:  Diagnosis Date  . 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 Diabetes mellitus without complication ([place])   . Fall at home, initial encounter 12/18/2018  . Hypertension     Patient Active Problem List   Diagnosis Date Noted  . Acute blood loss anemia 12/19/2018  . Thrombocytopenia ([place]) 12/19/2018  . Protein-calorie malnutrition, severe 12/19/2018  . Left displaced femoral neck fracture ([place]) 12/18/2018  . Fall at home, initial encounter 12/18/2018  . Dementia with behavioral disturbance ([place]) 12/18/2018  . Hypertension   . Diabetes  mellitus without complication ([place])   . 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 Malnutrition of moderate degree 02/24/2018  . Hypothermia 02/21/2018  . 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 Syncope and collapse 01/18/2017  . HTN (hypertension) 01/18/2017  . Diabetes ([place]) 01/18/2017  . Nausea vomiting and diarrhea 01/18/2017    Past Surgical History:  Procedure Laterality Date  . HIP ARTHROPLASTY Left 12/18/2018   Procedure: ARTHROPLASTY BIPOLAR HIP (HEMIARTHROPLASTY);  Surgeon: Thornton Park, MD;  Location: [facility] ORS;  Service: Orthopedics;  Laterality: Left;  . REPLACEMENT TOTAL KNEE Left      OB History   No obstetric history on file.     No family history on file.  Social History   Tobacco Use  . Smoking status: Never Smoker  . Smokeless tobacco: Former Network engineer Use Topics  . Alcohol use: No  . Drug use: No    Home Medications Prior to Admission medications   Medication Sig Start Date End Date Taking? Authorizing Provider  acetaminophen (TYLENOL) 650 MG CR tablet Take 1 tablet (650 mg total) by mouth every 8 (eight) hours as needed for pain. 12/21/18   Samuella Cota, MD  amoxicillin-clavulanate (AUGMENTIN) 875-125 MG tablet Take 1 tablet by mouth every 12 (twelve) hours. 05/26/19   Elbridge Magowan A, PA-C  aspirin 325 MG tablet Take 1 tablet (325 mg total) by mouth daily. 01/20/17   Vaughan Basta, MD  bisacodyl (DULCOLAX) 10 MG suppository Place 1 suppository (10 mg total) rectally as needed  for moderate constipation. 05/26/19   Ahri Olson A, PA-C  calcium-vitamin D (OSCAL WITH D) 500-200 MG-UNIT tablet Take 2 tablets by mouth daily with breakfast. 12/22/18   Standley BrookingGoodrich, Daniel P, MD  carvedilol (COREG) 12.5 MG tablet Take 6.25 mg by mouth 2 (two) times daily.     [provider]  donepezil (ARICEPT) 10 MG tablet Take 10 mg by mouth at bedtime. 02/06/16   [provider]  enoxaparin (LOVENOX) 30 MG/0.3ML  injection Inject 0.3 mLs (30 mg total) into the skin daily for 28 days. Continue through last day 12/28 or when advised by Dr. Nyoka LintKrasinki to stop 12/21/18 01/18/19  Standley BrookingGoodrich, Daniel P, MD  feeding supplement, GLUCERNA SHAKE, (GLUCERNA SHAKE) LIQD Take 237 mLs by mouth 3 (three) times daily between meals. 12/21/18   Standley BrookingGoodrich, Daniel P, MD  HYDROcodone-acetaminophen (NORCO/VICODIN) 5-325 MG tablet Take 1 tablet by mouth every 6 (six) hours as needed for moderate pain or severe pain. 12/21/18   Standley BrookingGoodrich, Daniel P, MD  loratadine (CLARITIN) 10 MG tablet Take 10 mg by mouth daily.    [provider]  losartan (COZAAR) 25 MG tablet Take 25 mg by mouth daily.    [provider]  metFORMIN (GLUCOPHAGE) 500 MG tablet Take 1 tablet (500 mg total) by mouth 2 (two) times daily. 02/24/18   Mayo, Allyn KennerKaty Dodd, MD  Multiple Vitamin (MULTIVITAMIN WITH MINERALS) TABS tablet Take 1 tablet by mouth daily. 12/22/18   Standley BrookingGoodrich, Daniel P, MD  polyethylene glycol (COLYTE) 240 g solution Take 4,000 mLs by mouth once for 1 dose. 05/26/19 05/26/19  Tephanie Escorcia A, PA-C  polyethylene glycol (MIRALAX / GLYCOLAX) 17 g packet Take 17 g by mouth daily as needed for mild constipation. 12/21/18   Standley BrookingGoodrich, Daniel P, MD  pravastatin (PRAVACHOL) 40 MG tablet Take 80 mg by mouth at bedtime. 01/04/16   [provider]  QUEtiapine (SEROQUEL) 50 MG tablet Take 1 tablet (50 mg total) by mouth at bedtime. 10/18/18   Loleta RoseForbach, Cory, MD  sertraline (ZOLOFT) 25 MG tablet Take 25 mg by mouth 2 (two) times daily. Taking 25 mg in the morning and 25 mg (with 50 mg) at bedtime. 12/14/18   [provider]  traZODone (DESYREL) 50 MG tablet Take 50 mg by mouth at bedtime as needed for sleep.    [provider]  divalproex (DEPAKOTE SPRINKLE) 125 MG capsule Take 125-250 mg by mouth 2 (two) times daily.  10/07/18 10/18/18  [provider]  mirtazapine (REMERON) 7.5 MG tablet Take 7.5 mg by mouth at bedtime.  10/14/18  10/18/18  [provider]    Allergies    Hydrochlorothiazide w-triamterene, Simvastatin, and Verapamil  Review of Systems   Review of Systems  Unable to perform ROS: Dementia    Physical Exam Updated Vital Signs BP 124/74   Pulse 81   Temp 99.7 F (37.6 C) (Oral)   Resp 14   SpO2 99%   Physical Exam Vitals and nursing note reviewed.  Constitutional:      General: She is not in acute distress.    Appearance: She is not ill-appearing, toxic-appearing or diaphoretic.  HENT:     Head: Normocephalic.     Mouth/Throat:     Mouth: Mucous membranes are moist.     Pharynx: No oropharyngeal exudate or posterior oropharyngeal erythema.  Eyes:     General: No scleral icterus.    Extraocular Movements: Extraocular movements intact.     Conjunctiva/sclera: Conjunctivae normal.  Pupils: Pupils are equal, round, and reactive to light.  Cardiovascular:     Rate and Rhythm: Normal rate and regular rhythm.     Pulses: Normal pulses.     Heart sounds: Normal heart sounds. No murmur. No friction rub. No gallop.   Pulmonary:     Effort: Pulmonary effort is normal. No respiratory distress.     Breath sounds: No stridor. No wheezing, rhonchi or rales.  Chest:     Chest wall: No tenderness.  Abdominal:     General: There is no distension.     Palpations: Abdomen is soft. There is no mass.     Tenderness: There is no abdominal tenderness. There is no right CVA tenderness, left CVA tenderness, guarding or rebound.     Hernia: No hernia is present.     Comments: Abdomen is soft and nondistended.  Tender to palpation in the left lower quadrant.  No rebound or guarding.  Normoactive bowel sounds.  No CVA tenderness bilaterally.  No suprapubic tenderness.  Genitourinary:    Comments: Chaperoned exam.  One nonthrombosed external hemorrhoid is noted in the perianal area.  Normal rectal tone.  Soft impacted stool is felt in the rectal vault. Musculoskeletal:     Cervical back: Neck  supple.     Right lower leg: No edema.     Left lower leg: No edema.  Skin:    General: Skin is warm.     Capillary Refill: Capillary refill takes less than 2 seconds.     Coloration: Skin is not jaundiced or pale.     Findings: No rash.  Neurological:     Mental Status: She is alert.     Comments: Oriented to self  Psychiatric:        Behavior: Behavior normal.     ED Results / Procedures / Treatments   Labs (all labs ordered are listed, but only abnormal results are displayed) Labs Reviewed  CBC WITH DIFFERENTIAL/PLATELET - Abnormal; Notable for the following components:      Result Value   WBC 12.3 (*)    RBC 3.62 (*)    Hemoglobin 10.8 (*)    HCT 35.0 (*)    Neutro Abs 10.3 (*)    Abs Immature Granulocytes 0.09 (*)    All other components within normal limits  COMPREHENSIVE METABOLIC PANEL - Abnormal; Notable for the following components:   Glucose, Bld 121 (*)    Creatinine, Ser 1.07 (*)    Albumin 3.3 (*)    GFR calc non Af Amer 44 (*)    GFR calc Af Amer 51 (*)    All other components within normal limits  LACTIC ACID, PLASMA  URINALYSIS, ROUTINE W REFLEX MICROSCOPIC  LACTIC ACID, PLASMA  POC OCCULT BLOOD, ED    EKG None  Radiology DG Abdomen 1 View  Result Date: 05/25/2019 CLINICAL DATA:  Fecal impaction EXAM: ABDOMEN - 1 VIEW COMPARISON:  None. FINDINGS: The bowel gas pattern is nonobstructive. There is a large amount of stool at the level of the rectum. The patient is status post prior total hip arthroplasty on the left. There is osteopenia without evidence for an acute displaced fracture. Degenerative changes are noted of the lumbar spine. There are no definite radiopaque nephroliths. IMPRESSION: 1. Nonobstructive bowel gas pattern. 2. Large amount of stool at the level of the rectum. Electronically Signed   By: Katherine Mantle M.D.   On: 05/25/2019 22:26   CT ABDOMEN PELVIS W CONTRAST  Result Date:  05/26/2019 CLINICAL DATA:  Bowel obstruction suspected  patient impacted with leaky stool EXAM: CT ABDOMEN AND PELVIS WITH CONTRAST TECHNIQUE: Multidetector CT imaging of the abdomen and pelvis was performed using the standard protocol following bolus administration of intravenous contrast. CONTRAST:  OMNIPAQUE IOHEXOL 300 MG/ML  SOLN COMPARISON:  CT 07/25/2010 FINDINGS: Lower chest: Hazy atelectatic changes are present in the lung bases. Cardiomegaly with coronary atherosclerosis. No pericardial effusion. Hepatobiliary: No focal liver lesion. Marked prominence of the intra and extrahepatic biliary tree may be related to the post cholecystectomy state and patient's advanced age. No visible calcified gallstones. Pancreas: Prominence of the pancreatic duct also present on comparison exam though perhaps minimally increased from prior. No visible pancreatic lesion or peripancreatic inflammation. Spleen: There are 2 small subcentimeter hypoattenuating foci in the posterior spleen too small to fully characterize on CT imaging but statistically likely benign. Adrenals/Urinary Tract: Nodular thickening of the adrenal glands with a stable appearance of the previously characterized left adrenal adenoma. Kidneys enhance and excrete symmetrically. No suspicious renal lesion, urolithiasis or hydronephrosis. Bladder is unremarkable. Stomach/Bowel: Distal esophagus, stomach and duodenum unremarkable for and a distended appearance. No small bowel dilatation or wall thickening. Appendix is not well visualized. No focal pericecal inflammation. Proximal colon is free of acute abnormality. Scattered distal colonic diverticula without focal inflammation to suggest acute diverticulitis. There is marked distension of the distal rectum with inspissated fecal material. With circumferential rectal wall thickening and stranding in the presacral space as well as notable pelvic floor laxity likely with rectocele. Some notable hemorrhoidal enhancement is seen. Portions of the deep pelvis are  difficult to fully assess given streak artifact from the adjacent hip prosthesis. Vascular/Lymphatic: Extensive atherosclerotic calcification of the aorta and branch vessels. No visible proximal occlusions on this non angiographic technique. Major venous structures are unremarkable. Reproductive: Patient appears to be post hysterectomy. Pelvic floor laxity as above. Other: Soft tissue thickening superficial to the sacrum. Overlying skin thickening. Recommend correlation with visual inspection. Focal skin thickening in the right upper quadrant. No abdominopelvic free air. Extensive presacral edematous changes as above. Musculoskeletal: The osseous structures appear diffusely demineralized which may limit detection of small or nondisplaced fractures. No acute osseous abnormality or suspicious osseous lesion. Scoliotic curvature of the spine with levo convexity predominantly within the lumbar levels. Advanced sclerotic degenerative changes at L5-S1. Multilevel stepwise anterolisthesis L3-L5. Multilevel calcified disc protrusions resulting in moderate canal stenosis. Prior left hip hemiarthroplasty. Degenerative changes in the right hip. Features of remote osteitis pubis. IMPRESSION: 1. Marked distension of the distal rectum with inspissated fecal material. With circumferential rectal wall thickening and stranding in the presacral space as well as notable pelvic floor laxity, likely rectocele. Some notable hemorrhoidal enhancement is seen. Findings are concerning for stercoral colitis. 2. Soft tissue thickening superficial to the sacrum with overlying skin thickening. Recommend correlation with visual inspection to exclude decubitus ulceration. 3. Focal skin thickening in the right upper quadrant, possible prior surgical site. Correlate with visual inspection. 4. Marked prominence of the intra and extrahepatic biliary tree may be related to the post cholecystectomy state and patient's advanced age. Increased from prior  but without visible calcified gallstones. Correlate for acute symptoms prior to assessment with further imaging. 5. Stable left adrenal adenoma. 6. Colonic diverticulosis without evidence of acute diverticulitis. 7. Multilevel severe degenerative changes of the spine. 8. Sequela of prior osteitis pubis. 9. Aortic Atherosclerosis (ICD10-I70.0). Electronically Signed   By: Kreg Shropshire M.D.   On: 05/26/2019 01:50  Procedures Procedures (including critical care time)  Medications Ordered in ED Medications  LORazepam (ATIVAN) injection 1 mg (1 mg Intravenous Given 05/26/19 0053)  iohexol (OMNIPAQUE) 300 MG/ML solution 100 mL (100 mLs Intravenous Contrast Given 05/26/19 0109)  sodium phosphate (FLEET) 7-19 GM/118ML enema 1 enema (1 enema Rectal Given 05/26/19 0326)  LORazepam (ATIVAN) injection 1 mg (1 mg Intravenous Given 05/26/19 0351)    ED Course  I have reviewed the triage vital signs and the nursing notes.  Pertinent labs & imaging results that were available during my care of the patient were reviewed by me and considered in my medical decision making (see chart for details).    MDM Rules/Calculators/A&P                      86 year old female with a h/o of right eye blindness, HTN, diabetes mellitus, and dementia presenting with constipation.  The patient was seen and independently evaluated by Dr.  Blinda Leatherwood, attending physician.   CBC with mild leukocytosis of 12.3.  Hemoglobin is 10.8, but minimally changed from previous.  No electrolyte derangements.  Lactate is normal.  CT abdomen pelvis with marked distention of the distal rectum with fecal material.  There is also stranding noted in the presacral space as well as notable pelvic floor laxity that is likely a rectocele.  Findings are concerning for stercoral colitis.  The patient was given a Fleet enema in the ER.  Unfortunately, family was not able to be at bedside and the patient was having worsening sundowning due to dementia while in  the ER and required Ativan.  She began swatting and attempting to bite staff. Updated the patient's granddaughter, Stephanie [facility], and requested she come to bedside.  Unfortunately, she has a young child at home with no other caregiver at this time and would not be able to come to the ER for many hours until after she has taken her child to school.  Discussed that we would attempt to disimpact the patient 1 additional time, but if she remain agitated with attempt then given that the rest of her blood work is reassuring we could discharge her home with Augmentin and bowel evacuation regimen.  She was agreeable with plan at this time.  On reevaluation, patient remained agitated with attempts to disimpact.  Updated the patient's granddaughter.  She will come to collect her from the ER.  Advised that if the patient does not have a large bowel movement within the next few days that she should follow-up with primary care, particularly after she has completed a bowel regimen.  She was also given strict return precautions to the emergency department.  All questions answered.  She is hemodynamically stable and in no acute distress.  Safe for discharge home at this time.  Final Clinical Impression(s) / ED Diagnoses Final diagnoses:  Fecal impaction [facility])    Rx / DC Orders ED Discharge Orders         Ordered    amoxicillin-clavulanate (AUGMENTIN) 875-125 MG tablet  Every 12 hours     05/26/19 0505    polyethylene glycol (COLYTE) 240 g solution   Once     05/26/19 0505    bisacodyl (DULCOLAX) 10 MG suppository  As needed     05/26/19 0505           Dewanna Hurston A, PA-C 05/26/19 [id number]    Gilda Crease, MD 06/03/19 [id number]

## 2019-05-25 NOTE — ED Triage Notes (Signed)
Pt bib family for disimpaction. States pcp called and said that pt was impacted. Pt has been constipated for several days. Family gave stool softeners. Family reports pt now leaking some liquid stool and it is irritating her hemorrhoid.

## 2019-05-25 NOTE — ED Notes (Signed)
Pt's family member inquiring about recticare for patient to use at home.

## 2019-05-26 ENCOUNTER — Emergency Department (HOSPITAL_COMMUNITY): Payer: Medicare Other

## 2019-05-26 DIAGNOSIS — K5641 Fecal impaction: Secondary | ICD-10-CM | POA: Diagnosis not present

## 2019-05-26 LAB — COMPREHENSIVE METABOLIC PANEL
ALT: 19 U/L (ref 0–44)
AST: 22 U/L (ref 15–41)
Albumin: 3.3 g/dL — ABNORMAL LOW (ref 3.5–5.0)
Alkaline Phosphatase: 66 U/L (ref 38–126)
Anion gap: 13 (ref 5–15)
BUN: 20 mg/dL (ref 8–23)
CO2: 24 mmol/L (ref 22–32)
Calcium: 9.4 mg/dL (ref 8.9–10.3)
Chloride: 103 mmol/L (ref 98–111)
Creatinine, Ser: 1.07 mg/dL — ABNORMAL HIGH (ref 0.44–1.00)
GFR calc Af Amer: 51 mL/min — ABNORMAL LOW (ref >60)
GFR calc non Af Amer: 44 mL/min — ABNORMAL LOW (ref >60)
Glucose, Bld: 121 mg/dL — ABNORMAL HIGH (ref 70–99)
Potassium: 4.2 mmol/L (ref 3.5–5.1)
Sodium: 140 mmol/L (ref 135–145)
Total Bilirubin: 0.7 mg/dL (ref 0.3–1.2)
Total Protein: 6.7 g/dL (ref 6.5–8.1)

## 2019-05-26 LAB — POC OCCULT BLOOD, ED: Fecal Occult Bld: NEGATIVE

## 2019-05-26 LAB — LACTIC ACID, PLASMA: Lactic Acid, Venous: 1.6 mmol/L (ref 0.5–1.9)

## 2019-05-26 MED ORDER — PEG 3350-KCL-NABCB-NACL-NASULF 240 G PO SOLR: 4000.0000 mL | Freq: Once | ORAL | 0 refills | Status: AC

## 2019-05-26 MED ORDER — BISACODYL 10 MG RE SUPP: 10.0000 mg | RECTAL | 0 refills | Status: DC | PRN

## 2019-05-26 MED ORDER — AMOXICILLIN-POT CLAVULANATE 875-125 MG PO TABS: 1.0000 | ORAL_TABLET | Freq: Two times a day (BID) | ORAL | 0 refills | Status: DC

## 2019-05-26 MED ORDER — LORAZEPAM 2 MG/ML IJ SOLN
INTRAMUSCULAR | Status: AC
  Administered 2019-05-26: 1 mg via INTRAVENOUS
  Filled 2019-05-26: qty 1

## 2019-05-26 MED ORDER — LORAZEPAM 2 MG/ML IJ SOLN
1.0000 mg | Freq: Once | INTRAMUSCULAR | Status: AC
  Administered 2019-05-26: 1 mg via INTRAVENOUS
  Filled 2019-05-26: qty 1

## 2019-05-26 MED ORDER — LORAZEPAM 2 MG/ML IJ SOLN: 1.0000 mg | Freq: Once | INTRAMUSCULAR | Status: AC

## 2019-05-26 MED ORDER — IOHEXOL 300 MG/ML  SOLN
100.0000 mL | Freq: Once | INTRAMUSCULAR | Status: AC | PRN
  Administered 2019-05-26: 100 mL via INTRAVENOUS

## 2019-05-26 MED ORDER — FLEET ENEMA 7-19 GM/118ML RE ENEM
1.0000 | ENEMA | Freq: Once | RECTAL | Status: AC
  Administered 2019-05-26: 1 via RECTAL
  Filled 2019-05-26: qty 1

## 2019-05-26 NOTE — ED Notes (Signed)
This EMT was called to the pts room by other members of the staff. Pt was actively attempting to bite, hit, scratch, and kick staff. This EMT assisted in restraining the pt for enema administration to prevent the pt injuring herself and other members of the staff. PA called to bedside to assist. Verbal de-escalation was unsuccessful as the pt was very disoriented. Pts sheets were changed and clean briefs put on pt despite her agitation. Multiple RNs and techs were at bedside with the PA to care for the pt. Comfort and safety measures in place for the pt at this time.

## 2019-05-26 NOTE — ED Notes (Signed)
Patient verbalizes understanding of discharge instructions. Opportunity for questioning and answers were provided. Armband removed by staff, pt discharged from ED via wheelchair with family.  

## 2019-05-26 NOTE — ED Notes (Signed)
Pt increasingly combative, uncooperative and verbally aggressive towards staff. Danger to self and others at this time. Pt kicking staff in face, scratching, yelling and cursing. Pt states, "turn me loose mother fuckers! I'll go get my shotgun!" McDonaldPA at bedside to assess pt. Verbal order for 1mg  Ativan.

## 2019-05-26 NOTE — Discharge Instructions (Addendum)
Thank you for allowing me to care for you today in the Emergency Department.   Continue MiraLAX at home as prescribed.  Make sure [place] is drinking plenty of water (try to drink 64 ounces of water/drinks with electrolytes daily, especially over the next few days)  COLYTE, polyethylene glycol, can help with impaction.  Use as directed.  Place 1 bisacodyl suppository in the rectum daily as needed for constipation.  Take 1 tablet of Augmentin every 12 hours for the next week.  If you do not have a large bowel movement with this regimen in the next few days, follow up with primary care.   Return to the emergency department if she develops uncontrollable vomiting, if she stops passing stool and also stops passing gas, she developed severe abdominal pain with a big, swollen abdomen, or other new, concerning symptoms.

## 2019-05-27 ENCOUNTER — Telehealth: Payer: Self-pay

## 2019-05-27 NOTE — Telephone Encounter (Signed)
Called to clarify instructions for go lightly assisted in clarification

## 2019-06-07 ENCOUNTER — Other Ambulatory Visit: Payer: Self-pay

## 2019-06-07 ENCOUNTER — Other Ambulatory Visit: Payer: Medicare Other | Admitting: Nurse Practitioner

## 2019-06-09 ENCOUNTER — Other Ambulatory Visit: Payer: Self-pay

## 2019-06-09 ENCOUNTER — Other Ambulatory Visit: Payer: Medicare Other | Admitting: Nurse Practitioner

## 2019-07-11 ENCOUNTER — Other Ambulatory Visit: Payer: Medicare Other | Admitting: Nurse Practitioner

## 2019-07-11 ENCOUNTER — Encounter: Payer: Self-pay | Admitting: Nurse Practitioner

## 2019-07-11 ENCOUNTER — Other Ambulatory Visit: Payer: Self-pay

## 2019-07-11 DIAGNOSIS — F0390 Unspecified dementia without behavioral disturbance: Secondary | ICD-10-CM

## 2019-07-11 DIAGNOSIS — Z515 Encounter for palliative care: Secondary | ICD-10-CM

## 2019-07-11 NOTE — Progress Notes (Signed)
[facility] Consult Note Telephone: [phone]  Fax: [phone]  PATIENT NAME: Stephanie Graham DOB: Aug 10, 1921 MRN: [id number]  PRIMARY CARE PROVIDER:   Dion Body, MD  REFERRING PROVIDER:  Dion Body, MD [facility] [place],  [place] [address] RESPONSIBLE PARTY:[name] daughter. [phone]  RECOMMENDATIONS and PLAN: 1.TKZ:[id number] to DNR, placed in [facility],received MOST form, [facility] book, wants to further review with family, will revisit at next [facility] visit or sooner if family decides earlier completion  2.Palliative care encounter; Palliative medicine team will continue to support patient, patient's family, and medical team. Visit consisted of counseling and education dealing with the complex and emotionally intense issues of symptom management and palliative care in the setting of serious and potentially life-threatening illness I spent 60 minutes providing this consultation,  from 3:30pm to 4:30pm. More than 50% of the time in this consultation was spent coordinating communication.   HISTORY OF PRESENT ILLNESS:  Stephanie Graham is a 86 y.o. year old female with multiple medical problems including CVA, systolic congestive heart failure (ef 40% 3 / 2013). dementia with behavioral disturbances, hypertension, diabetes, thrombocytopenia, protein calorie malnutrition, anemia, blindness right eye.  Face-to-face in-person visit with [name] and her daughter [name]. We talked about purpose of palliative care visit. [name] and agreement and [name] of attorney. We talked about how [name] has been feeling lately. [name] endorses she does not seem to be experiencing pain. Her appetite remains declined and they continue to give her supplements with Glucerna. [name] endorse has concern for pureed foods and getting choked. We talked about Dentures.  [name] endorses she took [name] to get fitted for dentures in the dentist chair with her that it was going to be 6 months. [name] endorses she is looking into other options. We talked about food choices. We talked about muscle wasting and atrophy. [name] endorses her weight has remained about the same 100 lb. No new changes in clothes size. We talked about [facility] restarting therapy. [name] is walking with her walker and assistance. [name] talked about the therapist that has been working with her and is very grateful as he has been explaining more about dementia, [name] functional abilities. We talked about realistic expectations with progression of dementia. We talked about no recent falls, wounds, infections. [name] talked about [name] urinating more frequently. She was recently tested with negative for UTI. We talked about bladder training. We talked about hydration. We talked about confusion in the evenings, sundowning. We talked about behavior. We talked about redirection. We talked about sleeping. [name] does get up during the night to use the bathroom. [name] endorses that she may eventually be looking at placement. [name] daughter who helps her care for [name] is getting ready to go back to school. [name] asked if there was a Education officer, museum with palliative care available to answer questions and help with more resources. Discuss with [name] will do referral for palliative care social worker to contact her by phone. We talked about medical goals of care. We talked about caregiver fatigue in the importance of self-care. We talked about role of palliative care and plan of care. Discuss palliative care follow-up visit in 4 weeks after therapy is completed. Jeanette in agreement an appointment scheduled. Therapeutic was singing emotional support provided. Contact information. Questions answered to satisfaction.  Currently getting home health therapy; will measure  again at next visit when therapy  completed   Lt arm 6 1/2 inches Lt leg 11 inches  Palliative Care was asked to help to continue to address goals of care.   CODE STATUS: DNR  PPS: 40% HOSPICE ELIGIBILITY/DIAGNOSIS: TBD  PAST MEDICAL HISTORY:  Past Medical History:  Diagnosis Date  . 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 Diabetes mellitus without complication (HCC)   . Fall at home, initial encounter 12/18/2018  . Hypertension     SOCIAL HX:  Social History   Tobacco Use  . Smoking status: Never Smoker  . Smokeless tobacco: Former Engineer, water Use Topics  . Alcohol use: No    ALLERGIES:  Allergies  Allergen Reactions  . Hydrochlorothiazide W-Triamterene     Other reaction(s): Unknown  . Simvastatin     Other reaction(s): Unknown  . Verapamil     Other reaction(s): Unknown     PERTINENT MEDICATIONS:  Outpatient Encounter Medications as of 07/11/2019  Medication Sig  . acetaminophen (TYLENOL) 650 MG CR tablet Take 1 tablet (650 mg total) by mouth every 8 (eight) hours as needed for pain.  Marland Kitchen amoxicillin-clavulanate (AUGMENTIN) 875-125 MG tablet Take 1 tablet by mouth every 12 (twelve) hours.  Marland Kitchen aspirin 325 MG tablet Take 1 tablet (325 mg total) by mouth daily.  . bisacodyl (DULCOLAX) 10 MG suppository Place 1 suppository (10 mg total) rectally as needed for moderate constipation.  . calcium-vitamin D (OSCAL WITH D) 500-200 MG-UNIT tablet Take 2 tablets by mouth daily with breakfast.  . carvedilol (COREG) 12.5 MG tablet Take 6.25 mg by mouth 2 (two) times daily.   Marland Kitchen donepezil (ARICEPT) 10 MG tablet Take 10 mg by mouth at bedtime.  . enoxaparin (LOVENOX) 30 MG/0.3ML injection Inject 0.3 mLs (30 mg total) into the skin daily for 28 days. Continue through last day 12/28 or when advised by Dr. Nyoka Lint to stop  . feeding supplement, GLUCERNA SHAKE, (GLUCERNA SHAKE) LIQD Take 237 mLs by mouth 3 (three) times daily between meals.  Marland Kitchen  HYDROcodone-acetaminophen (NORCO/VICODIN) 5-325 MG tablet Take 1 tablet by mouth every 6 (six) hours as needed for moderate pain or severe pain.  Marland Kitchen loratadine (CLARITIN) 10 MG tablet Take 10 mg by mouth daily.  Marland Kitchen losartan (COZAAR) 25 MG tablet Take 25 mg by mouth daily.  . metFORMIN (GLUCOPHAGE) 500 MG tablet Take 1 tablet (500 mg total) by mouth 2 (two) times daily.  . Multiple Vitamin (MULTIVITAMIN WITH MINERALS) TABS tablet Take 1 tablet by mouth daily.  . polyethylene glycol (MIRALAX / GLYCOLAX) 17 g packet Take 17 g by mouth daily as needed for mild constipation.  . pravastatin (PRAVACHOL) 40 MG tablet Take 80 mg by mouth at bedtime.  Marland Kitchen QUEtiapine (SEROQUEL) 50 MG tablet Take 1 tablet (50 mg total) by mouth at bedtime.  . sertraline (ZOLOFT) 25 MG tablet Take 25 mg by mouth 2 (two) times daily. Taking 25 mg in the morning and 25 mg (with 50 mg) at bedtime.  . traZODone (DESYREL) 50 MG tablet Take 50 mg by mouth at bedtime as needed for sleep.  . [DISCONTINUED] divalproex (DEPAKOTE SPRINKLE) 125 MG capsule Take 125-250 mg by mouth 2 (two) times daily.   . [DISCONTINUED] mirtazapine (REMERON) 7.5 MG tablet Take 7.5 mg by mouth at bedtime.    No facility-administered encounter medications on file as of 07/11/2019.    PHYSICAL EXAM:   General: NAD, frail appearing, thin confused female Cardiovascular: regular rate and rhythm Pulmonary: clear ant fields  Extremities: no edema, no joint deformities; muscle wasting Neurological: unsteady gait  Taia Bramlett Prince [place], NP

## 2019-07-26 ENCOUNTER — Inpatient Hospital Stay
Admission: EM | Admit: 2019-07-26 | Discharge: 2019-08-01 | DRG: 082 | Disposition: A | Discharge status: Skilled Nursing Facility | Payer: Medicare Other | Attending: Internal Medicine | Admitting: Internal Medicine

## 2019-07-26 ENCOUNTER — Encounter: Payer: Self-pay | Admitting: Emergency Medicine

## 2019-07-26 ENCOUNTER — Other Ambulatory Visit: Payer: Self-pay

## 2019-07-26 ENCOUNTER — Emergency Department: Payer: Medicare Other

## 2019-07-26 DIAGNOSIS — Z888 Allergy status to other drugs, medicaments and biological substances status: Secondary | ICD-10-CM

## 2019-07-26 DIAGNOSIS — I10 Essential (primary) hypertension: Secondary | ICD-10-CM | POA: Diagnosis present

## 2019-07-26 DIAGNOSIS — F03918 Unspecified dementia, unspecified severity, with other behavioral disturbance: Secondary | ICD-10-CM | POA: Diagnosis present

## 2019-07-26 DIAGNOSIS — S065XAA Traumatic subdural hemorrhage with loss of consciousness status unknown, initial encounter: Secondary | ICD-10-CM

## 2019-07-26 DIAGNOSIS — Z96652 Presence of left artificial knee joint: Secondary | ICD-10-CM | POA: Diagnosis present

## 2019-07-26 DIAGNOSIS — R296 Repeated falls: Secondary | ICD-10-CM | POA: Diagnosis present

## 2019-07-26 DIAGNOSIS — Z7984 Long term (current) use of oral hypoglycemic drugs: Secondary | ICD-10-CM

## 2019-07-26 DIAGNOSIS — Z96642 Presence of left artificial hip joint: Secondary | ICD-10-CM | POA: Diagnosis present

## 2019-07-26 DIAGNOSIS — R443 Hallucinations, unspecified: Secondary | ICD-10-CM | POA: Diagnosis not present

## 2019-07-26 DIAGNOSIS — S065X9A Traumatic subdural hemorrhage with loss of consciousness of unspecified duration, initial encounter: Secondary | ICD-10-CM | POA: Diagnosis not present

## 2019-07-26 DIAGNOSIS — R4182 Altered mental status, unspecified: Secondary | ICD-10-CM | POA: Diagnosis present

## 2019-07-26 DIAGNOSIS — I110 Hypertensive heart disease with heart failure: Secondary | ICD-10-CM | POA: Diagnosis present

## 2019-07-26 DIAGNOSIS — Z87891 Personal history of nicotine dependence: Secondary | ICD-10-CM

## 2019-07-26 DIAGNOSIS — I5022 Chronic systolic (congestive) heart failure: Secondary | ICD-10-CM | POA: Diagnosis present

## 2019-07-26 DIAGNOSIS — H5461 Unqualified visual loss, right eye, normal vision left eye: Secondary | ICD-10-CM | POA: Diagnosis present

## 2019-07-26 DIAGNOSIS — Z72820 Sleep deprivation: Secondary | ICD-10-CM

## 2019-07-26 DIAGNOSIS — R7989 Other specified abnormal findings of blood chemistry: Secondary | ICD-10-CM | POA: Diagnosis present

## 2019-07-26 DIAGNOSIS — Z66 Do not resuscitate: Secondary | ICD-10-CM | POA: Diagnosis not present

## 2019-07-26 DIAGNOSIS — Z681 Body mass index (BMI) 19 or less, adult: Secondary | ICD-10-CM

## 2019-07-26 DIAGNOSIS — W1830XA Fall on same level, unspecified, initial encounter: Secondary | ICD-10-CM | POA: Diagnosis present

## 2019-07-26 DIAGNOSIS — E119 Type 2 diabetes mellitus without complications: Secondary | ICD-10-CM

## 2019-07-26 DIAGNOSIS — Z7982 Long term (current) use of aspirin: Secondary | ICD-10-CM

## 2019-07-26 DIAGNOSIS — N179 Acute kidney failure, unspecified: Secondary | ICD-10-CM | POA: Diagnosis present

## 2019-07-26 DIAGNOSIS — F0391 Unspecified dementia with behavioral disturbance: Secondary | ICD-10-CM | POA: Diagnosis present

## 2019-07-26 DIAGNOSIS — Z20822 Contact with and (suspected) exposure to covid-19: Secondary | ICD-10-CM | POA: Diagnosis present

## 2019-07-26 DIAGNOSIS — R441 Visual hallucinations: Secondary | ICD-10-CM | POA: Diagnosis present

## 2019-07-26 DIAGNOSIS — Z79899 Other long term (current) drug therapy: Secondary | ICD-10-CM

## 2019-07-26 DIAGNOSIS — E43 Unspecified severe protein-calorie malnutrition: Secondary | ICD-10-CM | POA: Diagnosis present

## 2019-07-26 DIAGNOSIS — G9341 Metabolic encephalopathy: Secondary | ICD-10-CM | POA: Diagnosis present

## 2019-07-26 DIAGNOSIS — Z9181 History of falling: Secondary | ICD-10-CM

## 2019-07-26 DIAGNOSIS — G9389 Other specified disorders of brain: Secondary | ICD-10-CM

## 2019-07-26 DIAGNOSIS — Z8673 Personal history of transient ischemic attack (TIA), and cerebral infarction without residual deficits: Secondary | ICD-10-CM

## 2019-07-26 DIAGNOSIS — I5020 Unspecified systolic (congestive) heart failure: Secondary | ICD-10-CM

## 2019-07-26 DIAGNOSIS — F05 Delirium due to known physiological condition: Secondary | ICD-10-CM | POA: Diagnosis present

## 2019-07-26 HISTORY — DX: Unspecified dementia, unspecified severity, without behavioral disturbance, psychotic disturbance, mood disturbance, and anxiety: F03.90

## 2019-07-26 LAB — COMPREHENSIVE METABOLIC PANEL
ALT: 20 U/L (ref 0–44)
AST: 28 U/L (ref 15–41)
Albumin: 4.2 g/dL (ref 3.5–5.0)
Alkaline Phosphatase: 73 U/L (ref 38–126)
Anion gap: 9 (ref 5–15)
BUN: 25 mg/dL — ABNORMAL HIGH (ref 8–23)
CO2: 32 mmol/L (ref 22–32)
Calcium: 9.9 mg/dL (ref 8.9–10.3)
Chloride: 101 mmol/L (ref 98–111)
Creatinine, Ser: 1.11 mg/dL — ABNORMAL HIGH (ref 0.44–1.00)
GFR calc Af Amer: 49 mL/min — ABNORMAL LOW (ref >60)
GFR calc non Af Amer: 42 mL/min — ABNORMAL LOW (ref >60)
Glucose, Bld: 139 mg/dL — ABNORMAL HIGH (ref 70–99)
Potassium: 4.5 mmol/L (ref 3.5–5.1)
Sodium: 142 mmol/L (ref 135–145)
Total Bilirubin: 0.7 mg/dL (ref 0.3–1.2)
Total Protein: 7.7 g/dL (ref 6.5–8.1)

## 2019-07-26 LAB — URINALYSIS, COMPLETE (UACMP) WITH MICROSCOPIC
Bacteria, UA: NONE SEEN
Bilirubin Urine: NEGATIVE
Glucose, UA: NEGATIVE mg/dL
Hgb urine dipstick: NEGATIVE
Ketones, ur: NEGATIVE mg/dL
Leukocytes,Ua: NEGATIVE
Nitrite: NEGATIVE
Protein, ur: NEGATIVE mg/dL
Specific Gravity, Urine: 1.014 (ref 1.005–1.030)
pH: 6 (ref 5.0–8.0)

## 2019-07-26 LAB — CBC
HCT: 37.4 % (ref 36.0–46.0)
Hemoglobin: 12 g/dL (ref 12.0–15.0)
MCH: 29.9 pg (ref 26.0–34.0)
MCHC: 32.1 g/dL (ref 30.0–36.0)
MCV: 93.3 fL (ref 80.0–100.0)
Platelets: 212 10*3/uL (ref 150–400)
RBC: 4.01 MIL/uL (ref 3.87–5.11)
RDW: 15.2 % (ref 11.5–15.5)
WBC: 5.2 10*3/uL (ref 4.0–10.5)
nRBC: 0 % (ref 0.0–0.2)

## 2019-07-26 MED ORDER — SODIUM CHLORIDE 0.9 % IV BOLUS
1000.0000 mL | Freq: Once | INTRAVENOUS | Status: AC
  Administered 2019-07-27: 1000 mL via INTRAVENOUS

## 2019-07-26 MED ORDER — HALOPERIDOL LACTATE 5 MG/ML IJ SOLN
2.5000 mg | Freq: Once | INTRAMUSCULAR | Status: AC
  Administered 2019-07-27: 2.5 mg via INTRAVENOUS
  Filled 2019-07-26: qty 1

## 2019-07-26 NOTE — ED Notes (Signed)
Pt in bed actively hallucinating. Pt speech nonsensical, talking about "God and his miracles" and pt pointing out things in her hand and on the floor.

## 2019-07-26 NOTE — ED Triage Notes (Signed)
Pt to ED from home with daughter c/o seeing snakes and ants on the floor last 3-4 days.  Daughter states worse during morning and night.  Seen at PCP today, blood work and chest xray done.  No new medicines per daughter.  Pt has hx of dementia.

## 2019-07-26 NOTE — ED Provider Notes (Signed)
[facility] Emergency Department Provider Note   ____________________________________________   First MD Initiated Contact with Patient 07/26/19 2314     (approximate)  I have reviewed the triage vital signs and the nursing notes.   HISTORY  Chief Complaint Hallucinations  Level V caveat: Limited by altered mentation  HPI Stephanie Graham is a 86 y.o. female brought to the ED from home by her family with a chief complaint of hallucinations.  Patient has a history of dementia but never hallucination.  Daughter states over the past 3 to 4 days patient has been seeing snakes and ants on the floor.  She has also been wandering more.  Saw PCP today with blood work and chest x-ray.  Denies fever, cough, chest pain, shortness of breath, abdominal pain, nausea or vomiting.  Daughter states patient fell 2 weeks ago.  Patient not on anti-coagulants.       Past Medical History:  Diagnosis Date  . 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 Dementia (HCC)   . Diabetes mellitus without complication (HCC)   . Fall at home, initial encounter 12/18/2018  . Hypertension     Patient Active Problem List   Diagnosis Date Noted  . AMS (altered mental status) 07/27/2019  . Acute blood loss anemia 12/19/2018  . Thrombocytopenia (HCC) 12/19/2018  . Protein-calorie malnutrition, severe 12/19/2018  . Left displaced femoral neck fracture (HCC) 12/18/2018  . Fall at home, initial encounter 12/18/2018  . Dementia with behavioral disturbance (HCC) 12/18/2018  . Hypertension   . Diabetes mellitus without complication (HCC)   . 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 Malnutrition of moderate degree 02/24/2018  . Hypothermia 02/21/2018  . 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 Syncope and collapse 01/18/2017  . HTN (hypertension) 01/18/2017  . Diabetes (HCC) 01/18/2017  . Nausea vomiting and diarrhea 01/18/2017    Past  Surgical History:  Procedure Laterality Date  . HIP ARTHROPLASTY Left 12/18/2018   Procedure: ARTHROPLASTY BIPOLAR HIP (HEMIARTHROPLASTY);  Surgeon: Juanell Fairly, MD;  Location: [facility] ORS;  Service: Orthopedics;  Laterality: Left;  . REPLACEMENT TOTAL KNEE Left     Prior to Admission medications   Medication Sig Start Date End Date Taking? Authorizing Provider  acetaminophen (TYLENOL) 650 MG CR tablet Take 1 tablet (650 mg total) by mouth every 8 (eight) hours as needed for pain. 12/21/18   Standley Brooking, MD  amoxicillin-clavulanate (AUGMENTIN) 875-125 MG tablet Take 1 tablet by mouth every 12 (twelve) hours. 05/26/19   McDonald, Mia A, PA-C  aspirin 325 MG tablet Take 1 tablet (325 mg total) by mouth daily. 01/20/17   Altamese Dilling, MD  bisacodyl (DULCOLAX) 10 MG suppository Place 1 suppository (10 mg total) rectally as needed for moderate constipation. 05/26/19   McDonald, Mia A, PA-C  calcium-vitamin D (OSCAL WITH D) 500-200 MG-UNIT tablet Take 2 tablets by mouth daily with breakfast. 12/22/18   Standley Brooking, MD  carvedilol (COREG) 12.5 MG tablet Take 6.25 mg by mouth 2 (two) times daily.     [provider]  donepezil (ARICEPT) 10 MG tablet Take 10 mg by mouth at bedtime. 02/06/16   [provider]  enoxaparin (LOVENOX) 30 MG/0.3ML injection Inject 0.3 mLs (30 mg total) into the skin daily for 28 days. Continue through last day 12/28 or when advised by Dr. Nyoka Lint to stop 12/21/18 01/18/19  Standley Brooking, MD  feeding supplement, GLUCERNA SHAKE, (GLUCERNA  SHAKE) LIQD Take 237 mLs by mouth 3 (three) times daily between meals. 12/21/18   Standley BrookingGoodrich, Daniel P, MD  HYDROcodone-acetaminophen (NORCO/VICODIN) 5-325 MG tablet Take 1 tablet by mouth every 6 (six) hours as needed for moderate pain or severe pain. 12/21/18   Standley BrookingGoodrich, Daniel P, MD  loratadine (CLARITIN) 10 MG tablet Take 10 mg by mouth daily.    [provider]  losartan (COZAAR) 25 MG tablet  Take 25 mg by mouth daily.    [provider]  metFORMIN (GLUCOPHAGE) 500 MG tablet Take 1 tablet (500 mg total) by mouth 2 (two) times daily. 02/24/18   Mayo, Allyn KennerKaty Dodd, MD  Multiple Vitamin (MULTIVITAMIN WITH MINERALS) TABS tablet Take 1 tablet by mouth daily. 12/22/18   Standley BrookingGoodrich, Daniel P, MD  polyethylene glycol (MIRALAX / GLYCOLAX) 17 g packet Take 17 g by mouth daily as needed for mild constipation. 12/21/18   Standley BrookingGoodrich, Daniel P, MD  pravastatin (PRAVACHOL) 40 MG tablet Take 80 mg by mouth at bedtime. 01/04/16   [provider]  QUEtiapine (SEROQUEL) 50 MG tablet Take 1 tablet (50 mg total) by mouth at bedtime. 10/18/18   Loleta RoseForbach, Cory, MD  sertraline (ZOLOFT) 25 MG tablet Take 25 mg by mouth 2 (two) times daily. Taking 25 mg in the morning and 25 mg (with 50 mg) at bedtime. 12/14/18   [provider]  traZODone (DESYREL) 50 MG tablet Take 50 mg by mouth at bedtime as needed for sleep.    [provider]  divalproex (DEPAKOTE SPRINKLE) 125 MG capsule Take 125-250 mg by mouth 2 (two) times daily.  10/07/18 10/18/18  [provider]  mirtazapine (REMERON) 7.5 MG tablet Take 7.5 mg by mouth at bedtime.  10/14/18 10/18/18  [provider]    Allergies Hydrochlorothiazide w-triamterene, Simvastatin, and Verapamil  History reviewed. No pertinent family history.  Social History Social History   Tobacco Use  . Smoking status: Never Smoker  . Smokeless tobacco: Former Engineer, waterUser  Substance Use Topics  . Alcohol use: No  . Drug use: No    Review of Systems  Constitutional: No fever/chills Eyes: No visual changes. ENT: No sore throat. Cardiovascular: Denies chest pain. Respiratory: Denies shortness of breath. Gastrointestinal: No abdominal pain.  No nausea, no vomiting.  No diarrhea.  No constipation. Genitourinary: Negative for dysuria. Musculoskeletal: Negative for back pain. Skin: Negative for rash. Neurological: Negative for headaches,  focal weakness or numbness. Psychiatric: Positive for hallucinations  ____________________________________________   PHYSICAL EXAM:  VITAL SIGNS: ED Triage Vitals [07/26/19 2027]  Enc Vitals Group     BP (!) 160/89     Pulse Rate 71     Resp 18     Temp 98.7 F (37.1 C)     Temp Source Oral     SpO2 98 %     Weight 98 lb (44.5 kg)     Height 5\' 3"  (1.6 m)     Head Circumference      Peak Flow      Pain Score      Pain Loc      Pain Edu?      Excl. in GC?     Constitutional: Alert and confused.  Elderly appearing and in no acute distress. Eyes: Conjunctivae are normal. PERRL. EOMI. Head: Atraumatic. Nose: No congestion/rhinnorhea. Mouth/Throat: Mucous membranes are moist.   Neck: No stridor.  Supple neck without meningismus. Cardiovascular: Normal rate, regular rhythm. Grossly normal heart sounds.  Good peripheral circulation. Respiratory: Normal respiratory  effort.  No retractions. Lungs CTAB. Gastrointestinal: Soft and nontender to D palpation. No distention. No abdominal bruits. No CVA tenderness. Musculoskeletal: No lower extremity tenderness nor edema.  No joint effusions. Neurologic: Alert and oriented to self.  Normal speech and language. No gross focal neurologic deficits are appreciated. MAEx4. Skin:  Skin is warm, dry and intact. No rash noted.  No petechiae. Psychiatric: Mood and affect are normal. Speech is normal.  Seeing snakes crawling on her bed rail and recoiling from them.  ____________________________________________   LABS (all labs ordered are listed, but only abnormal results are displayed)  Labs Reviewed  COMPREHENSIVE METABOLIC PANEL - Abnormal; Notable for the following components:      Result Value   Glucose, Bld 139 (*)    BUN 25 (*)    Creatinine, Ser 1.11 (*)    GFR calc non Af Amer 42 (*)    GFR calc Af Amer 49 (*)    All other components within normal limits  URINALYSIS, COMPLETE (UACMP) WITH MICROSCOPIC - Abnormal; Notable for the  following components:   Color, Urine YELLOW (*)    APPearance HAZY (*)    All other components within normal limits  CULTURE, BLOOD (ROUTINE X 2)  CULTURE, BLOOD (ROUTINE X 2)  SARS CORONAVIRUS 2 BY RT PCR (HOSPITAL ORDER, PERFORMED IN [facility])  CBC  LACTIC ACID, PLASMA  LACTIC ACID, PLASMA  BASIC METABOLIC PANEL  CBC   ____________________________________________  EKG  ED ECG REPORT I, Fawn Desrocher J, the attending physician, personally viewed and interpreted this ECG.   Date: 07/27/2019  EKG Time: 2058  Rate: 73  Rhythm: normal EKG, normal sinus rhythm  Axis: LAD  Intervals:left bundle branch block  ST&T Change: Nonspecific  ____________________________________________  RADIOLOGY  ED MD interpretation: Possible atypical pneumonia; subacute to chronic bilateral subdural hematomas without midline shift or significant mass-effect  Official radiology report(s): DG Chest 2 View  Result Date: 07/26/2019 CLINICAL DATA:  Altered mental status EXAM: CHEST - 2 VIEW COMPARISON:  01/02/2019, thoracic radiographs 03/16/2016 FINDINGS: No pleural effusion. Enlarged cardiomediastinal silhouette. Aortic atherosclerosis. Minimal patchy peripheral opacity. No pneumothorax. Age indeterminate moderate compression deformity of upper thoracic vertebra. IMPRESSION: Cardiomegaly. Minimal patchy peripheral opacity, possible atypical pneumonia. Electronically Signed   By: Jasmine Pang M.D.   On: 07/26/2019 21:21   CT Head Wo Contrast  Result Date: 07/26/2019 CLINICAL DATA:  Psychosis EXAM: CT HEAD WITHOUT CONTRAST TECHNIQUE: Contiguous axial images were obtained from the base of the skull through the vertex without intravenous contrast. COMPARISON:  CT brain 03/07/2019 FINDINGS: Brain: No acute territorial infarction or intracranial mass is visualized. Interval development of subacute to chronic appearing bilateral subdural collections, measuring up to 3 mm bilaterally at the  convexities. No midline shift. No significant mass effect. Moderate atrophy. Stable ventricle size. Advanced hypodensity in the white matter consistent with chronic small vessel ischemic change. Vascular: No hyperdense blood vessels. Carotid vascular calcification Skull: Normal. Negative for fracture or focal lesion. Sinuses/Orbits: Mucosal thickening and calcification in the right maxillary sinus. Bony remodeling of the sinus walls consistent with chronic sinus disease. Other: None IMPRESSION: 1. Interval development of subacute to chronic appearing bilateral subdural fluid collections measuring 3 mm maximum thickness at the bilateral convexities without evidence for midline shift or significant mass effect. No definite acute hemorrhage is visualized at this time. 2. Atrophy with extensive chronic small vessel ischemic change of the white matter Electronically Signed   By: Jasmine Pang M.D.   On:  07/26/2019 21:46    ____________________________________________   PROCEDURES  Procedure(s) performed (including Critical Care):  .1-3 Lead EKG Interpretation Performed by: Irean Hong, MD Authorized by: Irean Hong, MD     Interpretation: normal     ECG rate:  68   ECG rate assessment: normal     Rhythm: sinus rhythm     Ectopy: none     Conduction: normal   Comments:     Patient placed on cardiac monitor to evaluate for arrhythmias     ____________________________________________   INITIAL IMPRESSION / ASSESSMENT AND PLAN / ED COURSE  As part of my medical decision making, I reviewed the following data within the electronic MEDICAL RECORD NUMBER History obtained from family, Nursing notes reviewed and incorporated, Labs reviewed, EKG interpreted, Old chart reviewed, Radiograph reviewed, Discussed with admitting physician and Notes from prior ED visits     Stephanie Graham was evaluated in Emergency Department on 07/27/2019 for the symptoms described in the history of present illness. She  was evaluated in the context of the global COVID-19 pandemic, which necessitated consideration that the patient might be at risk for infection with the SARS-CoV-2 virus that causes COVID-19. Institutional protocols and algorithms that pertain to the evaluation of patients at risk for COVID-19 are in a state of rapid change based on information released by regulatory bodies including the CDC and federal and state organizations. These policies and algorithms were followed during the patient's care in the ED.    86 year old female presenting with hallucinations. Differential diagnosis includes, but is not limited to, alcohol, illicit or prescription medications, or other toxic ingestion; intracranial pathology such as stroke or intracerebral hemorrhage; fever or infectious causes including sepsis; hypoxemia and/or hypercarbia; uremia; trauma; endocrine related disorders such as diabetes, hypoglycemia, and thyroid-related diseases; hypertensive encephalopathy; etc.  Laboratory results unremarkable other than mild AKI.  Blood cultures and lactic acid were added.  Lactic acid normal.  Daughter does not give a history of fever or cough.  Patient is afebrile with normal white counts.  Do not feel minimal patchy peripheral opacity on chest x-ray is indicative of acute bacterial pneumonia.  Will discuss with neurosurgery regarding subdural hematoma.  Will discuss with hospitalist services for admission.  Patient attempting to get out of bed.  Sitter at bedside.  Will give low-dose IV Haldol for calming.  Once patient is medically cleared, she may need psychiatric consultation for further evaluation of visual hallucinations   Clinical Course as of Jul 27 27  Tue Jul 26, 2019  2346 Discussed case with Dr. Adriana Simas who feels patient may be admitted to our facility given chronicity of the subdural hematomas are subacute to chronic.   [JS]    Clinical Course User Index [JS] Irean Hong, MD      ____________________________________________   FINAL CLINICAL IMPRESSION(S) / ED DIAGNOSES  Final diagnoses:  Hallucinations  AKI (acute kidney injury) (HCC)  Subdural hematoma [facility])     ED Discharge Orders    None       Note:  This document was prepared using Dragon voice recognition software and may include unintentional dictation errors.   Irean Hong, MD 07/27/19 0500

## 2019-07-27 ENCOUNTER — Encounter: Payer: Self-pay | Admitting: Family Medicine

## 2019-07-27 ENCOUNTER — Telehealth: Payer: Self-pay

## 2019-07-27 ENCOUNTER — Inpatient Hospital Stay: Payer: Medicare Other

## 2019-07-27 ENCOUNTER — Other Ambulatory Visit: Payer: Self-pay

## 2019-07-27 DIAGNOSIS — E43 Unspecified severe protein-calorie malnutrition: Secondary | ICD-10-CM | POA: Diagnosis present

## 2019-07-27 DIAGNOSIS — Z681 Body mass index (BMI) 19 or less, adult: Secondary | ICD-10-CM | POA: Diagnosis not present

## 2019-07-27 DIAGNOSIS — G9389 Other specified disorders of brain: Secondary | ICD-10-CM

## 2019-07-27 DIAGNOSIS — I5022 Chronic systolic (congestive) heart failure: Secondary | ICD-10-CM | POA: Diagnosis present

## 2019-07-27 DIAGNOSIS — R296 Repeated falls: Secondary | ICD-10-CM | POA: Diagnosis present

## 2019-07-27 DIAGNOSIS — E119 Type 2 diabetes mellitus without complications: Secondary | ICD-10-CM | POA: Diagnosis present

## 2019-07-27 DIAGNOSIS — Z888 Allergy status to other drugs, medicaments and biological substances status: Secondary | ICD-10-CM | POA: Diagnosis not present

## 2019-07-27 DIAGNOSIS — Z9181 History of falling: Secondary | ICD-10-CM | POA: Diagnosis not present

## 2019-07-27 DIAGNOSIS — I110 Hypertensive heart disease with heart failure: Secondary | ICD-10-CM | POA: Diagnosis present

## 2019-07-27 DIAGNOSIS — G9341 Metabolic encephalopathy: Secondary | ICD-10-CM | POA: Diagnosis present

## 2019-07-27 DIAGNOSIS — Z8673 Personal history of transient ischemic attack (TIA), and cerebral infarction without residual deficits: Secondary | ICD-10-CM | POA: Diagnosis not present

## 2019-07-27 DIAGNOSIS — R443 Hallucinations, unspecified: Secondary | ICD-10-CM | POA: Diagnosis present

## 2019-07-27 DIAGNOSIS — H5461 Unqualified visual loss, right eye, normal vision left eye: Secondary | ICD-10-CM | POA: Diagnosis present

## 2019-07-27 DIAGNOSIS — F05 Delirium due to known physiological condition: Secondary | ICD-10-CM | POA: Diagnosis present

## 2019-07-27 DIAGNOSIS — Z96642 Presence of left artificial hip joint: Secondary | ICD-10-CM | POA: Diagnosis present

## 2019-07-27 DIAGNOSIS — Z87891 Personal history of nicotine dependence: Secondary | ICD-10-CM | POA: Diagnosis not present

## 2019-07-27 DIAGNOSIS — N179 Acute kidney failure, unspecified: Secondary | ICD-10-CM | POA: Diagnosis present

## 2019-07-27 DIAGNOSIS — Z7982 Long term (current) use of aspirin: Secondary | ICD-10-CM | POA: Diagnosis not present

## 2019-07-27 DIAGNOSIS — Z96652 Presence of left artificial knee joint: Secondary | ICD-10-CM | POA: Diagnosis present

## 2019-07-27 DIAGNOSIS — W1830XA Fall on same level, unspecified, initial encounter: Secondary | ICD-10-CM | POA: Diagnosis present

## 2019-07-27 DIAGNOSIS — Z79899 Other long term (current) drug therapy: Secondary | ICD-10-CM | POA: Diagnosis not present

## 2019-07-27 DIAGNOSIS — S065X9A Traumatic subdural hemorrhage with loss of consciousness of unspecified duration, initial encounter: Secondary | ICD-10-CM | POA: Diagnosis present

## 2019-07-27 DIAGNOSIS — Z66 Do not resuscitate: Secondary | ICD-10-CM | POA: Diagnosis not present

## 2019-07-27 DIAGNOSIS — I10 Essential (primary) hypertension: Secondary | ICD-10-CM | POA: Diagnosis not present

## 2019-07-27 DIAGNOSIS — Z7984 Long term (current) use of oral hypoglycemic drugs: Secondary | ICD-10-CM | POA: Diagnosis not present

## 2019-07-27 DIAGNOSIS — I5020 Unspecified systolic (congestive) heart failure: Secondary | ICD-10-CM

## 2019-07-27 DIAGNOSIS — R441 Visual hallucinations: Secondary | ICD-10-CM | POA: Diagnosis present

## 2019-07-27 DIAGNOSIS — R410 Disorientation, unspecified: Secondary | ICD-10-CM | POA: Diagnosis not present

## 2019-07-27 DIAGNOSIS — R4182 Altered mental status, unspecified: Secondary | ICD-10-CM | POA: Diagnosis present

## 2019-07-27 DIAGNOSIS — F0391 Unspecified dementia with behavioral disturbance: Secondary | ICD-10-CM | POA: Diagnosis present

## 2019-07-27 DIAGNOSIS — Z20822 Contact with and (suspected) exposure to covid-19: Secondary | ICD-10-CM | POA: Diagnosis present

## 2019-07-27 LAB — BASIC METABOLIC PANEL
Anion gap: 10 (ref 5–15)
BUN: 21 mg/dL (ref 8–23)
CO2: 26 mmol/L (ref 22–32)
Calcium: 8.9 mg/dL (ref 8.9–10.3)
Chloride: 106 mmol/L (ref 98–111)
Creatinine, Ser: 1.03 mg/dL — ABNORMAL HIGH (ref 0.44–1.00)
GFR calc Af Amer: 54 mL/min — ABNORMAL LOW (ref >60)
GFR calc non Af Amer: 46 mL/min — ABNORMAL LOW (ref >60)
Glucose, Bld: 170 mg/dL — ABNORMAL HIGH (ref 70–99)
Potassium: 4.1 mmol/L (ref 3.5–5.1)
Sodium: 142 mmol/L (ref 135–145)

## 2019-07-27 LAB — CBC
HCT: 35.4 % — ABNORMAL LOW (ref 36.0–46.0)
Hemoglobin: 11.4 g/dL — ABNORMAL LOW (ref 12.0–15.0)
MCH: 30.2 pg (ref 26.0–34.0)
MCHC: 32.2 g/dL (ref 30.0–36.0)
MCV: 93.7 fL (ref 80.0–100.0)
Platelets: 189 10*3/uL (ref 150–400)
RBC: 3.78 MIL/uL — ABNORMAL LOW (ref 3.87–5.11)
RDW: 15 % (ref 11.5–15.5)
WBC: 5.1 10*3/uL (ref 4.0–10.5)
nRBC: 0 % (ref 0.0–0.2)

## 2019-07-27 LAB — LACTIC ACID, PLASMA: Lactic Acid, Venous: 1.2 mmol/L (ref 0.5–1.9)

## 2019-07-27 LAB — SARS CORONAVIRUS 2 BY RT PCR (HOSPITAL ORDER, PERFORMED IN ~~LOC~~ HOSPITAL LAB): SARS Coronavirus 2: NEGATIVE

## 2019-07-27 MED ORDER — CARVEDILOL 3.125 MG PO TABS
6.2500 mg | ORAL_TABLET | Freq: Two times a day (BID) | ORAL | Status: DC
  Administered 2019-07-27 – 2019-08-01 (×12): 6.25 mg via ORAL
  Filled 2019-07-27: qty 2
  Filled 2019-07-27: qty 1
  Filled 2019-07-27 (×4): qty 2
  Filled 2019-07-27: qty 1
  Filled 2019-07-27 (×5): qty 2

## 2019-07-27 MED ORDER — HYDROCODONE-ACETAMINOPHEN 5-325 MG PO TABS: 1.0000 | ORAL_TABLET | Freq: Four times a day (QID) | ORAL | Status: DC | PRN

## 2019-07-27 MED ORDER — QUETIAPINE FUMARATE 25 MG PO TABS
50.0000 mg | ORAL_TABLET | Freq: Every day | ORAL | Status: DC
  Administered 2019-07-27 – 2019-07-31 (×6): 50 mg via ORAL
  Filled 2019-07-27 (×6): qty 2

## 2019-07-27 MED ORDER — HALOPERIDOL LACTATE 5 MG/ML IJ SOLN
5.0000 mg | Freq: Four times a day (QID) | INTRAMUSCULAR | Status: DC | PRN
  Administered 2019-07-27 – 2019-07-31 (×4): 5 mg via INTRAVENOUS
  Filled 2019-07-27 (×5): qty 1

## 2019-07-27 MED ORDER — TRAZODONE HCL 50 MG PO TABS
50.0000 mg | ORAL_TABLET | Freq: Once | ORAL | Status: AC
  Administered 2019-07-27: 50 mg via ORAL
  Filled 2019-07-27: qty 1

## 2019-07-27 MED ORDER — SERTRALINE HCL 50 MG PO TABS
25.0000 mg | ORAL_TABLET | Freq: Two times a day (BID) | ORAL | Status: DC
  Administered 2019-07-27 – 2019-08-01 (×11): 25 mg via ORAL
  Filled 2019-07-27 (×11): qty 1

## 2019-07-27 MED ORDER — ASPIRIN EC 325 MG PO TBEC
325.0000 mg | DELAYED_RELEASE_TABLET | Freq: Every day | ORAL | Status: DC
  Administered 2019-07-27: 325 mg via ORAL
  Filled 2019-07-27: qty 1

## 2019-07-27 MED ORDER — ENSURE ENLIVE PO LIQD
237.0000 mL | Freq: Two times a day (BID) | ORAL | Status: DC
  Administered 2019-07-27 – 2019-08-01 (×10): 237 mL via ORAL

## 2019-07-27 MED ORDER — PRAVASTATIN SODIUM 20 MG PO TABS
80.0000 mg | ORAL_TABLET | Freq: Every day | ORAL | Status: DC
  Administered 2019-07-27 – 2019-07-31 (×5): 80 mg via ORAL
  Filled 2019-07-27 (×4): qty 4
  Filled 2019-07-27: qty 2
  Filled 2019-07-27: qty 4

## 2019-07-27 MED ORDER — DONEPEZIL HCL 5 MG PO TABS
10.0000 mg | ORAL_TABLET | Freq: Every day | ORAL | Status: DC
  Administered 2019-07-27 – 2019-07-31 (×6): 10 mg via ORAL
  Filled 2019-07-27 (×6): qty 2

## 2019-07-27 MED ORDER — ACETAMINOPHEN 325 MG PO TABS: 650.0000 mg | ORAL_TABLET | Freq: Three times a day (TID) | ORAL | Status: DC | PRN

## 2019-07-27 NOTE — Consult Note (Addendum)
Referring Physician:  No referring provider defined for this encounter.  Primary Physician:  Marisue Ivan, MD  Chief Complaint:  SDH  History of Present Illness: Stephanie Graham is a 86 y.o. female with radiographic evidence of possible subacute SDH. Pt is asleep and therefore history is obtained from chart review. Per MD Sung's note from 07/26/2019, "Stephanie Graham is a 86 y.o. female brought to the ED from home by her family with a chief complaint of hallucinations.  Patient has a history of dementia but never hallucination.  Daughter states over the past 3 to 4 days patient has been seeing snakes and ants on the floor.  She has also been wandering more.  Saw PCP today with blood work and chest x-ray.  Denies fever, cough, chest pain, shortness of breath, abdominal pain, nausea or vomiting.  Daughter states patient fell 2 weeks ago.  Patient not on anti-coagulants."  On my exam, she had just received Haldol x 2, Seroquel, and trazodone, reportedly for agitation, and only awakens to noxious stimuli.   Per sitter, she had been agitated all night and was reportedly moving all extremities.    Review of Systems:  A 10 point review of systems is negative, except for the pertinent positives and negatives detailed in the HPI.  Past Medical History: Past Medical History:  Diagnosis Date  . 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 Dementia (HCC)   . Diabetes mellitus without complication (HCC)   . Fall at home, initial encounter 12/18/2018  . Hypertension     Past Surgical History: Past Surgical History:  Procedure Laterality Date  . HIP ARTHROPLASTY Left 12/18/2018   Procedure: ARTHROPLASTY BIPOLAR HIP (HEMIARTHROPLASTY);  Surgeon: Juanell Fairly, MD;  Location: [facility] ORS;  Service: Orthopedics;  Laterality: Left;  . REPLACEMENT TOTAL KNEE Left     Allergies: Allergies as of 07/26/2019 - Review Complete 07/26/2019  Allergen Reaction Noted  .  Hydrochlorothiazide w-triamterene  06/23/2013  . Simvastatin  06/23/2013  . Verapamil  06/23/2013    Medications:  Current Facility-Administered Medications:  .  acetaminophen (TYLENOL) tablet 650 mg, 650 mg, Oral, Q8H PRN, Tu, Ching T, DO .  aspirin EC tablet 325 mg, 325 mg, Oral, Daily, Tu, Ching T, DO .  carvedilol (COREG) tablet 6.25 mg, 6.25 mg, Oral, BID, Tu, Ching T, DO, 6.25 mg at 07/27/19 0210 .  donepezil (ARICEPT) tablet 10 mg, 10 mg, Oral, QHS, Tu, Ching T, DO, 10 mg at 07/27/19 0209 .  haloperidol lactate (HALDOL) injection 5 mg, 5 mg, Intravenous, Q6H PRN, Manuela Schwartz, NP, 5 mg at 07/27/19 0402 .  pravastatin (PRAVACHOL) tablet 80 mg, 80 mg, Oral, q1800, Tu, Ching T, DO .  QUEtiapine (SEROQUEL) tablet 50 mg, 50 mg, Oral, QHS, Tu, Ching T, DO, 50 mg at 07/27/19 0209 .  sertraline (ZOLOFT) tablet 25 mg, 25 mg, Oral, BID, Tu, Ching T, DO  Current Outpatient Medications:  .  acetaminophen (TYLENOL) 650 MG CR tablet, Take 1 tablet (650 mg total) by mouth every 8 (eight) hours as needed for pain., Disp: , Rfl:  .  amoxicillin-clavulanate (AUGMENTIN) 875-125 MG tablet, Take 1 tablet by mouth every 12 (twelve) hours., Disp: 14 tablet, Rfl: 0 .  aspirin 325 MG tablet, Take 1 tablet (325 mg total) by mouth daily., Disp: 30 tablet, Rfl: 1 .  bisacodyl (DULCOLAX) 10 MG suppository, Place 1 suppository (10 mg total) rectally as needed for moderate constipation., Disp: 12 suppository, Rfl: 0 .  calcium-vitamin  D (OSCAL WITH D) 500-200 MG-UNIT tablet, Take 2 tablets by mouth daily with breakfast., Disp: , Rfl:  .  carvedilol (COREG) 12.5 MG tablet, Take 6.25 mg by mouth 2 (two) times daily. , Disp: , Rfl: 0 .  donepezil (ARICEPT) 10 MG tablet, Take 10 mg by mouth at bedtime., Disp: , Rfl: 0 .  enoxaparin (LOVENOX) 30 MG/0.3ML injection, Inject 0.3 mLs (30 mg total) into the skin daily for 28 days. Continue through last day 12/28 or when advised by Dr. Nyoka Lint to stop, Disp: , Rfl: 0 .   feeding supplement, GLUCERNA SHAKE, (GLUCERNA SHAKE) LIQD, Take 237 mLs by mouth 3 (three) times daily between meals., Disp: , Rfl: 0 .  HYDROcodone-acetaminophen (NORCO/VICODIN) 5-325 MG tablet, Take 1 tablet by mouth every 6 (six) hours as needed for moderate pain or severe pain., Disp: 20 tablet, Rfl: 0 .  loratadine (CLARITIN) 10 MG tablet, Take 10 mg by mouth daily., Disp: , Rfl:  .  losartan (COZAAR) 25 MG tablet, Take 25 mg by mouth daily., Disp: , Rfl:  .  metFORMIN (GLUCOPHAGE) 500 MG tablet, Take 1 tablet (500 mg total) by mouth 2 (two) times daily., Disp: 60 tablet, Rfl: 0 .  Multiple Vitamin (MULTIVITAMIN WITH MINERALS) TABS tablet, Take 1 tablet by mouth daily., Disp: , Rfl:  .  polyethylene glycol (MIRALAX / GLYCOLAX) 17 g packet, Take 17 g by mouth daily as needed for mild constipation., Disp: , Rfl:  .  pravastatin (PRAVACHOL) 40 MG tablet, Take 80 mg by mouth at bedtime., Disp: , Rfl: 0 .  QUEtiapine (SEROQUEL) 50 MG tablet, Take 1 tablet (50 mg total) by mouth at bedtime., Disp: 30 tablet, Rfl: 2 .  sertraline (ZOLOFT) 25 MG tablet, Take 25 mg by mouth 2 (two) times daily. Taking 25 mg in the morning and 25 mg (with 50 mg) at bedtime., Disp: , Rfl:  .  traZODone (DESYREL) 50 MG tablet, Take 50 mg by mouth at bedtime as needed for sleep., Disp: , Rfl:    Social History: Social History   Tobacco Use  . Smoking status: Never Smoker  . Smokeless tobacco: Former Engineer, water Use Topics  . Alcohol use: No  . Drug use: No    Family Medical History: History reviewed. No pertinent family history.  Physical Examination: Vitals:   07/26/19 2027 07/27/19 0822  BP: (!) 160/89   Pulse: 71   Resp: 18 17  Temp: 98.7 F (37.1 C) (!) 97.2 F (36.2 C)  SpO2: 98%      General: Pt asleep.  Psychiatric: UTA  Head:  normocephalic   Respiratory: Patient is breathing without any difficulty, RRR    NEUROLOGICAL:  General: Asleep, awakens to noxious stimuli.   Unable  to obtain neurologic exam given that she received haldol x 2, Seroquel, and Trazodone for agitation prior to my exam. Will need to return to evaluate.    Imaging: CT head 07/26/2019:  FINDINGS: Brain: No acute territorial infarction or intracranial mass is visualized. Interval development of subacute to chronic appearing bilateral subdural collections, measuring up to 3 mm bilaterally at the convexities. No midline shift. No significant mass effect. Moderate atrophy. Stable ventricle size. Advanced hypodensity in the white matter consistent with chronic small vessel ischemic change.  Vascular: No hyperdense blood vessels. Carotid vascular calcification  Skull: Normal. Negative for fracture or focal lesion.  Sinuses/Orbits: Mucosal thickening and calcification in the right maxillary sinus. Bony remodeling of the sinus walls consistent with chronic sinus  disease.  Other: None  IMPRESSION: 1. Interval development of subacute to chronic appearing bilateral subdural fluid collections measuring 3 mm maximum thickness at the bilateral convexities without evidence for midline shift or significant mass effect. No definite acute hemorrhage is visualized at this time. 2. Atrophy with extensive chronic small vessel ischemic change of the white matter    Assessment and Plan: Stephanie Graham is a  86 y.o. female with evidence of SDH.   As I am unable to obtain a neurologic exam due to multiple sedating medications, I will return to complete an examination.  However, recommend repeat head CT, will review and give additional recommendations at that time.  Defer use of ASA to primary team.  No acute neurosurgical intervention indicated at this time.   Discussed with Dr. Adriana Simas.    Patsey Berthold, NP Dept. of Neurosurgery  Addendum: Reevaluated patient at 1100am. She is awake, alert, but disoriented to time and person. Knows she is in a hospital bed. Moving all extremities with  equal strength bilaterally. No drift. No focal deficits. PERRLA, EOMI. Difficult to get patient to follow commands consistently, unsure if it is because she is HOH or due to her dementia, however, she is able to follow some commands when asked, such as raising arms and legs off bed.   Speech is clear although she is edentulous.   Repeat head CT with stable small bilateral subdural collections.   No acute neurosurgical intervention indicated.

## 2019-07-27 NOTE — ED Notes (Signed)
ED TO INPATIENT HANDOFF REPORT  ED Nurse Name and Phone #: [phone]  S Name/Age/Gender Rollene Rotunda 86 y.o. female Room/Bed: ED05A/ED05A  Code Status   Code Status: Full Code  Home/SNF/Other Home Patient oriented to: self Is this baseline? Yes   Triage Complete: Triage complete  Chief Complaint AMS (altered mental status) [R41.82]  Triage Note Pt to ED from home with daughter c/o seeing snakes and ants on the floor last 3-4 days.  Daughter states worse during morning and night.  Seen at PCP today, blood work and chest xray done.  No new medicines per daughter.  Pt has hx of dementia.    Allergies Allergies  Allergen Reactions  . Hydrochlorothiazide W-Triamterene     Other reaction(s): Unknown  . Simvastatin     Other reaction(s): Unknown  . Verapamil     Other reaction(s): Unknown    Level of Care/Admitting Diagnosis ED Disposition    ED Disposition Condition Comment   Admit  [facility] [[id number][facility]  Level of Care: Med-Surg [16]  Covid Evaluation: Asymptomatic Screening Protocol (No Symptoms)  Diagnosis: AMS (altered mental status) [6213086]  Admitting Physician: Anselm Jungling [[id number]]  Attending Physician: Anselm Jungling [[id number]]  Estimated length of stay: past midnight tomorrow  Certification:: I certify this patient will need inpatient services for at least 2 midnights       B Medical/Surgery History Past Medical History:  Diagnosis Date  . 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 Dementia (HCC)   . Diabetes mellitus without complication (HCC)   . Fall at home, initial encounter 12/18/2018  . Hypertension    Past Surgical History:  Procedure Laterality Date  . HIP ARTHROPLASTY Left 12/18/2018   Procedure: ARTHROPLASTY BIPOLAR HIP (HEMIARTHROPLASTY);  Surgeon: Juanell Fairly, MD;  Location: [facility] ORS;  Service: Orthopedics;  Laterality: Left;  . REPLACEMENT TOTAL KNEE Left      A IV  Location/Drains/Wounds Patient Lines/Drains/Airways Status    Active Line/Drains/Airways    Name Placement date Placement time Site Days   Peripheral IV 07/27/19 Anterior;Right Forearm 07/27/19  0000  Forearm  less than 1   Incision (Closed) 12/18/18 Hip Left 12/18/18  1048   221          Intake/Output Last 24 hours  Intake/Output Summary (Last 24 hours) at 07/27/2019 1434 Last data filed at 07/27/2019 0212 Gross per 24 hour  Intake 1000 ml  Output --  Net 1000 ml    Labs/Imaging Results for orders placed or performed during the hospital encounter of 07/26/19 (from the past 48 hour(s))  Basic metabolic panel     Status: Abnormal   Collection Time: 07/26/19  4:01 AM  Result Value Ref Range   Sodium 142 135 - 145 mmol/L   Potassium 4.1 3.5 - 5.1 mmol/L    Comment: HEMOLYSIS AT THIS LEVEL MAY AFFECT RESULT   Chloride 106 98 - 111 mmol/L   CO2 26 22 - 32 mmol/L   Glucose, Bld 170 (H) 70 - 99 mg/dL    Comment: Glucose reference range applies only to samples taken after fasting for at least 8 hours.   BUN 21 8 - 23 mg/dL   Creatinine, Ser 3.24 (H) 0.44 - 1.00 mg/dL   Calcium 8.9 8.9 - 40.1 mg/dL   GFR calc non Af Amer 46 (L) >60 mL/min   GFR calc Af Amer 54 (L) >60 mL/min   Anion gap 10 5 - 15  Comment: Performed at [facility], [address] [place], [place] [address]  CBC     Status: Abnormal   Collection Time: 07/26/19  4:01 AM  Result Value Ref Range   WBC 5.1 4.0 - 10.5 K/uL   RBC 3.78 (L) 3.87 - 5.11 MIL/uL   Hemoglobin 11.4 (L) 12.0 - 15.0 g/dL   HCT 60.435.4 (L) 36 - 46 %   MCV 93.7 80.0 - 100.0 fL   MCH 30.2 26.0 - 34.0 pg   MCHC 32.2 30.0 - 36.0 g/dL   RDW 54.015.0 98.111.5 - 19.115.5 %   Platelets 189 150 - 400 K/uL   nRBC 0.0 0.0 - 0.2 %    Comment: Performed at [facility], [address] [place], [place] [address]  CBC     Status: None   Collection Time: 07/26/19  8:53 PM  Result Value Ref Range   WBC 5.2 4.0 - 10.5 K/uL   RBC 4.01 3.87 - 5.11  MIL/uL   Hemoglobin 12.0 12.0 - 15.0 g/dL   HCT 56.237.4 36 - 46 %   MCV 93.3 80.0 - 100.0 fL   MCH 29.9 26.0 - 34.0 pg   MCHC 32.1 30.0 - 36.0 g/dL   RDW 13.015.2 86.511.5 - 78.415.5 %   Platelets 212 150 - 400 K/uL   nRBC 0.0 0.0 - 0.2 %    Comment: Performed at [facility], [address] [place], [place] [address]  Comprehensive metabolic panel     Status: Abnormal   Collection Time: 07/26/19  8:53 PM  Result Value Ref Range   Sodium 142 135 - 145 mmol/L   Potassium 4.5 3.5 - 5.1 mmol/L   Chloride 101 98 - 111 mmol/L   CO2 32 22 - 32 mmol/L   Glucose, Bld 139 (H) 70 - 99 mg/dL    Comment: Glucose reference range applies only to samples taken after fasting for at least 8 hours.   BUN 25 (H) 8 - 23 mg/dL   Creatinine, Ser 5.281.11 (H) 0.44 - 1.00 mg/dL   Calcium 9.9 8.9 - 41.310.3 mg/dL   Total Protein 7.7 6.5 - 8.1 g/dL   Albumin 4.2 3.5 - 5.0 g/dL   AST 28 15 - 41 U/L   ALT 20 0 - 44 U/L   Alkaline Phosphatase 73 38 - 126 U/L   Total Bilirubin 0.7 0.3 - 1.2 mg/dL   GFR calc non Af Amer 42 (L) >60 mL/min   GFR calc Af Amer 49 (L) >60 mL/min   Anion gap 9 5 - 15    Comment: Performed at [facility], [address] [place], [place] [address]  Urinalysis, Complete w Microscopic     Status: Abnormal   Collection Time: 07/26/19  8:54 PM  Result Value Ref Range   Color, Urine YELLOW (A) YELLOW   APPearance HAZY (A) CLEAR   Specific Gravity, Urine 1.014 1.005 - 1.030   pH 6.0 5.0 - 8.0   Glucose, UA NEGATIVE NEGATIVE mg/dL   Hgb urine dipstick NEGATIVE NEGATIVE   Bilirubin Urine NEGATIVE NEGATIVE   Ketones, ur NEGATIVE NEGATIVE mg/dL   Protein, ur NEGATIVE NEGATIVE mg/dL   Nitrite NEGATIVE NEGATIVE   Leukocytes,Ua NEGATIVE NEGATIVE   WBC, UA 0-5 0 - 5 WBC/hpf   Bacteria, UA NONE SEEN NONE SEEN   Squamous Epithelial / LPF 0-5 0 - 5   Hyaline Casts, UA PRESENT     Comment: Performed at [facility], [address] [place], [place] [address]  Culture, blood  (routine x 2)     Status: None (Preliminary result)   Collection Time: 07/26/19 11:54 PM   Specimen: BLOOD RIGHT FOREARM  Result Value Ref Range   Specimen Description BLOOD RIGHT FOREARM    Special Requests      BOTTLES DRAWN AEROBIC AND ANAEROBIC Blood Culture adequate volume   Culture      NO GROWTH < 12 HOURS Performed at [facility], [address] [place], [place] [address]    Report Status PENDING   Culture, blood (routine x 2)     Status: None (Preliminary result)   Collection Time: 07/26/19 11:54 PM   Specimen: Right Antecubital; Blood  Result Value Ref Range   Specimen Description RIGHT ANTECUBITAL    Special Requests      BOTTLES DRAWN AEROBIC AND ANAEROBIC Blood Culture adequate volume   Culture      NO GROWTH < 12 HOURS Performed at [facility], [address], [place], [place] [address]    Report Status PENDING   Lactic acid, plasma     Status: None   Collection Time: 07/26/19 11:54 PM  Result Value Ref Range   Lactic Acid, Venous 1.2 0.5 - 1.9 mmol/L    Comment: Performed at [facility], [address] [place], [place] [address]  SARS Coronavirus 2 by RT PCR (hospital order, performed in [facility] hospital lab) Nasopharyngeal Nasopharyngeal Swab     Status: None   Collection Time: 07/26/19 11:54 PM   Specimen: Nasopharyngeal Swab  Result Value Ref Range   SARS Coronavirus 2 NEGATIVE NEGATIVE    Comment: (NOTE) SARS-CoV-2 target nucleic acids are NOT DETECTED.  The SARS-CoV-2 RNA is generally detectable in upper and lower respiratory specimens during the acute phase of infection. The lowest concentration of SARS-CoV-2 viral copies this assay can detect is 250 copies / mL. A negative result does not preclude SARS-CoV-2 infection and should not be used as the sole basis for treatment or other patient management decisions.  A negative result may occur with improper specimen collection / handling, submission of specimen  other than nasopharyngeal swab, presence of viral mutation(s) within the areas targeted by this assay, and inadequate number of viral copies (<250 copies / mL). A negative result must be combined with clinical observations, patient history, and epidemiological information.  Fact Sheet for Patients:   BoilerBrush.com.cy  Fact Sheet for Healthcare Providers: https://pope.com/  This test is not yet approved or  cleared by the [place] FDA and has been authorized for detection and/or diagnosis of SARS-CoV-2 by FDA under an Emergency Use Authorization (EUA).  This EUA will remain in effect (meaning this test can be used) for the duration of the COVID-19 declaration under Section 564(b)(1) of the Act, 21 U.S.C. section 360bbb-3(b)(1), unless the authorization is terminated or revoked sooner.  Performed at [facility], [address] [place] [place], [place] [address]    DG Chest 2 View  Result Date: 07/26/2019 CLINICAL DATA:  Altered mental status EXAM: CHEST - 2 VIEW COMPARISON:  01/02/2019, thoracic radiographs 03/16/2016 FINDINGS: No pleural effusion. Enlarged cardiomediastinal silhouette. Aortic atherosclerosis. Minimal patchy peripheral opacity. No pneumothorax. Age indeterminate moderate compression deformity of upper thoracic vertebra. IMPRESSION: Cardiomegaly. Minimal patchy peripheral opacity, possible atypical pneumonia. Electronically Signed   By: Jasmine Pang M.D.   On: 07/26/2019 21:21   CT HEAD WO CONTRAST  Result Date: 07/27/2019 CLINICAL DATA:  Encephalopathy, subdural collections, follow-up EXAM: CT  HEAD WITHOUT CONTRAST TECHNIQUE: Contiguous axial images were obtained from the base of the skull through the vertex without intravenous contrast. Sagittal and coronal MPR images reconstructed from axial data set. COMPARISON:  07/26/2019 FINDINGS: Brain: Generalized atrophy. Normal ventricular morphology. No midline shift or  mass effect. Small vessel chronic ischemic changes of deep cerebral white matter. Small BILATERAL intermediate to low-attenuation subdural collections again identified unchanged. No new intracranial hemorrhage, mass lesion or evidence of acute infarction. Vascular: Atherosclerotic calcification of internal carotid arteries bilaterally at skull base. No hyperdense vessels Skull: Demineralized but intact Sinuses/Orbits: Clear Other: N/A IMPRESSION: Atrophy with small vessel chronic ischemic changes of deep cerebral white matter. Stable small BILATERAL intermediate to low attenuation subdural collections, likely subacute to old in age. No acute intracranial abnormalities. Electronically Signed   By: Ulyses Southward M.D.   On: 07/27/2019 08:33   CT Head Wo Contrast  Result Date: 07/26/2019 CLINICAL DATA:  Psychosis EXAM: CT HEAD WITHOUT CONTRAST TECHNIQUE: Contiguous axial images were obtained from the base of the skull through the vertex without intravenous contrast. COMPARISON:  CT brain 03/07/2019 FINDINGS: Brain: No acute territorial infarction or intracranial mass is visualized. Interval development of subacute to chronic appearing bilateral subdural collections, measuring up to 3 mm bilaterally at the convexities. No midline shift. No significant mass effect. Moderate atrophy. Stable ventricle size. Advanced hypodensity in the white matter consistent with chronic small vessel ischemic change. Vascular: No hyperdense blood vessels. Carotid vascular calcification Skull: Normal. Negative for fracture or focal lesion. Sinuses/Orbits: Mucosal thickening and calcification in the right maxillary sinus. Bony remodeling of the sinus walls consistent with chronic sinus disease. Other: None IMPRESSION: 1. Interval development of subacute to chronic appearing bilateral subdural fluid collections measuring 3 mm maximum thickness at the bilateral convexities without evidence for midline shift or significant mass effect. No  definite acute hemorrhage is visualized at this time. 2. Atrophy with extensive chronic small vessel ischemic change of the white matter Electronically Signed   By: Jasmine Pang M.D.   On: 07/26/2019 21:46    Pending Labs Unresulted Labs (From admission, onward) Comment         None      Vitals/Pain Today's Vitals   07/26/19 2027 07/27/19 0822  BP: (!) 160/89 (!) 155/94  Pulse: 71 63  Resp: 18 17  Temp: 98.7 F (37.1 C) (!) 97.2 F (36.2 C)  TempSrc: Oral Axillary  SpO2: 98% 97%  Weight: 44.5 kg   Height: 5\' 3"  (1.6 m)     Isolation Precautions No active isolations  Medications Medications  acetaminophen (TYLENOL) tablet 650 mg (has no administration in time range)  carvedilol (COREG) tablet 6.25 mg (6.25 mg Oral Given 07/27/19 1143)  pravastatin (PRAVACHOL) tablet 80 mg (has no administration in time range)  donepezil (ARICEPT) tablet 10 mg (10 mg Oral Given 07/27/19 0209)  QUEtiapine (SEROQUEL) tablet 50 mg (50 mg Oral Given 07/27/19 0209)  sertraline (ZOLOFT) tablet 25 mg (25 mg Oral Given 07/27/19 1143)  haloperidol lactate (HALDOL) injection 5 mg (5 mg Intravenous Given 07/27/19 0402)  sodium chloride 0.9 % bolus 1,000 mL (0 mLs Intravenous Stopped 07/27/19 0212)  haloperidol lactate (HALDOL) injection 2.5 mg (2.5 mg Intravenous Given 07/27/19 0004)  traZODone (DESYREL) tablet 50 mg (50 mg Oral Given 07/27/19 0209)    Mobility walks with person assist High fall risk   Focused Assessments    R Recommendations: See Admitting Provider Note  Report given to:

## 2019-07-27 NOTE — ED Notes (Signed)
Pt cleaned up by sitter. No further needs expressed by pt.

## 2019-07-27 NOTE — ED Notes (Addendum)
Pt asleep for the first time tonight at this time

## 2019-07-27 NOTE — ED Notes (Signed)
Pt's daughter had to leave, this RN updated family on pt's condition and disposition.

## 2019-07-27 NOTE — H&P (Signed)
History and Physical    Stephanie Graham OFB:[id number] DOB: 27-Dec-1921 DOA: 07/26/2019  PCP: Marisue Ivan, MD Patient coming from: Home, lives with grand-daughter  I have personally briefly reviewed patient's old medical records in [facility]  Chief Complaint: Hallucination  HPI: Stephanie Graham is a 86 y.o. female with medical history significant for dementia, CVA, systolic CHF, hypertension and type 2 diabetes who was brought in by family for concerns of hallucination.  Patient was unable to provide history due to her dementia.  Daughter provided history over the phone.  She says that for the past week patient has not slept for about 3 to 4 days and mostly has been hallucinating at night about seeing snakes and ants.  Although sometimes she also has these hallucinations.  Patient also did not get her mirtazapine for a few nights since granddaughter who takes care of her medications got confused. About 2 weeks ago she was also noted to have a mechanical fall and family is unsure if she hit her head. They deny her having any fevers.  No nausea vomiting or diarrhea.  ED Course: She was afebrile, mildly hypertensive on room air.No leukocytosis or anemia.  No electrolyte derangements.  Creatinine mildly elevated at 1.11.  UA negative.  CT head shows interval development of subacute to chronic appearing bilateral subdural fluid collection measuring 3 mm in thickness.  ED physician Dr. Dolores Frame discussed case with neurosurgery Dr. Adriana Simas who recommended that she could get a repeat CT head in the morning but otherwise no specific intervention.  Review of Systems:  Unable to obtain due to patient's dementia altered mental status  Past Medical History:  Diagnosis Date  . 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 Dementia (HCC)   . Diabetes mellitus without complication (HCC)   . Fall at home, initial encounter 12/18/2018  . Hypertension     Past  Surgical History:  Procedure Laterality Date  . HIP ARTHROPLASTY Left 12/18/2018   Procedure: ARTHROPLASTY BIPOLAR HIP (HEMIARTHROPLASTY);  Surgeon: Juanell Fairly, MD;  Location: [facility] ORS;  Service: Orthopedics;  Laterality: Left;  . REPLACEMENT TOTAL KNEE Left      reports that she has never smoked. She has quit using smokeless tobacco. She reports that she does not drink alcohol and does not use drugs.  Allergies  Allergen Reactions  . Hydrochlorothiazide W-Triamterene     Other reaction(s): Unknown  . Simvastatin     Other reaction(s): Unknown  . Verapamil     Other reaction(s): Unknown    Unable to obtain family hx due to dementia and AMS  Prior to Admission medications   Medication Sig Start Date End Date Taking? Authorizing Provider  acetaminophen (TYLENOL) 650 MG CR tablet Take 1 tablet (650 mg total) by mouth every 8 (eight) hours as needed for pain. 12/21/18   Standley Brooking, MD  amoxicillin-clavulanate (AUGMENTIN) 875-125 MG tablet Take 1 tablet by mouth every 12 (twelve) hours. 05/26/19   McDonald, Mia A, PA-C  aspirin 325 MG tablet Take 1 tablet (325 mg total) by mouth daily. 01/20/17   Altamese Dilling, MD  bisacodyl (DULCOLAX) 10 MG suppository Place 1 suppository (10 mg total) rectally as needed for moderate constipation. 05/26/19   McDonald, Mia A, PA-C  calcium-vitamin D (OSCAL WITH D) 500-200 MG-UNIT tablet Take 2 tablets by mouth daily with breakfast. 12/22/18   Standley Brooking, MD  carvedilol (COREG) 12.5 MG tablet Take 6.25 mg by mouth 2 (two) times daily.  [provider]  donepezil (ARICEPT) 10 MG tablet Take 10 mg by mouth at bedtime. 02/06/16   [provider]  enoxaparin (LOVENOX) 30 MG/0.3ML injection Inject 0.3 mLs (30 mg total) into the skin daily for 28 days. Continue through last day 12/28 or when advised by Dr. Nyoka Lint to stop 12/21/18 01/18/19  Standley Brooking, MD  feeding supplement, GLUCERNA SHAKE, (GLUCERNA SHAKE) LIQD  Take 237 mLs by mouth 3 (three) times daily between meals. 12/21/18   Standley Brooking, MD  HYDROcodone-acetaminophen (NORCO/VICODIN) 5-325 MG tablet Take 1 tablet by mouth every 6 (six) hours as needed for moderate pain or severe pain. 12/21/18   Standley Brooking, MD  loratadine (CLARITIN) 10 MG tablet Take 10 mg by mouth daily.    [provider]  losartan (COZAAR) 25 MG tablet Take 25 mg by mouth daily.    [provider]  metFORMIN (GLUCOPHAGE) 500 MG tablet Take 1 tablet (500 mg total) by mouth 2 (two) times daily. 02/24/18   Mayo, Allyn Kenner, MD  Multiple Vitamin (MULTIVITAMIN WITH MINERALS) TABS tablet Take 1 tablet by mouth daily. 12/22/18   Standley Brooking, MD  polyethylene glycol (MIRALAX / GLYCOLAX) 17 g packet Take 17 g by mouth daily as needed for mild constipation. 12/21/18   Standley Brooking, MD  pravastatin (PRAVACHOL) 40 MG tablet Take 80 mg by mouth at bedtime. 01/04/16   [provider]  QUEtiapine (SEROQUEL) 50 MG tablet Take 1 tablet (50 mg total) by mouth at bedtime. 10/18/18   Loleta Rose, MD  sertraline (ZOLOFT) 25 MG tablet Take 25 mg by mouth 2 (two) times daily. Taking 25 mg in the morning and 25 mg (with 50 mg) at bedtime. 12/14/18   [provider]  traZODone (DESYREL) 50 MG tablet Take 50 mg by mouth at bedtime as needed for sleep.    [provider]  divalproex (DEPAKOTE SPRINKLE) 125 MG capsule Take 125-250 mg by mouth 2 (two) times daily.  10/07/18 10/18/18  [provider]  mirtazapine (REMERON) 7.5 MG tablet Take 7.5 mg by mouth at bedtime.  10/14/18 10/18/18  [provider]    Physical Exam: Vitals:   07/26/19 2027  BP: (!) 160/89  Pulse: 71  Resp: 18  Temp: 98.7 F (37.1 C)  TempSrc: Oral  SpO2: 98%  Weight: 44.5 kg  Height: 5\' 3"  (1.6 m)    Constitutional: NAD, calm, comfortable, pleasantly demented thin elderly female Vitals:   07/26/19 2027  BP: (!) 160/89  Pulse: 71  Resp: 18   Temp: 98.7 F (37.1 C)  TempSrc: Oral  SpO2: 98%  Weight: 44.5 kg  Height: 5\' 3"  (1.6 m)   Eyes: PERRL, lids and conjunctivae normal ENMT: Mucous membranes are moist.  Neck: normal, supple Respiratory: clear to auscultation bilaterally, no wheezing, no crackles. Normal respiratory effort. No accessory muscle use.  Cardiovascular: Regular rate and rhythm, no murmurs / rubs / gallops. No extremity edema. 2+ pedal pulses.  Abdomen: no tenderness, no masses palpated.  Bowel sounds positive.  Musculoskeletal: no clubbing / cyanosis. No joint deformity upper and lower extremities. Good ROM, no contractures. Normal muscle tone.  Skin: no rashes, lesions, ulcers. No induration Neurologic: CN 2-12 grossly intact.  Patient able to move all extremities and ambulate with one-person assist.  She was not able to follow strength testing commands.  Patient is oriented to self only. Psychiatric: Pleasantly demented.  Only alert and oriented to self.    Labs on  Admission: I have personally reviewed following labs and imaging studies  CBC: Recent Labs  Lab 07/26/19 2053  WBC 5.2  HGB 12.0  HCT 37.4  MCV 93.3  PLT 212   Basic Metabolic Panel: Recent Labs  Lab 07/26/19 2053  NA 142  K 4.5  CL 101  CO2 32  GLUCOSE 139*  BUN 25*  CREATININE 1.11*  CALCIUM 9.9   GFR: Estimated Creatinine Clearance: 21.3 mL/min (A) (by C-G formula based on SCr of 1.11 mg/dL (H)). Liver Function Tests: Recent Labs  Lab 07/26/19 2053  AST 28  ALT 20  ALKPHOS 73  BILITOT 0.7  PROT 7.7  ALBUMIN 4.2   No results for input(s): LIPASE, AMYLASE in the last 168 hours. No results for input(s): AMMONIA in the last 168 hours. Coagulation Profile: No results for input(s): INR, PROTIME in the last 168 hours. Cardiac Enzymes: No results for input(s): CKTOTAL, CKMB, CKMBINDEX, TROPONINI in the last 168 hours. BNP (last 3 results) No results for input(s): PROBNP in the last 8760 hours. HbA1C: No results  for input(s): HGBA1C in the last 72 hours. CBG: No results for input(s): GLUCAP in the last 168 hours. Lipid Profile: No results for input(s): CHOL, HDL, LDLCALC, TRIG, CHOLHDL, LDLDIRECT in the last 72 hours. Thyroid Function Tests: No results for input(s): TSH, T4TOTAL, FREET4, T3FREE, THYROIDAB in the last 72 hours. Anemia Panel: No results for input(s): VITAMINB12, FOLATE, FERRITIN, TIBC, IRON, RETICCTPCT in the last 72 hours. Urine analysis:    Component Value Date/Time   COLORURINE YELLOW (A) 07/26/2019 2054   APPEARANCEUR HAZY (A) 07/26/2019 2054   APPEARANCEUR Clear 12/28/2011 2247   LABSPEC 1.014 07/26/2019 2054   LABSPEC 1.013 12/28/2011 2247   PHURINE 6.0 07/26/2019 2054   GLUCOSEU NEGATIVE 07/26/2019 2054   GLUCOSEU 50 mg/dL 88/28/0034 9179   HGBUR NEGATIVE 07/26/2019 2054   BILIRUBINUR NEGATIVE 07/26/2019 2054   BILIRUBINUR Negative 12/28/2011 2247   KETONESUR NEGATIVE 07/26/2019 2054   PROTEINUR NEGATIVE 07/26/2019 2054   NITRITE NEGATIVE 07/26/2019 2054   LEUKOCYTESUR NEGATIVE 07/26/2019 2054   LEUKOCYTESUR Negative 12/28/2011 2247    Radiological Exams on Admission: DG Chest 2 View  Result Date: 07/26/2019 CLINICAL DATA:  Altered mental status EXAM: CHEST - 2 VIEW COMPARISON:  01/02/2019, thoracic radiographs 03/16/2016 FINDINGS: No pleural effusion. Enlarged cardiomediastinal silhouette. Aortic atherosclerosis. Minimal patchy peripheral opacity. No pneumothorax. Age indeterminate moderate compression deformity of upper thoracic vertebra. IMPRESSION: Cardiomegaly. Minimal patchy peripheral opacity, possible atypical pneumonia. Electronically Signed   By: Jasmine Pang M.D.   On: 07/26/2019 21:21   CT Head Wo Contrast  Result Date: 07/26/2019 CLINICAL DATA:  Psychosis EXAM: CT HEAD WITHOUT CONTRAST TECHNIQUE: Contiguous axial images were obtained from the base of the skull through the vertex without intravenous contrast. COMPARISON:  CT brain 03/07/2019 FINDINGS:  Brain: No acute territorial infarction or intracranial mass is visualized. Interval development of subacute to chronic appearing bilateral subdural collections, measuring up to 3 mm bilaterally at the convexities. No midline shift. No significant mass effect. Moderate atrophy. Stable ventricle size. Advanced hypodensity in the white matter consistent with chronic small vessel ischemic change. Vascular: No hyperdense blood vessels. Carotid vascular calcification Skull: Normal. Negative for fracture or focal lesion. Sinuses/Orbits: Mucosal thickening and calcification in the right maxillary sinus. Bony remodeling of the sinus walls consistent with chronic sinus disease. Other: None IMPRESSION: 1. Interval development of subacute to chronic appearing bilateral subdural fluid collections measuring 3 mm maximum thickness at the bilateral convexities without evidence  for midline shift or significant mass effect. No definite acute hemorrhage is visualized at this time. 2. Atrophy with extensive chronic small vessel ischemic change of the white matter Electronically Signed   By: Jasmine PangKim  Fujinaga M.D.   On: 07/26/2019 21:46      Assessment/Plan  Acute metabolic encephalopathy likely secondary to worsening dementia and subacute bilateral subdural fluid collection likely from recent fall Appears that patient has been having some mixup with her medication has not been sleeping well and noted to have some sundowning.  Also had recent fall 2 weeks ago that could have been the start of her subdural fluid collection. Will repeat CT head per neurosurgery.  No intervention at this time. Fall precaution We will have one-on-one sitter to monitor  Dementia Continue donezepil, Seroquel and Zoloft  History of CVA Continue aspirin and statin  Systolic CHF Patient appears euvolemic  Hypertension Hold losartan for now due to mildly elevated creatinine Continue carvedilol  Type 2 diabetes Controlled  DVT  prophylaxis:.Lovenox Code Status: Full Family Communication: Plan discussed with patient at bedside  disposition Plan: Home with at least 2 midnight stays  Consults called:  Admission status: inpatient  Status is: Inpatient  Remains inpatient appropriate because:Inpatient level of care appropriate due to severity of illness   Dispo: The patient is from: Home              Anticipated d/c is to: Home              Anticipated d/c date is: 2 days              Patient currently is not medically stable to d/c.         Anselm Junglinghing T Reilly Blades DO [facility]   If 7PM-7AM, please contact night-coverage [email or web address]   07/27/2019, 12:36 AM

## 2019-07-27 NOTE — ED Notes (Signed)
Pt stable. Pt took a few bites of applesauce and was finished. Pt back resting with sitter at bedside.

## 2019-07-27 NOTE — ED Notes (Signed)
Breakfast tray here for pt. Sitter at bedside to assist pt.

## 2019-07-28 LAB — BASIC METABOLIC PANEL
Anion gap: 7 (ref 5–15)
BUN: 24 mg/dL — ABNORMAL HIGH (ref 8–23)
CO2: 28 mmol/L (ref 22–32)
Calcium: 8.9 mg/dL (ref 8.9–10.3)
Chloride: 106 mmol/L (ref 98–111)
Creatinine, Ser: 1.01 mg/dL — ABNORMAL HIGH (ref 0.44–1.00)
GFR calc Af Amer: 55 mL/min — ABNORMAL LOW (ref >60)
GFR calc non Af Amer: 47 mL/min — ABNORMAL LOW (ref >60)
Glucose, Bld: 116 mg/dL — ABNORMAL HIGH (ref 70–99)
Potassium: 3.8 mmol/L (ref 3.5–5.1)
Sodium: 141 mmol/L (ref 135–145)

## 2019-07-28 LAB — CBC
HCT: 31.8 % — ABNORMAL LOW (ref 36.0–46.0)
Hemoglobin: 10.6 g/dL — ABNORMAL LOW (ref 12.0–15.0)
MCH: 30.7 pg (ref 26.0–34.0)
MCHC: 33.3 g/dL (ref 30.0–36.0)
MCV: 92.2 fL (ref 80.0–100.0)
Platelets: 178 10*3/uL (ref 150–400)
RBC: 3.45 MIL/uL — ABNORMAL LOW (ref 3.87–5.11)
RDW: 15.4 % (ref 11.5–15.5)
WBC: 3.9 10*3/uL — ABNORMAL LOW (ref 4.0–10.5)
nRBC: 0 % (ref 0.0–0.2)

## 2019-07-28 LAB — MAGNESIUM: Magnesium: 2.1 mg/dL (ref 1.7–2.4)

## 2019-07-28 MED ORDER — MIRTAZAPINE 15 MG PO TABS
15.0000 mg | ORAL_TABLET | Freq: Every day | ORAL | Status: DC
  Administered 2019-07-28 – 2019-07-31 (×4): 15 mg via ORAL
  Filled 2019-07-28 (×4): qty 1

## 2019-07-28 MED ORDER — LOSARTAN POTASSIUM 25 MG PO TABS
25.0000 mg | ORAL_TABLET | Freq: Every day | ORAL | Status: DC
  Filled 2019-07-28: qty 1

## 2019-07-28 NOTE — Progress Notes (Signed)
PROGRESS NOTE    Stephanie Graham  ZDG:[id number] DOB: Mar 07, 1921 DOA: 07/26/2019 PCP: Marisue Ivan, MD    Assessment & Plan:   Principal Problem:   AMS (altered mental status) Active Problems:   HTN (hypertension)   Diabetes (HCC)   Dementia with behavioral disturbance (HCC)   Subdural fluid collection   Systolic CHF [facility])    Stephanie Graham is a 86 y.o. female with medical history significant for dementia, CVA, systolic CHF, hypertension and type 2 diabetes who was brought in by family for concerns of hallucination.   Acute metabolic encephalopathy and visual hallucination 2/2 sleep deprivation Baseline dementia Appears that patient had been having some mixup with her medication had not been sleeping well and noted to have some sundowning.   --Pt improved after resuming home meds and having slept PLAN: --continue home donezepil, Seroquel and Zoloft and Remeron   Bilateral subdural fluid collection likely from recent fall Also had recent fall 2 weeks ago that could have been the start of her subdural fluid collection. --neurosurg consulted, repeat CT head stable PLAN: --d/c home ASA 325  History of CVA Continue statin --d/c home ASA 325 due to bleeding risk from falls  Systolic CHF Patient appears euvolemic  Hypertension BP elevated. Resume home losartan  Continue carvedilol  Type 2 diabetes Controlled  Weakness and deconditioning --PT eval today found pt barely able to walk --family to decide SNF rehab vs home with HHPT  Severe malnutrition in context of chronic illness, Underweight   DVT prophylaxis: SCD/Compression stockings Code Status: Full code  Family Communication: daughter updated on the phone today Status is: inpatient Dispo:   The patient is from: home Anticipated d/c is to: undetermined Anticipated d/c date is: 1-2 days Patient currently is medically stable to d/c.   Subjective and Interval History:  Pt reported she  slept well, no longer seeing bugs.  Eating ok.    Objective: Vitals:   07/27/19 1615 07/27/19 2012 07/28/19 0031 07/28/19 0912  BP: (!) 165/80 (!) 147/84 (!) 137/115 (!) 152/86  Pulse: 68 70 72 69  Resp: 17  16 16   Temp: 97.8 F (36.6 C) 98 F (36.7 C) 98 F (36.7 C) 97.8 F (36.6 C)  TempSrc: Oral Oral  Oral  SpO2: 100% 100% 99% 100%  Weight:      Height:        Intake/Output Summary (Last 24 hours) at 07/28/2019 1805 Last data filed at 07/28/2019 1700 Gross per 24 hour  Intake 240 ml  Output 900 ml  Net -660 ml   Filed Weights   07/26/19 2027  Weight: 44.5 kg    Examination:   Constitutional: NAD, alert, oriented to self only HEENT: conjunctivae and lids normal, EOMI CV: RRR no M,R,G. Distal pulses +2.  No cyanosis.   RESP: CTA B/L, normal respiratory effort  GI: +BS, NTND Extremities: No effusions, edema, or tenderness in BLE SKIN: warm, dry and intact Neuro: II - XII grossly intact.  Sensation intact   Data Reviewed: I have personally reviewed following labs and imaging studies  CBC: Recent Labs  Lab 07/26/19 0401 07/26/19 2053 07/28/19 0551  WBC 5.1 5.2 3.9*  HGB 11.4* 12.0 10.6*  HCT 35.4* 37.4 31.8*  MCV 93.7 93.3 92.2  PLT 189 212 178   Basic Metabolic Panel: Recent Labs  Lab 07/26/19 0401 07/26/19 2053 07/28/19 0551  NA 142 142 141  K 4.1 4.5 3.8  CL 106 101 106  CO2 26 32 28  GLUCOSE 170* 139*  116*  BUN 21 25* 24*  CREATININE 1.03* 1.11* 1.01*  CALCIUM 8.9 9.9 8.9  MG  --   --  2.1   GFR: Estimated Creatinine Clearance: 23.4 mL/min (A) (by C-G formula based on SCr of 1.01 mg/dL (H)). Liver Function Tests: Recent Labs  Lab 07/26/19 2053  AST 28  ALT 20  ALKPHOS 73  BILITOT 0.7  PROT 7.7  ALBUMIN 4.2   No results for input(s): LIPASE, AMYLASE in the last 168 hours. No results for input(s): AMMONIA in the last 168 hours. Coagulation Profile: No results for input(s): INR, PROTIME in the last 168 hours. Cardiac Enzymes: No  results for input(s): CKTOTAL, CKMB, CKMBINDEX, TROPONINI in the last 168 hours. BNP (last 3 results) No results for input(s): PROBNP in the last 8760 hours. HbA1C: No results for input(s): HGBA1C in the last 72 hours. CBG: No results for input(s): GLUCAP in the last 168 hours. Lipid Profile: No results for input(s): CHOL, HDL, LDLCALC, TRIG, CHOLHDL, LDLDIRECT in the last 72 hours. Thyroid Function Tests: No results for input(s): TSH, T4TOTAL, FREET4, T3FREE, THYROIDAB in the last 72 hours. Anemia Panel: No results for input(s): VITAMINB12, FOLATE, FERRITIN, TIBC, IRON, RETICCTPCT in the last 72 hours. Sepsis Labs: Recent Labs  Lab 07/26/19 2354  LATICACIDVEN 1.2    Recent Results (from the past 240 hour(s))  Culture, blood (routine x 2)     Status: None (Preliminary result)   Collection Time: 07/26/19 11:54 PM   Specimen: BLOOD RIGHT FOREARM  Result Value Ref Range Status   Specimen Description BLOOD RIGHT FOREARM  Final   Special Requests   Final    BOTTLES DRAWN AEROBIC AND ANAEROBIC Blood Culture adequate volume   Culture   Final    NO GROWTH 1 DAY Performed at [facility], [address], [place], [place] [address]    Report Status PENDING  Incomplete  Culture, blood (routine x 2)     Status: None (Preliminary result)   Collection Time: 07/26/19 11:54 PM   Specimen: Right Antecubital; Blood  Result Value Ref Range Status   Specimen Description RIGHT ANTECUBITAL  Final   Special Requests   Final    BOTTLES DRAWN AEROBIC AND ANAEROBIC Blood Culture adequate volume   Culture   Final    NO GROWTH 1 DAY Performed at [facility], [address] [place], [place] [address]    Report Status PENDING  Incomplete  SARS Coronavirus 2 by RT PCR (hospital order, performed in [facility] hospital lab) Nasopharyngeal Nasopharyngeal Swab     Status: None   Collection Time: 07/26/19 11:54 PM   Specimen: Nasopharyngeal Swab  Result Value Ref Range Status    SARS Coronavirus 2 NEGATIVE NEGATIVE Final    Comment: (NOTE) SARS-CoV-2 target nucleic acids are NOT DETECTED.  The SARS-CoV-2 RNA is generally detectable in upper and lower respiratory specimens during the acute phase of infection. The lowest concentration of SARS-CoV-2 viral copies this assay can detect is 250 copies / mL. A negative result does not preclude SARS-CoV-2 infection and should not be used as the sole basis for treatment or other patient management decisions.  A negative result may occur with improper specimen collection / handling, submission of specimen other than nasopharyngeal swab, presence of viral mutation(s) within the areas targeted by this assay, and inadequate number of viral copies (<250 copies / mL). A negative result must be combined with clinical observations, patient history, and epidemiological information.  Fact Sheet for Patients:  BoilerBrush.com.cy  Fact Sheet for Healthcare Providers: https://pope.com/  This test is not yet approved or  cleared by the [place] FDA and has been authorized for detection and/or diagnosis of SARS-CoV-2 by FDA under an Emergency Use Authorization (EUA).  This EUA will remain in effect (meaning this test can be used) for the duration of the COVID-19 declaration under Section 564(b)(1) of the Act, 21 U.S.C. section 360bbb-3(b)(1), unless the authorization is terminated or revoked sooner.  Performed at [facility], [address] [place], [place] [address]       Radiology Studies: DG Chest 2 View  Result Date: 07/26/2019 CLINICAL DATA:  Altered mental status EXAM: CHEST - 2 VIEW COMPARISON:  01/02/2019, thoracic radiographs 03/16/2016 FINDINGS: No pleural effusion. Enlarged cardiomediastinal silhouette. Aortic atherosclerosis. Minimal patchy peripheral opacity. No pneumothorax. Age indeterminate moderate compression deformity of upper thoracic vertebra.  IMPRESSION: Cardiomegaly. Minimal patchy peripheral opacity, possible atypical pneumonia. Electronically Signed   By: Jasmine Pang M.D.   On: 07/26/2019 21:21   CT HEAD WO CONTRAST  Result Date: 07/27/2019 CLINICAL DATA:  Encephalopathy, subdural collections, follow-up EXAM: CT HEAD WITHOUT CONTRAST TECHNIQUE: Contiguous axial images were obtained from the base of the skull through the vertex without intravenous contrast. Sagittal and coronal MPR images reconstructed from axial data set. COMPARISON:  07/26/2019 FINDINGS: Brain: Generalized atrophy. Normal ventricular morphology. No midline shift or mass effect. Small vessel chronic ischemic changes of deep cerebral white matter. Small BILATERAL intermediate to low-attenuation subdural collections again identified unchanged. No new intracranial hemorrhage, mass lesion or evidence of acute infarction. Vascular: Atherosclerotic calcification of internal carotid arteries bilaterally at skull base. No hyperdense vessels Skull: Demineralized but intact Sinuses/Orbits: Clear Other: N/A IMPRESSION: Atrophy with small vessel chronic ischemic changes of deep cerebral white matter. Stable small BILATERAL intermediate to low attenuation subdural collections, likely subacute to old in age. No acute intracranial abnormalities. Electronically Signed   By: Ulyses Southward M.D.   On: 07/27/2019 08:33   CT Head Wo Contrast  Result Date: 07/26/2019 CLINICAL DATA:  Psychosis EXAM: CT HEAD WITHOUT CONTRAST TECHNIQUE: Contiguous axial images were obtained from the base of the skull through the vertex without intravenous contrast. COMPARISON:  CT brain 03/07/2019 FINDINGS: Brain: No acute territorial infarction or intracranial mass is visualized. Interval development of subacute to chronic appearing bilateral subdural collections, measuring up to 3 mm bilaterally at the convexities. No midline shift. No significant mass effect. Moderate atrophy. Stable ventricle size. Advanced  hypodensity in the white matter consistent with chronic small vessel ischemic change. Vascular: No hyperdense blood vessels. Carotid vascular calcification Skull: Normal. Negative for fracture or focal lesion. Sinuses/Orbits: Mucosal thickening and calcification in the right maxillary sinus. Bony remodeling of the sinus walls consistent with chronic sinus disease. Other: None IMPRESSION: 1. Interval development of subacute to chronic appearing bilateral subdural fluid collections measuring 3 mm maximum thickness at the bilateral convexities without evidence for midline shift or significant mass effect. No definite acute hemorrhage is visualized at this time. 2. Atrophy with extensive chronic small vessel ischemic change of the white matter Electronically Signed   By: Jasmine Pang M.D.   On: 07/26/2019 21:46     Scheduled Meds:  carvedilol  6.25 mg Oral BID   donepezil  10 mg Oral QHS   feeding supplement (ENSURE ENLIVE)  237 mL Oral BID BM   pravastatin  80 mg Oral q1800   QUEtiapine  50 mg Oral QHS   sertraline  25 mg Oral BID  Continuous Infusions:   LOS: 1 day     Darlin Priestlyina Winefred Hillesheim, MD [facility] If 7PM-7AM, please contact night-coverage 07/28/2019, 6:05 PM

## 2019-07-28 NOTE — Progress Notes (Signed)
Initial Nutrition Assessment  DOCUMENTATION CODES:   Severe malnutrition in context of chronic illness, Underweight  INTERVENTION:  Continue Ensure Enlive po BID, each supplement provides 350 kcal and 20 grams of protein.  NUTRITION DIAGNOSIS:   Severe Malnutrition related to chronic illness (CHF, catabolic nature of previous COVID infection, dementia) as evidenced by 19.1% weight loss over 6 months, severe fat depletion, severe muscle depletion.  GOAL:   Patient will meet greater than or equal to 90% of their needs  MONITOR:   PO intake, Supplement acceptance, Labs, Weight trends, Skin, I & O's  REASON FOR ASSESSMENT:   Malnutrition Screening Tool    ASSESSMENT:   86 year old female with PMHx of dementia, DM, HTN, systolic CHF admitted with acute metabolic encephalopathy likely secondary to worsening dementia and subacute bilateral subdural fluid collection likely from recent fall.   Met with patient at bedside. She had safety sitter present. Patient was unable to provide any history in setting of dementia. Per sitter patient was eating well at meals and had eaten all of her breakfast and lunch.   Spoke with patient's daughter over the phone. She reports patient was at [employer] a while ago and caught COVID, which led to weight loss. After she lost her weight her dentures did not fit anymore and she then had difficulty chewing, which impacted her intake. Daughter reports family is working with a Pharmacist, community to get her new dentures but that it may still be several more months. They have been giving her soft foods and pureeing some foods. Discussed with daughter that patient was ordered for a dysphagia 2 (finely chopped) diet by MD and that she is tolerating it well and eating well. Daughter relieved that patient is eating well. Discussed what a fine chopped diet is. Daughter reports patient drinks Ensure or Glucerna and enjoys them. Patient is currently ordered for Ensure  Enlive.  Daughter reports patient's UBW was 122-125 lbs and that she lost approximately 19 lbs after having COVID. According to chart patient was 55 kg on 01/29/2019. Suspect weights from 2/14 and 2/19 were not accurate as they are much higher than what daughter reports UBW was. Patient is now 44.5 kg (98 lbs). She has lost 10.5 kg (19.1% body weight) over the past 6 months, which is significant for time frame.  Medications reviewed and include: Ensure Enlive BID.  Labs reviewed: BUN 24, Creatinine 1.01.  NUTRITION - FOCUSED PHYSICAL EXAM:    Most Recent Value  Orbital Region Severe depletion  Upper Arm Region Severe depletion  Thoracic and Lumbar Region Severe depletion  Buccal Region Severe depletion  Temple Region Severe depletion  Clavicle Bone Region Severe depletion  Clavicle and Acromion Bone Region Severe depletion  Scapular Bone Region Severe depletion  Dorsal Hand Severe depletion  Patellar Region Severe depletion  Anterior Thigh Region Severe depletion  Posterior Calf Region Severe depletion  Edema (RD Assessment) None  Hair Reviewed  Eyes Reviewed  Mouth Reviewed  [edentulous]  Skin Reviewed  Nails Reviewed     Diet Order:   Diet Order            DIET DYS 2 Room service appropriate? Yes; Fluid consistency: Thin  Diet effective now                EDUCATION NEEDS:   Not appropriate for education at this time  Skin:  Skin Assessment: Reviewed RN Assessment  Last BM:  Unknown  Height:   Ht Readings from Last 1  Encounters:  07/26/19 5' 3"  (1.6 m)   Weight:   Wt Readings from Last 1 Encounters:  07/26/19 44.5 kg   Ideal Body Weight:  52.3 kg  BMI:  Body mass index is 17.36 kg/m.  Estimated Nutritional Needs:   Kcal:  1300-1500  Protein:  65-75 grams  Fluid:  1.3-1.5 L/day  Jacklynn Barnacle, MS, RD, LDN Pager number available on Amion

## 2019-07-28 NOTE — Telephone Encounter (Signed)
SW received referral from Christin, NP regarding possible interest in LTC discussion. SW contacted Para March (patient's daughter) to discuss. Para March said there was a misunderstanding as Para March is not interested in LTC placement at this time. Para March is, however, interested in housing resources and requested that SW email a list of low-income housing resources. SW agreed. No additional questions at this time. SW notified NP.  Patient is currently at [facility], Para March said she is hopeful that she will discharge tomorrow to a SNF for rehab.

## 2019-07-28 NOTE — Evaluation (Signed)
Physical Therapy Evaluation Patient Details Name: Stephanie Graham MRN: [id number] DOB: January 31, 1921 Today's Date: 07/28/2019   History of Present Illness  Stephanie Graham is a 86 y/o female who was admitted for AMS. Chief complaints include hallucinating at night (seeing snakes and ants) and a mechanical fall in which it is unclear if she hit her head. PMH includes dementia, CVA, systolic CHF, DM II, blindness of R eye, and L TKR  Clinical Impression  Pt lying supine in bed with safety sitter present in room. Pt remained confused throughout session alert to name and birth date only. No family present and limited information regarding PLOF and current housing and level of assistance at d/c in charts. Pt with inconsistent responses to 1-step commands responding correctly to approx 50%. Pt performed bed mobility with mod A and mod-max A for sit <> stand transfers and side step toward HOB using RW. Pt with generalized weakness and presents with pain, confusion, and decreased functional mobility. Pt would benefit from skilled therapy to address current deficits. At this point. Pt may be better suited for LTR at discharge from acute stay secondary to current mentation to optimize return to PLOF and maximize functional mobility and independence.   Follow Up Recommendations SNF    Equipment Recommendations  Rolling walker with 5" wheels;Other (comment) (unclear of previously owned DME)    Recommendations for Other Services       Precautions / Restrictions Precautions Precautions: Fall Restrictions Weight Bearing Restrictions: No      Mobility  Bed Mobility Overal bed mobility: Needs Assistance Bed Mobility: Rolling;Supine to Sit;Sit to Supine Rolling: Mod assist   Supine to sit: Mod assist Sit to supine: Mod assist   General bed mobility comments: Pt able to roll to L with increased verbal and tactile cues for hand placement on bedrail and mod-max A to complete task. Pt noted to spontaneously  elevate trunk into partial long sitting however when asked to sit to the edge of the bed, required mod A to rotate hips to edge of bed. Mod A for BLE elevation onto bed during sit to supine.  Transfers Overall transfer level: Needs assistance Equipment used: Rolling walker (2 wheeled) Transfers: Sit to/from Stand Sit to Stand: Max assist         General transfer comment: Max A for anterior lean and bringing trunk into upright position. Increased verbal cues for sequencing and body position.  Ambulation/Gait Ambulation/Gait assistance: Max assist Gait Distance (Feet): 1 Feet Assistive device: Rolling walker (2 wheeled) Gait Pattern/deviations: Decreased step length - left;Decreased step length - right;Narrow base of support Gait velocity: decreased   General Gait Details: Pt able to take 2 small steps to the L at bedside using RW and mod-max A for balance. Total A for walker management. Pt with forward shoulders and flexed trunk and neck while upright.  Stairs            Wheelchair Mobility    Modified Rankin (Stroke Patients Only)       Balance Overall balance assessment: Needs assistance Sitting-balance support: Feet supported;Bilateral upper extremity supported Sitting balance-Leahy Scale: Good Sitting balance - Comments: Pt able to maintain upright sitting with unsupported back with BUE on bed or lap.   Standing balance support: Bilateral upper extremity supported;During functional activity Standing balance-Leahy Scale: Poor Standing balance comment: Pt required max A for static standing balance with RW.  Pertinent Vitals/Pain Pain Assessment: Faces Faces Pain Scale: Hurts even more Pain Location: L knee and L upper arm Pain Descriptors / Indicators: Discomfort Pain Intervention(s): Monitored during session;Limited activity within patient's tolerance;Repositioned    Home Living Family/patient expects to be discharged  to:: Other (Comment) (pt unable to understand question to state) Living Arrangements: Other (Comment) (pt reports living with her sister however unclear of accurac)               Additional Comments: no family present to verify validity of pt's answers or home environment questions    Prior Function           Comments: Unclear of PLOF. Per previous PT evaluation, it appears that the patient was ambulatory      Hand Dominance        Extremity/Trunk Assessment   Upper Extremity Assessment Upper Extremity Assessment: Difficult to assess due to impaired cognition;Generalized weakness    Lower Extremity Assessment Lower Extremity Assessment: Difficult to assess due to impaired cognition;Generalized weakness       Communication   Communication: HOH  Cognition Arousal/Alertness: Awake/alert Behavior During Therapy: WFL for tasks assessed/performed;Flat affect Overall Cognitive Status: History of cognitive impairments - at baseline                                 General Comments: Pt alert to self and birthday only, believed herself to be at home, unaware or year or situation of admission.      General Comments      Exercises Other Exercises Other Exercises: pt able to perform SLR x 3 bilaterally and LAQ x 10 bilat while seated edge of bed; pt required max verbal and tactile cues   Assessment/Plan    PT Assessment Patient needs continued PT services  PT Problem List Decreased strength;Decreased activity tolerance;Decreased balance;Decreased mobility;Decreased cognition;Decreased safety awareness;Pain       PT Treatment Interventions DME instruction;Gait training;Functional mobility training;Therapeutic activities;Therapeutic exercise;Balance training;Patient/family education    PT Goals (Current goals can be found in the Care Plan section)  Acute Rehab PT Goals Patient Stated Goal: unable to state due to cognition PT Goal Formulation: Patient  unable to participate in goal setting Time For Goal Achievement: 08/11/19 Potential to Achieve Goals: Fair    Frequency Min 2X/week   Barriers to discharge        Co-evaluation               AM-PAC PT "6 Clicks" Mobility  Outcome Measure Help needed turning from your back to your side while in a flat bed without using bedrails?: A Little Help needed moving from lying on your back to sitting on the side of a flat bed without using bedrails?: A Lot Help needed moving to and from a bed to a chair (including a wheelchair)?: A Lot Help needed standing up from a chair using your arms (e.g., wheelchair or bedside chair)?: A Lot Help needed to walk in hospital room?: A Lot Help needed climbing 3-5 steps with a railing? : Total 6 Click Score: 12    End of Session Equipment Utilized During Treatment: Gait belt Activity Tolerance: Patient limited by fatigue;Patient limited by pain Patient left: in bed;with nursing/sitter in room Nurse Communication: Mobility status PT Visit Diagnosis: Unsteadiness on feet (R26.81);Muscle weakness (generalized) (M62.81);History of falling (Z91.81);Pain Pain - Right/Left: Left Pain - part of body: Arm;Knee    Time: 7035-0093 PT Time Calculation (  min) (ACUTE ONLY): 24 min   Charges:              Frederich Chick, SPT  Frederich Chick 07/28/2019, 12:42 PM

## 2019-07-29 LAB — BASIC METABOLIC PANEL
Anion gap: 10 (ref 5–15)
BUN: 22 mg/dL (ref 8–23)
CO2: 27 mmol/L (ref 22–32)
Calcium: 9.3 mg/dL (ref 8.9–10.3)
Chloride: 105 mmol/L (ref 98–111)
Creatinine, Ser: 0.9 mg/dL (ref 0.44–1.00)
GFR calc Af Amer: >60 mL/min (ref >60)
GFR calc non Af Amer: 54 mL/min — ABNORMAL LOW (ref >60)
Glucose, Bld: 120 mg/dL — ABNORMAL HIGH (ref 70–99)
Potassium: 3.6 mmol/L (ref 3.5–5.1)
Sodium: 142 mmol/L (ref 135–145)

## 2019-07-29 LAB — CBC
HCT: 36.8 % (ref 36.0–46.0)
Hemoglobin: 11.9 g/dL — ABNORMAL LOW (ref 12.0–15.0)
MCH: 30.6 pg (ref 26.0–34.0)
MCHC: 32.3 g/dL (ref 30.0–36.0)
MCV: 94.6 fL (ref 80.0–100.0)
Platelets: 171 K/uL (ref 150–400)
RBC: 3.89 MIL/uL (ref 3.87–5.11)
RDW: 14.9 % (ref 11.5–15.5)
WBC: 4.5 K/uL (ref 4.0–10.5)
nRBC: 0 % (ref 0.0–0.2)

## 2019-07-29 LAB — MAGNESIUM: Magnesium: 2 mg/dL (ref 1.7–2.4)

## 2019-07-29 LAB — SARS CORONAVIRUS 2 BY RT PCR (HOSPITAL ORDER, PERFORMED IN ~~LOC~~ HOSPITAL LAB): SARS Coronavirus 2: NEGATIVE

## 2019-07-29 MED ORDER — LOSARTAN POTASSIUM 50 MG PO TABS
100.0000 mg | ORAL_TABLET | Freq: Every day | ORAL | Status: DC
  Administered 2019-07-29 – 2019-08-01 (×4): 100 mg via ORAL
  Filled 2019-07-29 (×4): qty 2

## 2019-07-29 MED ORDER — LORAZEPAM 2 MG/ML IJ SOLN
1.0000 mg | Freq: Once | INTRAMUSCULAR | Status: AC
  Administered 2019-07-29: 1 mg via INTRAVENOUS
  Filled 2019-07-29: qty 1

## 2019-07-29 NOTE — TOC Initial Note (Signed)
Transition of Care [facility]) - Initial/Assessment Note    Patient Details  Name: Stephanie Graham MRN: [id number] Date of Birth: April 24, 1921  [facility] CM/SW Contact:    Elease Hashimoto, LCSW Phone Number: 07/29/2019, 10:43 AM  Clinical Narrative:   Met with daughter-jeanette here at bedside and they do want her to go to rehab for a one or two and then will go home with her. She currently lives with granddaughter but is getting to be too much care for her. Both daughter and granddaugther are pt's CAP worker and provide 8 hr per day of care. Daughter only request is not to go to [facility] due to was there in 02/2018 and caught COVID and her decline began. Will do bed search and work on SNF               Expected Discharge Plan: [place] Barriers to Discharge: Continued Medical Work up, Ship broker   Patient Goals and CMS Choice Patient states their goals for this hospitalization and ongoing recovery are:: Daughter wants her to have rehab if doesn;t go well will take home CMS Medicare.gov Compare Post Acute Care list provided to:: Patient Represenative (must comment) (Jeanette-daughter) Choice offered to / list presented to : [place] / Guardian  Expected Discharge Plan and Services Expected Discharge Plan: [place] In-house Referral: Clinical Social Work   Post Acute Care Choice: [place] Living arrangements for the past 2 months: [place]                                      Prior Living Arrangements/Services Living arrangements for the past 2 months: Single Family Home Lives with:: Relatives (granddaughter) Patient language and need for interpreter reviewed:: No Do you feel safe going back to the place where you live?: Yes      Need for Family Participation in Patient Care: Yes (Comment) Care giver support system in place?: Yes (comment) Current home services: DME (has hospital bed, wheelchair,  bedside commode) Criminal Activity/Legal Involvement Pertinent to Current Situation/Hospitalization: No - Comment as needed  Activities of Daily Living Home Assistive Devices/Equipment: Gilford Rile (specify type) ADL Screening (condition at time of admission) Patient's cognitive ability adequate to safely complete daily activities?: Yes Is the patient deaf or have difficulty hearing?: No Does the patient have difficulty seeing, even when wearing glasses/contacts?: No Does the patient have difficulty concentrating, remembering, or making decisions?: Yes Patient able to express need for assistance with ADLs?: No Does the patient have difficulty dressing or bathing?: Yes Independently performs ADLs?: No Communication: Independent Dressing (OT): Needs assistance Is this a change from baseline?: Pre-admission baseline Grooming: Needs assistance Is this a change from baseline?: Pre-admission baseline Feeding: Independent Bathing: Needs assistance Is this a change from baseline?: Pre-admission baseline Toileting: Needs assistance Is this a change from baseline?: Pre-admission baseline In/Out Bed: Needs assistance Is this a change from baseline?: Pre-admission baseline Walks in Home: Needs assistance Is this a change from baseline?: Pre-admission baseline Does the patient have difficulty walking or climbing stairs?: Yes Weakness of Legs: Both Weakness of Arms/Hands: Both  Permission Sought/Granted Permission sought to share information with : Facility Sport and exercise psychologist, Family Supports Permission granted to share information with : Yes, Verbal Permission Granted  Share Information with NAME: jeannette  Permission granted to share info w AGENCY: SNF  Permission granted to share info w Relationship: daughter  Emotional Assessment Appearance:: Appears stated age Attitude/Demeanor/Rapport: Gracious Affect (typically observed): Calm Orientation: : Oriented to Self   Psych  Involvement: No (comment)  Admission diagnosis:  Hallucinations [R44.3] Subdural hematoma ([place]) [S06.5X9A] AKI (acute kidney injury) ([place]) [N17.9] AMS (altered mental status) [R41.82] Patient Active Problem List   Diagnosis Date Noted  . AMS (altered mental status) 07/27/2019  . Subdural fluid collection 07/27/2019  . Systolic CHF ([place]) 93/55/2174  . Acute blood loss anemia 12/19/2018  . Thrombocytopenia ([place]) 12/19/2018  . Protein-calorie malnutrition, severe 12/19/2018  . Left displaced femoral neck fracture ([place]) 12/18/2018  . Fall at home, initial encounter 12/18/2018  . Dementia with behavioral disturbance ([place]) 12/18/2018  . Hypertension   . Diabetes mellitus without complication ([place])   . 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 Malnutrition of moderate degree 02/24/2018  . Hypothermia 02/21/2018  . Stroke (cerebrum) ([place]) 01/19/2017  . Syncope and collapse 01/18/2017  . HTN (hypertension) 01/18/2017  . Diabetes ([place]) 01/18/2017  . Nausea vomiting and diarrhea 01/18/2017   PCP:  Dion Body, MD Pharmacy:   [facility], [place] [address] Phone: [phone] Fax: [phone]     Social Determinants of Health (SDOH) Interventions    Readmission Risk Interventions No flowsheet data found.

## 2019-07-29 NOTE — Progress Notes (Signed)
PROGRESS NOTE    Stephanie Graham  IRS:[id number] DOB: 05-14-1921 DOA: 07/26/2019 PCP: Marisue Ivan, MD    Assessment & Plan:   Principal Problem:   AMS (altered mental status) Active Problems:   HTN (hypertension)   Diabetes (HCC)   Dementia with behavioral disturbance (HCC)   Subdural fluid collection   Systolic CHF [facility])    Stephanie Graham is a 86 y.o. AA female with medical history significant for dementia, CVA, systolic CHF, hypertension and type 2 diabetes who was brought in by family for concerns of hallucination.   Acute metabolic encephalopathy and visual hallucination 2/2 sleep deprivation Baseline dementia Appears that patient had been having some mixup with her medication had not been sleeping well and noted to have some sundowning.   --Pt improved after resuming home meds and having slept PLAN: --continue home donezepil, Seroquel and Zoloft and Remeron  --d/c sitter to prepare for discharge to SNF rehab   Bilateral subdural fluid collection likely from recent fall Also had recent fall 2 weeks ago that could have been the start of her subdural fluid collection. --neurosurg consulted, repeat CT head stable --home ASA 325 d/c'ed  History of CVA Continue statin --home ASA 325 d/c'ed due to bleeding risk from falls  Systolic CHF Patient appears euvolemic  Hypertension BP elevated. Continue home losartan  Continue carvedilol  Type 2 diabetes Controlled  Weakness and deconditioning --PT eval found pt barely able to walk --family decided on short-term SNF rehab   Severe malnutrition in context of chronic illness, Underweight   DVT prophylaxis: SCD/Compression stockings Code Status: Full code  Family Communication:  Status is: inpatient Dispo:   The patient is from: home Anticipated d/c is to: SNF rehab Anticipated d/c date is: 1-2 days Patient currently is medically stable to d/c.  Waiting on insurance auth.   Subjective and  Interval History:  Pt reported doing ok.  Ate breakfast.  Slept.    Sitter d/c'ed in order to prepare for discharge to SNF rehab.   Objective: Vitals:   07/29/19 0758 07/29/19 0853 07/29/19 1353 07/29/19 1403  BP: (!) 167/81 124/78 (!) 125/99 131/81  Pulse: 64 76 68 68  Resp:   16   Temp:   (!) 97.5 F (36.4 C)   TempSrc:   Oral   SpO2:  100% 100%   Weight:      Height:        Intake/Output Summary (Last 24 hours) at 07/29/2019 1508 Last data filed at 07/29/2019 0500 Gross per 24 hour  Intake 240 ml  Output 1300 ml  Net -1060 ml   Filed Weights   07/26/19 2027  Weight: 44.5 kg    Examination:   Constitutional: NAD, alert, oriented to self and knew she was in the hospital HEENT: conjunctivae and lids normal, EOMI CV: RRR no M,R,G. Distal pulses +2.  No cyanosis.   RESP: CTA B/L, normal respiratory effort  GI: +BS, NTND Extremities: No effusions, edema, or tenderness in BLE SKIN: warm, dry and intact Neuro: II - XII grossly intact.  Sensation intact   Data Reviewed: I have personally reviewed following labs and imaging studies  CBC: Recent Labs  Lab 07/26/19 0401 07/26/19 2053 07/28/19 0551 07/29/19 0559  WBC 5.1 5.2 3.9* 4.5  HGB 11.4* 12.0 10.6* 11.9*  HCT 35.4* 37.4 31.8* 36.8  MCV 93.7 93.3 92.2 94.6  PLT 189 212 178 171   Basic Metabolic Panel: Recent Labs  Lab 07/26/19 0401 07/26/19 2053 07/28/19 0551 07/29/19 0559  NA 142 142 141 142  K 4.1 4.5 3.8 3.6  CL 106 101 106 105  CO2 26 32 28 27  GLUCOSE 170* 139* 116* 120*  BUN 21 25* 24* 22  CREATININE 1.03* 1.11* 1.01* 0.90  CALCIUM 8.9 9.9 8.9 9.3  MG  --   --  2.1 2.0   GFR: Estimated Creatinine Clearance: 26.3 mL/min (by C-G formula based on SCr of 0.9 mg/dL). Liver Function Tests: Recent Labs  Lab 07/26/19 2053  AST 28  ALT 20  ALKPHOS 73  BILITOT 0.7  PROT 7.7  ALBUMIN 4.2   No results for input(s): LIPASE, AMYLASE in the last 168 hours. No results for input(s): AMMONIA in  the last 168 hours. Coagulation Profile: No results for input(s): INR, PROTIME in the last 168 hours. Cardiac Enzymes: No results for input(s): CKTOTAL, CKMB, CKMBINDEX, TROPONINI in the last 168 hours. BNP (last 3 results) No results for input(s): PROBNP in the last 8760 hours. HbA1C: No results for input(s): HGBA1C in the last 72 hours. CBG: No results for input(s): GLUCAP in the last 168 hours. Lipid Profile: No results for input(s): CHOL, HDL, LDLCALC, TRIG, CHOLHDL, LDLDIRECT in the last 72 hours. Thyroid Function Tests: No results for input(s): TSH, T4TOTAL, FREET4, T3FREE, THYROIDAB in the last 72 hours. Anemia Panel: No results for input(s): VITAMINB12, FOLATE, FERRITIN, TIBC, IRON, RETICCTPCT in the last 72 hours. Sepsis Labs: Recent Labs  Lab 07/26/19 2354  LATICACIDVEN 1.2    Recent Results (from the past 240 hour(s))  Culture, blood (routine x 2)     Status: None (Preliminary result)   Collection Time: 07/26/19 11:54 PM   Specimen: BLOOD RIGHT FOREARM  Result Value Ref Range Status   Specimen Description BLOOD RIGHT FOREARM  Final   Special Requests   Final    BOTTLES DRAWN AEROBIC AND ANAEROBIC Blood Culture adequate volume   Culture   Final    NO GROWTH 2 DAYS Performed at [facility], [address], [place], [place] [address]    Report Status PENDING  Incomplete  Culture, blood (routine x 2)     Status: None (Preliminary result)   Collection Time: 07/26/19 11:54 PM   Specimen: Right Antecubital; Blood  Result Value Ref Range Status   Specimen Description RIGHT ANTECUBITAL  Final   Special Requests   Final    BOTTLES DRAWN AEROBIC AND ANAEROBIC Blood Culture adequate volume   Culture   Final    NO GROWTH 2 DAYS Performed at [facility], [address], [place], [place] [address]    Report Status PENDING  Incomplete  SARS Coronavirus 2 by RT PCR (hospital order, performed in [facility] hospital lab) Nasopharyngeal Nasopharyngeal  Swab     Status: None   Collection Time: 07/26/19 11:54 PM   Specimen: Nasopharyngeal Swab  Result Value Ref Range Status   SARS Coronavirus 2 NEGATIVE NEGATIVE Final    Comment: (NOTE) SARS-CoV-2 target nucleic acids are NOT DETECTED.  The SARS-CoV-2 RNA is generally detectable in upper and lower respiratory specimens during the acute phase of infection. The lowest concentration of SARS-CoV-2 viral copies this assay can detect is 250 copies / mL. A negative result does not preclude SARS-CoV-2 infection and should not be used as the sole basis for treatment or other patient management decisions.  A negative result may occur with improper specimen collection / handling, submission of specimen other than nasopharyngeal swab, presence of viral mutation(s) within the areas targeted by this assay,  and inadequate number of viral copies (<250 copies / mL). A negative result must be combined with clinical observations, patient history, and epidemiological information.  Fact Sheet for Patients:   BoilerBrush.com.cy  Fact Sheet for Healthcare Providers: https://pope.com/  This test is not yet approved or  cleared by the [place] FDA and has been authorized for detection and/or diagnosis of SARS-CoV-2 by FDA under an Emergency Use Authorization (EUA).  This EUA will remain in effect (meaning this test can be used) for the duration of the COVID-19 declaration under Section 564(b)(1) of the Act, 21 U.S.C. section 360bbb-3(b)(1), unless the authorization is terminated or revoked sooner.  Performed at [facility], [address], [place], [place] [address]       Radiology Studies: No results found.   Scheduled Meds: . carvedilol  6.25 mg Oral BID  . donepezil  10 mg Oral QHS  . feeding supplement (ENSURE ENLIVE)  237 mL Oral BID BM  . losartan  100 mg Oral Daily  . mirtazapine  15 mg Oral QHS  . pravastatin  80 mg Oral  q1800  . QUEtiapine  50 mg Oral QHS  . sertraline  25 mg Oral BID   Continuous Infusions:   LOS: 2 days     Darlin Priestly, MD [facility] If 7PM-7AM, please contact night-coverage 07/29/2019, 3:08 PM

## 2019-07-29 NOTE — TOC Progression Note (Addendum)
Transition of Care [facility]) - Progression Note    Patient Details  Name: Stephanie Graham MRN: [id number] Date of Birth: 1921-01-30  Transition of Care [facility]) CM/SW Contact  Amarilis Belflower, Lemar Livings, LCSW Phone Number: 07/29/2019, 1:00 PM  Clinical Narrative:  Two bed offers daughter has chosen [facility] . Will need COVID test have had AC to order and will work on Fire[employer]. [facility] will get everything ready for transfer tomorrow.    Ref number [id number]   Expected Discharge Plan: Skilled Nursing Facility Barriers to Discharge: Continued Medical Work up, English as a second language teacher  Expected Discharge Plan and Services Expected Discharge Plan: Skilled Nursing Facility In-house Referral: Clinical Social Work   Post Acute Care Choice: Skilled Nursing Facility Living arrangements for the past 2 months: Single Family Home                                       Social Determinants of Health (SDOH) Interventions    Readmission Risk Interventions No flowsheet data found.

## 2019-07-29 NOTE — Care Management Important Message (Signed)
Important Message  Patient Details  Name: Stephanie Graham MRN: [id number] Date of Birth: February 27, 1921   Medicare Important Message Given:  N/A - LOS <3 / Initial given by admissions     [name] A Kinan Safley 07/29/2019, 9:09 AM

## 2019-07-29 NOTE — NC FL2 (Signed)
MEDICAID FL2 LEVEL OF CARE SCREENING TOOL     IDENTIFICATION  Patient Name: Stephanie Graham Birthdate: 1921-03-04 Sex: female Admission Date (Current Location): 07/26/2019  [place] and [place] Number:  [address] [id number] [facility] Facility and Address:  [facility], [address], [place], [place] [address]      Provider Number: [id number]  Attending Physician Name and Address:  Darlin Priestly, MD  Relative Name and Phone Number:  Josepha Pigg [phone]    Current Level of Care: Hospital Recommended Level of Care: Skilled Nursing Facility Prior Approval Number:    Date Approved/Denied:   PASRR Number: [id number]  Discharge Plan: SNF    Current Diagnoses: Patient Active Problem List   Diagnosis Date Noted  . AMS (altered mental status) 07/27/2019  . Subdural fluid collection 07/27/2019  . Systolic CHF (HCC) 07/27/2019  . Acute blood loss anemia 12/19/2018  . Thrombocytopenia (HCC) 12/19/2018  . Protein-calorie malnutrition, severe 12/19/2018  . Left displaced femoral neck fracture (HCC) 12/18/2018  . Fall at home, initial encounter 12/18/2018  . Dementia with behavioral disturbance (HCC) 12/18/2018  . Hypertension   . Diabetes mellitus without complication (HCC)   . 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 Malnutrition of moderate degree 02/24/2018  . Hypothermia 02/21/2018  . 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 Syncope and collapse 01/18/2017  . HTN (hypertension) 01/18/2017  . Diabetes (HCC) 01/18/2017  . Nausea vomiting and diarrhea 01/18/2017    Orientation RESPIRATION BLADDER Height & Weight     Self  Normal External catheter Weight: 98 lb (44.5 kg) Height:  5\' 3"  (160 cm)  BEHAVIORAL SYMPTOMS/MOOD NEUROLOGICAL BOWEL NUTRITION STATUS      Continent Diet (Dys 2 thin liquids)  AMBULATORY STATUS COMMUNICATION OF NEEDS Skin   Extensive Assist   Normal                        Personal Care Assistance Level of Assistance  Bathing, Dressing Bathing Assistance: Limited assistance   Dressing Assistance: Limited assistance     Functional Limitations Info  Sight (Blind in right eye) Sight Info: Impaired        SPECIAL CARE FACTORS FREQUENCY  PT (By licensed PT), OT (By licensed OT)     PT Frequency: 5x week OT Frequency: 3-5 x week            Contractures      Additional Factors Info  Isolation Precautions (MRSA 2020) Code Status Info: Full Code Allergies Info: Simivastatin, Verapamil, hydrochlorothiazide     Isolation Precautions Info: MRSA history     Current Medications (07/29/2019):  This is the current hospital active medication list Current Facility-Administered Medications  Medication Dose Route Frequency Provider Last Rate Last Admin  . acetaminophen (TYLENOL) tablet 650 mg  650 mg Oral Q8H PRN Tu, Ching T, DO      . carvedilol (COREG) tablet 6.25 mg  6.25 mg Oral BID Tu, Ching T, DO   6.25 mg at 07/28/19 2000  . donepezil (ARICEPT) tablet 10 mg  10 mg Oral QHS Tu, Ching T, DO   10 mg at 07/28/19 2002  . feeding supplement (ENSURE ENLIVE) (ENSURE ENLIVE) liquid 237 mL  237 mL Oral BID BM 2003, MD   237 mL at 07/28/19 1427  . haloperidol lactate (HALDOL) injection 5 mg  5 mg Intravenous Q6H PRN 09/28/19, NP   5 mg at 07/28/19 1456  . losartan (COZAAR) tablet  100 mg  100 mg Oral Daily Darlin Priestly, MD      . mirtazapine (REMERON) tablet 15 mg  15 mg Oral QHS Darlin Priestly, MD   15 mg at 07/28/19 2009  . pravastatin (PRAVACHOL) tablet 80 mg  80 mg Oral q1800 Tu, Ching T, DO   80 mg at 07/28/19 1811  . QUEtiapine (SEROQUEL) tablet 50 mg  50 mg Oral QHS Tu, Ching T, DO   50 mg at 07/28/19 2001  . sertraline (ZOLOFT) tablet 25 mg  25 mg Oral BID Tu, Ching T, DO   25 mg at 07/28/19 2001     Discharge Medications: Please see discharge summary for a list of discharge medications.  Relevant Imaging Results:  Relevant Lab  Results:   Additional Information SSN: [id number]  Bentley Fissel, Lemar Livings, LCSW

## 2019-07-30 LAB — BASIC METABOLIC PANEL
Anion gap: 8 (ref 5–15)
BUN: 24 mg/dL — ABNORMAL HIGH (ref 8–23)
CO2: 28 mmol/L (ref 22–32)
Calcium: 9.1 mg/dL (ref 8.9–10.3)
Chloride: 107 mmol/L (ref 98–111)
Creatinine, Ser: 0.98 mg/dL (ref 0.44–1.00)
GFR calc Af Amer: 57 mL/min — ABNORMAL LOW (ref >60)
GFR calc non Af Amer: 49 mL/min — ABNORMAL LOW (ref >60)
Glucose, Bld: 162 mg/dL — ABNORMAL HIGH (ref 70–99)
Potassium: 3.9 mmol/L (ref 3.5–5.1)
Sodium: 143 mmol/L (ref 135–145)

## 2019-07-30 LAB — MAGNESIUM: Magnesium: 2 mg/dL (ref 1.7–2.4)

## 2019-07-30 LAB — CBC
HCT: 34.5 % — ABNORMAL LOW (ref 36.0–46.0)
Hemoglobin: 11.2 g/dL — ABNORMAL LOW (ref 12.0–15.0)
MCH: 30.4 pg (ref 26.0–34.0)
MCHC: 32.5 g/dL (ref 30.0–36.0)
MCV: 93.8 fL (ref 80.0–100.0)
Platelets: 183 10*3/uL (ref 150–400)
RBC: 3.68 MIL/uL — ABNORMAL LOW (ref 3.87–5.11)
RDW: 14.8 % (ref 11.5–15.5)
WBC: 5 10*3/uL (ref 4.0–10.5)
nRBC: 0 % (ref 0.0–0.2)

## 2019-07-30 NOTE — Progress Notes (Signed)
PROGRESS NOTE    Stephanie Graham  DXI:[id number] DOB: 1921-05-09 DOA: 07/26/2019 PCP: Marisue Ivan, MD    Assessment & Plan:   Principal Problem:   AMS (altered mental status) Active Problems:   HTN (hypertension)   Diabetes (HCC)   Dementia with behavioral disturbance (HCC)   Subdural fluid collection   Systolic CHF [facility])    Stephanie Graham is a 86 y.o. AA female with medical history significant for dementia, CVA, systolic CHF, hypertension and type 2 diabetes who was brought in by family for concerns of hallucination.   Acute metabolic encephalopathy and visual hallucination 2/2 sleep deprivation Baseline dementia Appears that patient had been having some mixup with her medication had not been sleeping well and noted to have some sundowning.   --Pt improved after resuming home meds and having slept PLAN: --continue home donezepil, Seroquel and Zoloft and Remeron  --Avoid sitter to prepare for discharge to SNF rehab  --IV haldol PRN for agitation  Bilateral subdural fluid collection likely from recent fall Also had recent fall 2 weeks ago that could have been the start of her subdural fluid collection. --neurosurg consulted, repeat CT head stable --home ASA 325 d/c'ed  History of CVA Continue statin --home ASA 325 d/c'ed due to bleeding risk from falls  Systolic CHF Patient appears euvolemic  Hypertension BP elevated. Continue home losartan  Continue carvedilol  Type 2 diabetes Controlled  Weakness and deconditioning --PT eval found pt barely able to walk --family decided on short-term SNF rehab   Severe malnutrition in context of chronic illness, Underweight   DVT prophylaxis: SCD/Compression stockings Code Status: Full code  Family Communication:  Status is: inpatient Dispo:   The patient is from: home Anticipated d/c is to: SNF rehab Anticipated d/c date is: whenever insurance approves.  Patient currently is medically stable to  d/c.  Waiting on insurance auth.   Subjective and Interval History:  Per nursing, pt slept all day.  Pt complained that she couldn't get out of bed.   Objective: Vitals:   07/30/19 0738 07/30/19 1205 07/30/19 1645 07/30/19 1935  BP: 111/62 (!) 111/58 128/80 (!) 159/79  Pulse: 68 64 78 73  Resp: 16 17 20 15   Temp: 98.1 F (36.7 C) 98.1 F (36.7 C) 98.1 F (36.7 C) 98.7 F (37.1 C)  TempSrc:    Oral  SpO2: 96% 99% 100% 98%  Weight:      Height:        Intake/Output Summary (Last 24 hours) at 07/31/2019 0155 Last data filed at 07/30/2019 0500 Gross per 24 hour  Intake --  Output 800 ml  Net -800 ml   Filed Weights   07/26/19 2027  Weight: 44.5 kg    Examination:   Constitutional: NAD, alert, oriented to self and place HEENT: conjunctivae and lids normal, EOMI CV: No cyanosis.   RESP: normal respiratory effort  Extremities: No effusions, edema, in BLE SKIN: warm, dry and intact Neuro: II - XII grossly intact.     Data Reviewed: I have personally reviewed following labs and imaging studies  CBC: Recent Labs  Lab 07/26/19 0401 07/26/19 2053 07/28/19 0551 07/29/19 0559 07/30/19 0613  WBC 5.1 5.2 3.9* 4.5 5.0  HGB 11.4* 12.0 10.6* 11.9* 11.2*  HCT 35.4* 37.4 31.8* 36.8 34.5*  MCV 93.7 93.3 92.2 94.6 93.8  PLT 189 212 178 171 183   Basic Metabolic Panel: Recent Labs  Lab 07/26/19 0401 07/26/19 2053 07/28/19 0551 07/29/19 0559 07/30/19 0613  NA 142 142 141  142 143  K 4.1 4.5 3.8 3.6 3.9  CL 106 101 106 105 107  CO2 26 32 28 27 28   GLUCOSE 170* 139* 116* 120* 162*  BUN 21 25* 24* 22 24*  CREATININE 1.03* 1.11* 1.01* 0.90 0.98  CALCIUM 8.9 9.9 8.9 9.3 9.1  MG  --   --  2.1 2.0 2.0   GFR: Estimated Creatinine Clearance: 24.1 mL/min (by C-G formula based on SCr of 0.98 mg/dL). Liver Function Tests: Recent Labs  Lab 07/26/19 2053  AST 28  ALT 20  ALKPHOS 73  BILITOT 0.7  PROT 7.7  ALBUMIN 4.2   No results for input(s): LIPASE, AMYLASE in  the last 168 hours. No results for input(s): AMMONIA in the last 168 hours. Coagulation Profile: No results for input(s): INR, PROTIME in the last 168 hours. Cardiac Enzymes: No results for input(s): CKTOTAL, CKMB, CKMBINDEX, TROPONINI in the last 168 hours. BNP (last 3 results) No results for input(s): PROBNP in the last 8760 hours. HbA1C: No results for input(s): HGBA1C in the last 72 hours. CBG: No results for input(s): GLUCAP in the last 168 hours. Lipid Profile: No results for input(s): CHOL, HDL, LDLCALC, TRIG, CHOLHDL, LDLDIRECT in the last 72 hours. Thyroid Function Tests: No results for input(s): TSH, T4TOTAL, FREET4, T3FREE, THYROIDAB in the last 72 hours. Anemia Panel: No results for input(s): VITAMINB12, FOLATE, FERRITIN, TIBC, IRON, RETICCTPCT in the last 72 hours. Sepsis Labs: Recent Labs  Lab 07/26/19 2354  LATICACIDVEN 1.2    Recent Results (from the past 240 hour(s))  Culture, blood (routine x 2)     Status: None (Preliminary result)   Collection Time: 07/26/19 11:54 PM   Specimen: BLOOD RIGHT FOREARM  Result Value Ref Range Status   Specimen Description BLOOD RIGHT FOREARM  Final   Special Requests   Final    BOTTLES DRAWN AEROBIC AND ANAEROBIC Blood Culture adequate volume   Culture   Final    NO GROWTH 3 DAYS Performed at [facility], [address], [place], [place]    Report Status PENDING  Incomplete  Culture, blood (routine x 2)     Status: None (Preliminary result)   Collection Time: 07/26/19 11:54 PM   Specimen: Right Antecubital; Blood  Result Value Ref Range Status   Specimen Description RIGHT ANTECUBITAL  Final   Special Requests   Final    BOTTLES DRAWN AEROBIC AND ANAEROBIC Blood Culture adequate volume   Culture   Final    NO GROWTH 3 DAYS Performed at [facility], [address] [place], [place]    Report Status PENDING  Incomplete  SARS Coronavirus 2 by RT PCR (hospital order, performed in  [facility] hospital lab) Nasopharyngeal Nasopharyngeal Swab     Status: None   Collection Time: 07/26/19 11:54 PM   Specimen: Nasopharyngeal Swab  Result Value Ref Range Status   SARS Coronavirus 2 NEGATIVE NEGATIVE Final    Comment: (NOTE) SARS-CoV-2 target nucleic acids are NOT DETECTED.  The SARS-CoV-2 RNA is generally detectable in upper and lower respiratory specimens during the acute phase of infection. The lowest concentration of SARS-CoV-2 viral copies this assay can detect is 250 copies / mL. A negative result does not preclude SARS-CoV-2 infection and should not be used as the sole basis for treatment or other patient management decisions.  A negative result may occur with improper specimen collection / handling, submission of specimen other than nasopharyngeal swab, presence of viral mutation(s) within the  areas targeted by this assay, and inadequate number of viral copies (<250 copies / mL). A negative result must be combined with clinical observations, patient history, and epidemiological information.  Fact Sheet for Patients:   BoilerBrush.com.cy  Fact Sheet for Healthcare Providers: https://pope.com/  This test is not yet approved or  cleared by the [place] FDA and has been authorized for detection and/or diagnosis of SARS-CoV-2 by FDA under an Emergency Use Authorization (EUA).  This EUA will remain in effect (meaning this test can be used) for the duration of the COVID-19 declaration under Section 564(b)(1) of the Act, 21 U.S.C. section 360bbb-3(b)(1), unless the authorization is terminated or revoked sooner.  Performed at [facility], [address] [place], [place] [address]   SARS Coronavirus 2 by RT PCR (hospital order, performed in [facility] hospital lab) Nasopharyngeal Nasopharyngeal Swab     Status: None   Collection Time: 07/29/19  5:30 PM   Specimen: Nasopharyngeal Swab  Result Value  Ref Range Status   SARS Coronavirus 2 NEGATIVE NEGATIVE Final    Comment: (NOTE) SARS-CoV-2 target nucleic acids are NOT DETECTED.  The SARS-CoV-2 RNA is generally detectable in upper and lower respiratory specimens during the acute phase of infection. The lowest concentration of SARS-CoV-2 viral copies this assay can detect is 250 copies / mL. A negative result does not preclude SARS-CoV-2 infection and should not be used as the sole basis for treatment or other patient management decisions.  A negative result may occur with improper specimen collection / handling, submission of specimen other than nasopharyngeal swab, presence of viral mutation(s) within the areas targeted by this assay, and inadequate number of viral copies (<250 copies / mL). A negative result must be combined with clinical observations, patient history, and epidemiological information.  Fact Sheet for Patients:   BoilerBrush.com.cy  Fact Sheet for Healthcare Providers: https://pope.com/  This test is not yet approved or  cleared by the [place] FDA and has been authorized for detection and/or diagnosis of SARS-CoV-2 by FDA under an Emergency Use Authorization (EUA).  This EUA will remain in effect (meaning this test can be used) for the duration of the COVID-19 declaration under Section 564(b)(1) of the Act, 21 U.S.C. section 360bbb-3(b)(1), unless the authorization is terminated or revoked sooner.  Performed at [facility], [address] [place], [place] [address]       Radiology Studies: No results found.   Scheduled Meds: . carvedilol  6.25 mg Oral BID  . donepezil  10 mg Oral QHS  . feeding supplement (ENSURE ENLIVE)  237 mL Oral BID BM  . losartan  100 mg Oral Daily  . mirtazapine  15 mg Oral QHS  . pravastatin  80 mg Oral q1800  . QUEtiapine  50 mg Oral QHS  . sertraline  25 mg Oral BID   Continuous Infusions:   LOS: 4  days     Darlin Priestly, MD [facility] If 7PM-7AM, please contact night-coverage 07/31/2019, 1:55 AM

## 2019-07-31 NOTE — Progress Notes (Signed)
PROGRESS NOTE    Stephanie Graham  EHU:[id number] DOB: 11-26-1921 DOA: 07/26/2019 PCP: Marisue Ivan, MD    Assessment & Plan:   Principal Problem:   AMS (altered mental status) Active Problems:   HTN (hypertension)   Diabetes (HCC)   Dementia with behavioral disturbance (HCC)   Subdural fluid collection   Systolic CHF [facility])   Stephanie Graham is a 86 y.o. AA female with medical history significant for dementia, CVA, systolic CHF, hypertension and type 2 diabetes who was brought in by family for concerns of hallucination.   Acute metabolic encephalopathy and visual hallucination 2/2 sleep deprivation Baseline dementia Appears that patient had been having some mixup with her medication had not been sleeping well and noted to have some sundowning.   --Pt improved after resuming home meds and having slept --continue home donezepil, Seroquel and Zoloft and Remeron  --Avoid sitter to prepare for discharge to SNF rehab  --IV haldol PRN for agitation  Bilateral subdural fluid collection likely from recent fall - recent fall 2 weeks ago that could have been the start of her subdural fluid collection. --neurosurg consulted, repeat CT head stable --home ASA 325 d/c'ed  History of CVA Continue statin --home ASA 325 d/c'ed due to bleeding risk from falls  Chronic systolic CHF Well compensated at this time  Hypertension BP elevated. Continue home losartan  Continue carvedilol  Type 2 diabetes Controlled  Weakness and deconditioning --PT eval found pt barely able to walk --family decided on short-term SNF rehab -waiting on insurance authorization and SNF placement.  TOC team aware and working on it  Severe malnutrition in context of chronic illness, Underweight  Considering her advanced age and multiple medical comorbidities I will request palliative care consultation for tomorrow to discuss goals of care. overall poor prognosis  DVT prophylaxis:  SCD/Compression stockings Code Status: Full code  Family Communication:  Status is: inpatient Dispo:   The patient is from: home Anticipated d/c is to: SNF rehab Anticipated d/c date is: whenever insurance approves.  Patient currently is medically stable to d/c.  Waiting on insurance auth.   Subjective and Interval History:  No new issues, waiting for insurance authorization   Objective: Vitals:   07/31/19 0020 07/31/19 0441 07/31/19 0843 07/31/19 1206  BP: (!) 159/81 130/63 (!) 157/71 (!) 160/80  Pulse: 69 61 60 69  Resp: 18 16 17 16   Temp: 98.1 F (36.7 C) (!) 97.5 F (36.4 C) 98 F (36.7 C) 98.2 F (36.8 C)  TempSrc:  Oral    SpO2:  100% 100% 95%  Weight:      Height:       No intake or output data in the 24 hours ending 07/31/19 1433 Filed Weights   07/26/19 2027  Weight: 44.5 kg    Examination:   Constitutional: NAD, alert,  HEENT: conjunctivae and lids normal, EOMI CV: No cyanosis.   RESP: normal respiratory effort  Extremities: No effusions, edema, in BLE SKIN: warm, dry and intact Neuro: II - XII grossly intact.  Pleasantly confused   Data Reviewed: I have personally reviewed following labs and imaging studies  CBC: Recent Labs  Lab 07/26/19 0401 07/26/19 2053 07/28/19 0551 07/29/19 0559 07/30/19 0613  WBC 5.1 5.2 3.9* 4.5 5.0  HGB 11.4* 12.0 10.6* 11.9* 11.2*  HCT 35.4* 37.4 31.8* 36.8 34.5*  MCV 93.7 93.3 92.2 94.6 93.8  PLT 189 212 178 171 183   Basic Metabolic Panel: Recent Labs  Lab 07/26/19 0401 07/26/19 2053 07/28/19 0551 07/29/19  0559 07/30/19 0613  NA 142 142 141 142 143  K 4.1 4.5 3.8 3.6 3.9  CL 106 101 106 105 107  CO2 26 32 28 27 28   GLUCOSE 170* 139* 116* 120* 162*  BUN 21 25* 24* 22 24*  CREATININE 1.03* 1.11* 1.01* 0.90 0.98  CALCIUM 8.9 9.9 8.9 9.3 9.1  MG  --   --  2.1 2.0 2.0   GFR: Estimated Creatinine Clearance: 24.1 mL/min (by C-G formula based on SCr of 0.98 mg/dL). Liver Function Tests: Recent Labs   Lab 07/26/19 2053  AST 28  ALT 20  ALKPHOS 73  BILITOT 0.7  PROT 7.7  ALBUMIN 4.2   No results for input(s): LIPASE, AMYLASE in the last 168 hours. No results for input(s): AMMONIA in the last 168 hours. Coagulation Profile: No results for input(s): INR, PROTIME in the last 168 hours. Cardiac Enzymes: No results for input(s): CKTOTAL, CKMB, CKMBINDEX, TROPONINI in the last 168 hours. BNP (last 3 results) No results for input(s): PROBNP in the last 8760 hours. HbA1C: No results for input(s): HGBA1C in the last 72 hours. CBG: No results for input(s): GLUCAP in the last 168 hours. Lipid Profile: No results for input(s): CHOL, HDL, LDLCALC, TRIG, CHOLHDL, LDLDIRECT in the last 72 hours. Thyroid Function Tests: No results for input(s): TSH, T4TOTAL, FREET4, T3FREE, THYROIDAB in the last 72 hours. Anemia Panel: No results for input(s): VITAMINB12, FOLATE, FERRITIN, TIBC, IRON, RETICCTPCT in the last 72 hours. Sepsis Labs: Recent Labs  Lab 07/26/19 2354  LATICACIDVEN 1.2    Recent Results (from the past 240 hour(s))  Culture, blood (routine x 2)     Status: None (Preliminary result)   Collection Time: 07/26/19 11:54 PM   Specimen: BLOOD RIGHT FOREARM  Result Value Ref Range Status   Specimen Description BLOOD RIGHT FOREARM  Final   Special Requests   Final    BOTTLES DRAWN AEROBIC AND ANAEROBIC Blood Culture adequate volume   Culture   Final    NO GROWTH 4 DAYS Performed at [facility], [address], [place], [place]    Report Status PENDING  Incomplete  Culture, blood (routine x 2)     Status: None (Preliminary result)   Collection Time: 07/26/19 11:54 PM   Specimen: Right Antecubital; Blood  Result Value Ref Range Status   Specimen Description RIGHT ANTECUBITAL  Final   Special Requests   Final    BOTTLES DRAWN AEROBIC AND ANAEROBIC Blood Culture adequate volume   Culture   Final    NO GROWTH 4 DAYS Performed at [facility], [address], [place], [place]    Report Status PENDING  Incomplete  SARS Coronavirus 2 by RT PCR (hospital order, performed in [facility] hospital lab) Nasopharyngeal Nasopharyngeal Swab     Status: None   Collection Time: 07/26/19 11:54 PM   Specimen: Nasopharyngeal Swab  Result Value Ref Range Status   SARS Coronavirus 2 NEGATIVE NEGATIVE Final    Comment: (NOTE) SARS-CoV-2 target nucleic acids are NOT DETECTED.  The SARS-CoV-2 RNA is generally detectable in upper and lower respiratory specimens during the acute phase of infection. The lowest concentration of SARS-CoV-2 viral copies this assay can detect is 250 copies / mL. A negative result does not preclude SARS-CoV-2 infection and should not be used as the sole basis for treatment or other patient management decisions.  A negative result may occur with improper specimen collection / handling, submission of specimen other than  nasopharyngeal swab, presence of viral mutation(s) within the areas targeted by this assay, and inadequate number of viral copies (<250 copies / mL). A negative result must be combined with clinical observations, patient history, and epidemiological information.  Fact Sheet for Patients:   BoilerBrush.com.cy  Fact Sheet for Healthcare Providers: https://pope.com/  This test is not yet approved or  cleared by the [place] FDA and has been authorized for detection and/or diagnosis of SARS-CoV-2 by FDA under an Emergency Use Authorization (EUA).  This EUA will remain in effect (meaning this test can be used) for the duration of the COVID-19 declaration under Section 564(b)(1) of the Act, 21 U.S.C. section 360bbb-3(b)(1), unless the authorization is terminated or revoked sooner.  Performed at [facility], [address] [place], [place] [address]   SARS Coronavirus 2 by RT PCR (hospital order, performed in [facility] hospital lab)  Nasopharyngeal Nasopharyngeal Swab     Status: None   Collection Time: 07/29/19  5:30 PM   Specimen: Nasopharyngeal Swab  Result Value Ref Range Status   SARS Coronavirus 2 NEGATIVE NEGATIVE Final    Comment: (NOTE) SARS-CoV-2 target nucleic acids are NOT DETECTED.  The SARS-CoV-2 RNA is generally detectable in upper and lower respiratory specimens during the acute phase of infection. The lowest concentration of SARS-CoV-2 viral copies this assay can detect is 250 copies / mL. A negative result does not preclude SARS-CoV-2 infection and should not be used as the sole basis for treatment or other patient management decisions.  A negative result may occur with improper specimen collection / handling, submission of specimen other than nasopharyngeal swab, presence of viral mutation(s) within the areas targeted by this assay, and inadequate number of viral copies (<250 copies / mL). A negative result must be combined with clinical observations, patient history, and epidemiological information.  Fact Sheet for Patients:   BoilerBrush.com.cy  Fact Sheet for Healthcare Providers: https://pope.com/  This test is not yet approved or  cleared by the [place] FDA and has been authorized for detection and/or diagnosis of SARS-CoV-2 by FDA under an Emergency Use Authorization (EUA).  This EUA will remain in effect (meaning this test can be used) for the duration of the COVID-19 declaration under Section 564(b)(1) of the Act, 21 U.S.C. section 360bbb-3(b)(1), unless the authorization is terminated or revoked sooner.  Performed at [facility], [address], [place], [place] [address]       Radiology Studies: No results found.   Scheduled Meds:  carvedilol  6.25 mg Oral BID   donepezil  10 mg Oral QHS   feeding supplement (ENSURE ENLIVE)  237 mL Oral BID BM   losartan  100 mg Oral Daily   mirtazapine  15 mg Oral  QHS   pravastatin  80 mg Oral q1800   QUEtiapine  50 mg Oral QHS   sertraline  25 mg Oral BID   Continuous Infusions:   LOS: 4 days     Delfino Lovett, MD [facility] If 7PM-7AM, please contact night-coverage 07/31/2019, 2:33 PM

## 2019-08-01 DIAGNOSIS — F0391 Unspecified dementia with behavioral disturbance: Secondary | ICD-10-CM

## 2019-08-01 DIAGNOSIS — R410 Disorientation, unspecified: Secondary | ICD-10-CM

## 2019-08-01 DIAGNOSIS — R443 Hallucinations, unspecified: Secondary | ICD-10-CM

## 2019-08-01 DIAGNOSIS — I10 Essential (primary) hypertension: Secondary | ICD-10-CM

## 2019-08-01 DIAGNOSIS — I5022 Chronic systolic (congestive) heart failure: Secondary | ICD-10-CM

## 2019-08-01 DIAGNOSIS — N179 Acute kidney failure, unspecified: Secondary | ICD-10-CM

## 2019-08-01 DIAGNOSIS — G9389 Other specified disorders of brain: Secondary | ICD-10-CM

## 2019-08-01 LAB — CBC
HCT: 36 % (ref 36.0–46.0)
Hemoglobin: 11.5 g/dL — ABNORMAL LOW (ref 12.0–15.0)
MCH: 30.3 pg (ref 26.0–34.0)
MCHC: 31.9 g/dL (ref 30.0–36.0)
MCV: 94.7 fL (ref 80.0–100.0)
Platelets: 178 10*3/uL (ref 150–400)
RBC: 3.8 MIL/uL — ABNORMAL LOW (ref 3.87–5.11)
RDW: 14.6 % (ref 11.5–15.5)
WBC: 5.9 10*3/uL (ref 4.0–10.5)
nRBC: 0 % (ref 0.0–0.2)

## 2019-08-01 LAB — BASIC METABOLIC PANEL
Anion gap: 6 (ref 5–15)
BUN: 27 mg/dL — ABNORMAL HIGH (ref 8–23)
CO2: 29 mmol/L (ref 22–32)
Calcium: 9.4 mg/dL (ref 8.9–10.3)
Chloride: 107 mmol/L (ref 98–111)
Creatinine, Ser: 1.01 mg/dL — ABNORMAL HIGH (ref 0.44–1.00)
GFR calc Af Amer: 55 mL/min — ABNORMAL LOW (ref >60)
GFR calc non Af Amer: 47 mL/min — ABNORMAL LOW (ref >60)
Glucose, Bld: 145 mg/dL — ABNORMAL HIGH (ref 70–99)
Potassium: 3.6 mmol/L (ref 3.5–5.1)
Sodium: 142 mmol/L (ref 135–145)

## 2019-08-01 LAB — CULTURE, BLOOD (ROUTINE X 2)
Culture: NO GROWTH
Culture: NO GROWTH
Special Requests: ADEQUATE
Special Requests: ADEQUATE

## 2019-08-01 MED ORDER — LOSARTAN POTASSIUM 100 MG PO TABS: 100.0000 mg | ORAL_TABLET | Freq: Every day | ORAL | Status: AC

## 2019-08-01 MED ORDER — CARVEDILOL 6.25 MG PO TABS: 6.2500 mg | ORAL_TABLET | Freq: Two times a day (BID) | ORAL | Status: AC

## 2019-08-01 NOTE — Care Management Important Message (Signed)
Important Message  Patient Details  Name: Stephanie Graham MRN: [id number] Date of Birth: Jul 24, 1921   Medicare Important Message Given:  Yes     [name] [name] 08/01/2019, 11:42 AM

## 2019-08-01 NOTE — Discharge Summary (Signed)
Physician Discharge Summary  Stephanie RotundaCatherine Graham XLK:[id number] DOB: 1921/08/22 DOA: 07/26/2019  PCP: Stephanie Graham  Admit date: 07/26/2019 Discharge date: 08/01/2019  Admitted From: Home Disposition:  SNF  Recommendations for Outpatient Follow-up:  1. Follow up with PCP in 1-2 weeks 2. Please obtain BMP/CBC in one week 3. Please follow up on the following pending results:None  Home Health:No Equipment/Devices:None Discharge Condition: Fair CODE STATUS: DNR Diet recommendation: Heart Healthy / Carb Modified   Brief/Interim Summary: Stephanie Graham a 86 y.o.AA femalewith medical history significant fordementia, CVA, systolic CHF, hypertension and type 2 diabetes who was brought in by family for concerns of hallucination.  She was also having some mixups with her medications and had some sundowning effects.  It was thought to be due to sleep deprivation with baseline dementia.  We continued home dose of donepezil, Seroquel and Zoloft.  Patient appears to be back to her baseline.  PT evaluated her and they were recommending SNF placement for rehab.  Patient has an history of recent falls 2 weeks ago.  Found to have some subdural fluid collection which was stable on repeat CT head.  Neurosurgery was consulted and they do not recommend any procedure.  Home dose of aspirin was discontinued due to her risk of fall.  Patient also has an history of CVA and will continue with her statin.  Patient has well compensated systolic heart failure.  Her blood pressure was elevated and we increased the dose of carvedilol and losartan.  She will continue rest of her home meds.   She has advanced dementia, advanced age and multiple medical comorbidities which makes her very high risk for sudden deterioration and death.  Discussed with her daughter as she was full code.  Daughter was very understandable and wants her to be DNR with full scope of medical care. I changed her status to DNR before  discharge.  Discharge Diagnoses:  Principal Problem:   AMS (altered mental status) Active Problems:   HTN (hypertension)   Diabetes (HCC)   Dementia with behavioral disturbance (HCC)   Subdural fluid collection   Systolic CHF [facility](HCC)   Discharge Instructions  Discharge Instructions    Diet - low sodium heart healthy   Complete by: As directed    Increase activity slowly   Complete by: As directed      Allergies as of 08/01/2019      Reactions   Hydrochlorothiazide W-triamterene    Other reaction(s): Unknown   Simvastatin    Other reaction(s): Unknown   Verapamil    Other reaction(s): Unknown      Medication List    STOP taking these medications   aspirin 325 MG tablet     TAKE these medications   acetaminophen 650 MG CR tablet Commonly known as: TYLENOL Take 1 tablet (650 mg total) by mouth every 8 (eight) hours as needed for pain.   Calcium 600-400 MG-UNIT Chew Chew 2 tablets by mouth in the morning and at bedtime.   carvedilol 6.25 MG tablet Commonly known as: COREG Take 1 tablet (6.25 mg total) by mouth 2 (two) times daily. What changed: how much to take   donepezil 10 MG tablet Commonly known as: ARICEPT Take 10 mg by mouth at bedtime.   feeding supplement (GLUCERNA SHAKE) Liqd Take 237 mLs by mouth 3 (three) times daily between meals.   loratadine 10 MG tablet Commonly known as: CLARITIN Take 10 mg by mouth daily as needed for allergies or rhinitis.   losartan 100 MG  tablet Commonly known as: COZAAR Take 1 tablet (100 mg total) by mouth daily. Start taking on: August 02, 2019 What changed:   medication strength  how much to take   metFORMIN 500 MG tablet Commonly known as: GLUCOPHAGE Take 1 tablet (500 mg total) by mouth 2 (two) times daily.   mirtazapine 15 MG tablet Commonly known as: REMERON Take 15 mg by mouth at bedtime.   multivitamin with minerals Tabs tablet Take 1 tablet by mouth daily.   polyethylene glycol 17 g  packet Commonly known as: MIRALAX / GLYCOLAX Take 34 g by mouth daily.   pravastatin 80 MG tablet Commonly known as: PRAVACHOL Take 80 mg by mouth at bedtime.   QUEtiapine 50 MG tablet Commonly known as: SEROquel Take 1 tablet (50 mg total) by mouth at bedtime.   sertraline 50 MG tablet Commonly known as: ZOLOFT Take 75 mg by mouth at bedtime.       Contact information for after-discharge care    Destination    [facility] C[facility] Preferred SNF .   Service: Skilled Nursing Contact information: [address] [place] [address] [phone]                 Allergies  Allergen Reactions  . Hydrochlorothiazide W-Triamterene     Other reaction(s): Unknown  . Simvastatin     Other reaction(s): Unknown  . Verapamil     Other reaction(s): Unknown    Consultations:  None  Procedures/Studies: DG Chest 2 View  Result Date: 07/26/2019 CLINICAL DATA:  Altered mental status EXAM: CHEST - 2 VIEW COMPARISON:  01/02/2019, thoracic radiographs 03/16/2016 FINDINGS: No pleural effusion. Enlarged cardiomediastinal silhouette. Aortic atherosclerosis. Minimal patchy peripheral opacity. No pneumothorax. Age indeterminate moderate compression deformity of upper thoracic vertebra. IMPRESSION: Cardiomegaly. Minimal patchy peripheral opacity, possible atypical pneumonia. Electronically Signed   By: Jasmine Pang M.D.   On: 07/26/2019 21:21   CT HEAD WO CONTRAST  Result Date: 07/27/2019 CLINICAL DATA:  Encephalopathy, subdural collections, follow-up EXAM: CT HEAD WITHOUT CONTRAST TECHNIQUE: Contiguous axial images were obtained from the base of the skull through the vertex without intravenous contrast. Sagittal and coronal MPR images reconstructed from axial data set. COMPARISON:  07/26/2019 FINDINGS: Brain: Generalized atrophy. Normal ventricular morphology. No midline shift or mass effect. Small vessel chronic ischemic changes of deep cerebral white matter. Small  BILATERAL intermediate to low-attenuation subdural collections again identified unchanged. No new intracranial hemorrhage, mass lesion or evidence of acute infarction. Vascular: Atherosclerotic calcification of internal carotid arteries bilaterally at skull base. No hyperdense vessels Skull: Demineralized but intact Sinuses/Orbits: Clear Other: N/A IMPRESSION: Atrophy with small vessel chronic ischemic changes of deep cerebral white matter. Stable small BILATERAL intermediate to low attenuation subdural collections, likely subacute to old in age. No acute intracranial abnormalities. Electronically Signed   By: Ulyses Southward M.D.   On: 07/27/2019 08:33   CT Head Wo Contrast  Result Date: 07/26/2019 CLINICAL DATA:  Psychosis EXAM: CT HEAD WITHOUT CONTRAST TECHNIQUE: Contiguous axial images were obtained from the base of the skull through the vertex without intravenous contrast. COMPARISON:  CT brain 03/07/2019 FINDINGS: Brain: No acute territorial infarction or intracranial mass is visualized. Interval development of subacute to chronic appearing bilateral subdural collections, measuring up to 3 mm bilaterally at the convexities. No midline shift. No significant mass effect. Moderate atrophy. Stable ventricle size. Advanced hypodensity in the white matter consistent with chronic small vessel ischemic change. Vascular: No hyperdense blood vessels. Carotid vascular calcification Skull: Normal.  Negative for fracture or focal lesion. Sinuses/Orbits: Mucosal thickening and calcification in the right maxillary sinus. Bony remodeling of the sinus walls consistent with chronic sinus disease. Other: None IMPRESSION: 1. Interval development of subacute to chronic appearing bilateral subdural fluid collections measuring 3 mm maximum thickness at the bilateral convexities without evidence for midline shift or significant mass effect. No definite acute hemorrhage is visualized at this time. 2. Atrophy with extensive chronic small  vessel ischemic change of the white matter Electronically Signed   By: Jasmine Pang M.D.   On: 07/26/2019 21:46     Subjective: Patient has no new complaints when seen today.  She is completely dependent for her ADLs including feeding.  Denies any pain.  Was unable to answer any orientation questions, just stating yes, okay.  Per nursing staff she does become little agitated in the evening time.  Discharge Exam: Vitals:   08/01/19 0420 08/01/19 0731  BP: (!) 141/63 (!) 143/65  Pulse: (!) 52 (!) 47  Resp: 17 15  Temp: 98.1 F (36.7 C) 97.7 F (36.5 C)  SpO2: (!) 81% 98%   Vitals:   07/31/19 1951 07/31/19 2356 08/01/19 0420 08/01/19 0731  BP: (!) 155/98 (!) 150/65 (!) 141/63 (!) 143/65  Pulse: 72 67 (!) 52 (!) 47  Resp: Temp: 97.6 F (36.4 C) 98.3 F (36.8 C) 98.1 F (36.7 C) 97.7 F (36.5 C)  TempSrc: Oral     SpO2: 100% 93% (!) 81% 98%  Weight:      Height:        General: Pt is alert, awake, frail elderly, not in acute distress Cardiovascular: RRR, S1/S2 +, no rubs, no gallops Respiratory: CTA bilaterally, no wheezing, no rhonchi Abdominal: Soft, NT, ND, bowel sounds + Extremities: no edema, no cyanosis   The results of significant diagnostics from this hospitalization (including imaging, microbiology, ancillary and laboratory) are listed below for reference.    Microbiology: Recent Results (from the past 240 hour(s))  Culture, blood (routine x 2)     Status: None   Collection Time: 07/26/19 11:54 PM   Specimen: BLOOD RIGHT FOREARM  Result Value Ref Range Status   Specimen Description BLOOD RIGHT FOREARM  Final   Special Requests   Final    BOTTLES DRAWN AEROBIC AND ANAEROBIC Blood Culture adequate volume   Culture   Final    NO GROWTH 5 DAYS Performed at [facility], [address], [place], [place] [address]    Report Status 08/01/2019 FINAL  Final  Culture, blood (routine x 2)     Status: None   Collection Time: 07/26/19 11:54 PM    Specimen: Right Antecubital; Blood  Result Value Ref Range Status   Specimen Description RIGHT ANTECUBITAL  Final   Special Requests   Final    BOTTLES DRAWN AEROBIC AND ANAEROBIC Blood Culture adequate volume   Culture   Final    NO GROWTH 5 DAYS Performed at [facility], [address] [place], [place] [address]    Report Status 08/01/2019 FINAL  Final  SARS Coronavirus 2 by RT PCR (hospital order, performed in [facility] hospital lab) Nasopharyngeal Nasopharyngeal Swab     Status: None   Collection Time: 07/26/19 11:54 PM   Specimen: Nasopharyngeal Swab  Result Value Ref Range Status   SARS Coronavirus 2 NEGATIVE NEGATIVE Final    Comment: (NOTE) SARS-CoV-2 target nucleic acids are NOT DETECTED.  The SARS-CoV-2 RNA is generally detectable in upper  and lower respiratory specimens during the acute phase of infection. The lowest concentration of SARS-CoV-2 viral copies this assay can detect is 250 copies / mL. A negative result does not preclude SARS-CoV-2 infection and should not be used as the sole basis for treatment or other patient management decisions.  A negative result may occur with improper specimen collection / handling, submission of specimen other than nasopharyngeal swab, presence of viral mutation(s) within the areas targeted by this assay, and inadequate number of viral copies (<250 copies / mL). A negative result must be combined with clinical observations, patient history, and epidemiological information.  Fact Sheet for Patients:   BoilerBrush.com.cy  Fact Sheet for Healthcare Providers: https://pope.com/  This test is not yet approved or  cleared by the [place] FDA and has been authorized for detection and/or diagnosis of SARS-CoV-2 by FDA under an Emergency Use Authorization (EUA).  This EUA will remain in effect (meaning this test can be used) for the duration of the COVID-19 declaration  under Section 564(b)(1) of the Act, 21 U.S.C. section 360bbb-3(b)(1), unless the authorization is terminated or revoked sooner.  Performed at [facility], [address] [place], [place] [address]   SARS Coronavirus 2 by RT PCR (hospital order, performed in [facility] hospital lab) Nasopharyngeal Nasopharyngeal Swab     Status: None   Collection Time: 07/29/19  5:30 PM   Specimen: Nasopharyngeal Swab  Result Value Ref Range Status   SARS Coronavirus 2 NEGATIVE NEGATIVE Final    Comment: (NOTE) SARS-CoV-2 target nucleic acids are NOT DETECTED.  The SARS-CoV-2 RNA is generally detectable in upper and lower respiratory specimens during the acute phase of infection. The lowest concentration of SARS-CoV-2 viral copies this assay can detect is 250 copies / mL. A negative result does not preclude SARS-CoV-2 infection and should not be used as the sole basis for treatment or other patient management decisions.  A negative result may occur with improper specimen collection / handling, submission of specimen other than nasopharyngeal swab, presence of viral mutation(s) within the areas targeted by this assay, and inadequate number of viral copies (<250 copies / mL). A negative result must be combined with clinical observations, patient history, and epidemiological information.  Fact Sheet for Patients:   BoilerBrush.com.cy  Fact Sheet for Healthcare Providers: https://pope.com/  This test is not yet approved or  cleared by the [place] FDA and has been authorized for detection and/or diagnosis of SARS-CoV-2 by FDA under an Emergency Use Authorization (EUA).  This EUA will remain in effect (meaning this test can be used) for the duration of the COVID-19 declaration under Section 564(b)(1) of the Act, 21 U.S.C. section 360bbb-3(b)(1), unless the authorization is terminated or revoked sooner.  Performed at [facility], [address] [place], [place] [address]      Labs: BNP (last 3 results) No results for input(s): BNP in the last 8760 hours. Basic Metabolic Panel: Recent Labs  Lab 07/26/19 2053 07/28/19 0551 07/29/19 0559 07/30/19 0613 08/01/19 0520  NA 142 141 142 143 142  K 4.5 3.8 3.6 3.9 3.6  CL 101 106 105 107 107  CO2 32 28 27 28 29   GLUCOSE 139* 116* 120* 162* 145*  BUN 25* 24* 22 24* 27*  CREATININE 1.11* 1.01* 0.90 0.98 1.01*  CALCIUM 9.9 8.9 9.3 9.1 9.4  MG  --  2.1 2.0 2.0  --    Liver Function Tests: Recent Labs  Lab 07/26/19 2053  AST 28  ALT 20  ALKPHOS 73  BILITOT 0.7  PROT 7.7  ALBUMIN 4.2   No results for input(s): LIPASE, AMYLASE in the last 168 hours. No results for input(s): AMMONIA in the last 168 hours. CBC: Recent Labs  Lab 07/26/19 2053 07/28/19 0551 07/29/19 0559 07/30/19 0613 08/01/19 0520  WBC 5.2 3.9* 4.5 5.0 5.9  HGB 12.0 10.6* 11.9* 11.2* 11.5*  HCT 37.4 31.8* 36.8 34.5* 36.0  MCV 93.3 92.2 94.6 93.8 94.7  PLT 212 178 171 183 178   Cardiac Enzymes: No results for input(s): CKTOTAL, CKMB, CKMBINDEX, TROPONINI in the last 168 hours. BNP: Invalid input(s): POCBNP CBG: No results for input(s): GLUCAP in the last 168 hours. D-Dimer No results for input(s): DDIMER in the last 72 hours. Hgb A1c No results for input(s): HGBA1C in the last 72 hours. Lipid Profile No results for input(s): CHOL, HDL, LDLCALC, TRIG, CHOLHDL, LDLDIRECT in the last 72 hours. Thyroid function studies No results for input(s): TSH, T4TOTAL, T3FREE, THYROIDAB in the last 72 hours.  Invalid input(s): FREET3 Anemia work up No results for input(s): VITAMINB12, FOLATE, FERRITIN, TIBC, IRON, RETICCTPCT in the last 72 hours. Urinalysis    Component Value Date/Time   COLORURINE YELLOW (A) 07/26/2019 2054   APPEARANCEUR HAZY (A) 07/26/2019 2054   APPEARANCEUR Clear 12/28/2011 2247   LABSPEC 1.014 07/26/2019 2054   LABSPEC 1.013 12/28/2011 2247   PHURINE 6.0  07/26/2019 2054   GLUCOSEU NEGATIVE 07/26/2019 2054   GLUCOSEU 50 mg/dL 44/01/270 5366   HGBUR NEGATIVE 07/26/2019 2054   BILIRUBINUR NEGATIVE 07/26/2019 2054   BILIRUBINUR Negative 12/28/2011 2247   KETONESUR NEGATIVE 07/26/2019 2054   PROTEINUR NEGATIVE 07/26/2019 2054   NITRITE NEGATIVE 07/26/2019 2054   LEUKOCYTESUR NEGATIVE 07/26/2019 2054   LEUKOCYTESUR Negative 12/28/2011 2247   Sepsis Labs Invalid input(s): PROCALCITONIN,  WBC,  LACTICIDVEN Microbiology Recent Results (from the past 240 hour(s))  Culture, blood (routine x 2)     Status: None   Collection Time: 07/26/19 11:54 PM   Specimen: BLOOD RIGHT FOREARM  Result Value Ref Range Status   Specimen Description BLOOD RIGHT FOREARM  Final   Special Requests   Final    BOTTLES DRAWN AEROBIC AND ANAEROBIC Blood Culture adequate volume   Culture   Final    NO GROWTH 5 DAYS Performed at [facility], [address] [place], [place] [address]    Report Status 08/01/2019 FINAL  Final  Culture, blood (routine x 2)     Status: None   Collection Time: 07/26/19 11:54 PM   Specimen: Right Antecubital; Blood  Result Value Ref Range Status   Specimen Description RIGHT ANTECUBITAL  Final   Special Requests   Final    BOTTLES DRAWN AEROBIC AND ANAEROBIC Blood Culture adequate volume   Culture   Final    NO GROWTH 5 DAYS Performed at [facility], [address], [place], [place] [address]    Report Status 08/01/2019 FINAL  Final  SARS Coronavirus 2 by RT PCR (hospital order, performed in [facility] hospital lab) Nasopharyngeal Nasopharyngeal Swab     Status: None   Collection Time: 07/26/19 11:54 PM   Specimen: Nasopharyngeal Swab  Result Value Ref Range Status   SARS Coronavirus 2 NEGATIVE NEGATIVE Final    Comment: (NOTE) SARS-CoV-2 target nucleic acids are NOT DETECTED.  The SARS-CoV-2 RNA is generally detectable in upper and lower respiratory specimens during the acute phase of infection. The  lowest concentration of SARS-CoV-2 viral copies this assay can  detect is 250 copies / mL. A negative result does not preclude SARS-CoV-2 infection and should not be used as the sole basis for treatment or other patient management decisions.  A negative result may occur with improper specimen collection / handling, submission of specimen other than nasopharyngeal swab, presence of viral mutation(s) within the areas targeted by this assay, and inadequate number of viral copies (<250 copies / mL). A negative result must be combined with clinical observations, patient history, and epidemiological information.  Fact Sheet for Patients:   BoilerBrush.com.cy  Fact Sheet for Healthcare Providers: https://pope.com/  This test is not yet approved or  cleared by the [place] FDA and has been authorized for detection and/or diagnosis of SARS-CoV-2 by FDA under an Emergency Use Authorization (EUA).  This EUA will remain in effect (meaning this test can be used) for the duration of the COVID-19 declaration under Section 564(b)(1) of the Act, 21 U.S.C. section 360bbb-3(b)(1), unless the authorization is terminated or revoked sooner.  Performed at [facility], [address] [place], [place] [address]   SARS Coronavirus 2 by RT PCR (hospital order, performed in [facility] hospital lab) Nasopharyngeal Nasopharyngeal Swab     Status: None   Collection Time: 07/29/19  5:30 PM   Specimen: Nasopharyngeal Swab  Result Value Ref Range Status   SARS Coronavirus 2 NEGATIVE NEGATIVE Final    Comment: (NOTE) SARS-CoV-2 target nucleic acids are NOT DETECTED.  The SARS-CoV-2 RNA is generally detectable in upper and lower respiratory specimens during the acute phase of infection. The lowest concentration of SARS-CoV-2 viral copies this assay can detect is 250 copies / mL. A negative result does not preclude SARS-CoV-2 infection and should not  be used as the sole basis for treatment or other patient management decisions.  A negative result may occur with improper specimen collection / handling, submission of specimen other than nasopharyngeal swab, presence of viral mutation(s) within the areas targeted by this assay, and inadequate number of viral copies (<250 copies / mL). A negative result must be combined with clinical observations, patient history, and epidemiological information.  Fact Sheet for Patients:   BoilerBrush.com.cy  Fact Sheet for Healthcare Providers: https://pope.com/  This test is not yet approved or  cleared by the [place] FDA and has been authorized for detection and/or diagnosis of SARS-CoV-2 by FDA under an Emergency Use Authorization (EUA).  This EUA will remain in effect (meaning this test can be used) for the duration of the COVID-19 declaration under Section 564(b)(1) of the Act, 21 U.S.C. section 360bbb-3(b)(1), unless the authorization is terminated or revoked sooner.  Performed at [facility], [address] [place], [place] [address]     Time coordinating discharge: Over 30 minutes  SIGNED:  Arnetha Courser, Graham  [facility] 08/01/2019, 11:25 AM  If 7PM-7AM, please contact night-coverage [email or web address]  This record has been created using Conservation officer, historic buildings. Errors have been sought and corrected,but may not always be located. Such creation errors do not reflect on the standard of care.

## 2019-08-01 NOTE — TOC Transition Note (Signed)
Transition of Care [facility]) - CM/SW Discharge Note   Patient Details  Name: Stephanie Graham MRN: [id number] Date of Birth: 1921-01-25  Transition of Care [facility]) CM/SW Contact:  Lucy Chris, LCSW Phone Number: 08/01/2019, 12:10 PM   Clinical Narrative:  insurance auth received via [facility] for pt to transfer to [facility]. Spoke with Jonathan-ADM covering for [employer]. Daughter contacted and is aware along with bedside RN and MD. RN to call report to [phone]. MD to discuss DNR with daughter prior to DC. DC paperwork in chart. Ref [id number] 7-12-26-12 with CM Monique Nicholason. Once RN and DC paperwork complete will call [facility] for trasnport     Final next level of care: Skilled Nursing Facility Barriers to Discharge: Barriers Resolved   Patient Goals and CMS Choice Patient states their goals for this hospitalization and ongoing recovery are:: Daughter wants her to have rehab if doesn;t go well will take home CMS Medicare.gov Compare Post Acute Care list provided to:: Patient Represenative (must comment) (Jeanette-daughter) Choice offered to / list presented to : [facility] POA / Guardian  Discharge Placement PASRR number recieved: 07/28/19            Patient [facility] bed at: [facility] Patient to be transferred to facility by: [facility] Name of family member notified: Jeanette-daughter Patient and family notified of of transfer: 08/01/19  Discharge Plan and Services In-house Referral: Clinical Social Work   Post Acute Care Choice: Skilled Nursing Facility                               Social Determinants of Health (SDOH) Interventions     Readmission Risk Interventions No flowsheet data found.

## 2019-08-01 NOTE — TOC Progression Note (Addendum)
Transition of Care [facility]) - Progression Note    Patient Details  Name: Shametra Cumberland MRN: [id number] Date of Birth: [birth year]  Transition of Care [facility]) CM/SW Contact  Hien Cunliffe, Lemar Livings, LCSW Phone Number: 08/01/2019, 8:54 AM  Clinical Narrative:   Have checked on insurance auth still pending. Will continue to check and see if can get approved and pt transnferred over to [facility]  11:16 AM Finally gotten insurance auth and have spoken with [facility] he is ready for pt to transfer there today. Ref [id number] 7/12-7/14.   Expected Discharge Plan: Skilled Nursing Facility Barriers to Discharge: Continued Medical Work up, English as a second language teacher  Expected Discharge Plan and Services Expected Discharge Plan: Skilled Nursing Facility In-house Referral: Clinical Social Work   Post Acute Care Choice: Skilled Nursing Facility Living arrangements for the past 2 months: Single Family Home                                       Social Determinants of Health (SDOH) Interventions    Readmission Risk Interventions No flowsheet data found.

## 2019-08-01 NOTE — Plan of Care (Signed)

## 2019-08-01 NOTE — Progress Notes (Signed)
Called facility to give report, placed on hold for 20 minutes, will retry. Transported arrived

## 2019-08-12 ENCOUNTER — Encounter: Payer: Self-pay | Admitting: Nurse Practitioner

## 2019-08-12 ENCOUNTER — Non-Acute Institutional Stay: Payer: Medicare Other | Admitting: Nurse Practitioner

## 2019-08-12 NOTE — Progress Notes (Signed)
Therapist, nutritional Palliative Care Consult Note Telephone: [phone]  Fax: [phone]  PATIENT NAME: Stephanie Graham DOB: 1921-08-18 MRN: [id number]  PRIMARY CARE PROVIDER:   Marisue Ivan, MD  REFERRING PROVIDER:  Dr Hodges/[facility]  I was asked by Dr Yetta Flock to see Ms. Janee Morn for Palliative care consult for goals of care  RESPONSIBLE PARTY:Ms. Rober Minion daughter. [phone]  RECOMMENDATIONS and PLAN: 1.GDJ:MEQASTM to DNR, pending further goc discussion with Para March, daughter,   2.Palliative care encounter; Palliative medicine team will continue to support patient, patient's family, and medical team. Visit consisted of counseling and education dealing with the complex and emotionally intense issues of symptom management and palliative care in the setting of serious and potentially life-threatening illness  I spent 60 minutes providing this consultation,  Start at 1:30pm. More than 50% of the time in this consultation was spent coordinating communication.   HISTORY OF PRESENT ILLNESS:  Stephanie Graham is a 86 y.o. year old female with multiple medical problems including CVA, systolic congestive heart failure (ef 40% 3 / 2013). dementia with behavioral disturbances, hypertension, diabetes, thrombocytopenia, protein calorie malnutrition, anemia, blindness right eye. Dementia, congestive heart failure, cerebral stroke, hypertension , malnutrition moderate, anemia, diabetes, blindness right eye, hypertension, left total knee replacement, left hip arthroplasty. Hospitalized 7 / 6 / 2021 to 7 /12 /2021 4 family concerns of hallucinations with underlying dementia. Recurrent Falls the last two weeks and found to have subdural fluid collection which was stable on repeated CT head. Neurosurgery consulted did not recommend any procedures. Home dose aspirin discontinued and discharged to [facility] health care for Short-Term Rehab where Ms  Barnfield currently resides. [name] does require assistance for transferring to the wheelchair, mobility, adl's and episodes of incontinence. [name] does require assistance with tray setup that is able to feed herself requiring encouraging and prompting at times. [name] does have difficulty working with therapy as remembering what she is asked to do or to retain instructions. At present time [name] is sitting in the wheelchair in her room. [name] appears thin, elderly, comfortable. No visitors present. I visited and observe [name]. [name] did make eye contact. Was Matsunaga endorses that she did remember this writer coming to her home and doing a visit. We talked about palliative care. We talked about how she has been feeling which has been better. We talked about symptoms of pain and shortness of breath but she denies. We talked about her appetite. We talked about her work with therapy. Limited verbal discussion with cognitive impairment. [name] did fall asleep during palliative care visit. Emotional support provided. Medical goals with DNR in place. I have attempted to contact her daughter Para March, message left for update on palliative care visit. I updated nursing staff many changes to current goals, plan of care at present time until further discussion with Para March her daughter. Palliative Care was asked to help to continue to address goals of care.   CODE STATUS: DNR  PPS: 30% HOSPICE ELIGIBILITY/DIAGNOSIS: TBD  PAST MEDICAL HISTORY:  Past Medical History:  Diagnosis Date  . 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 Dementia (HCC)   . Diabetes mellitus without complication (HCC)   . Fall at home, initial encounter 12/18/2018  . Hypertension     SOCIAL HX:  Social History   Tobacco Use  . Smoking status: Never Smoker  . Smokeless tobacco: Former Engineer, water Use Topics  .  Alcohol use: No    ALLERGIES:  Allergies    Allergen Reactions  . Hydrochlorothiazide W-Triamterene     Other reaction(s): Unknown  . Simvastatin     Other reaction(s): Unknown  . Verapamil     Other reaction(s): Unknown     PERTINENT MEDICATIONS:  Outpatient Encounter Medications as of 08/12/2019  Medication Sig  . acetaminophen (TYLENOL) 650 MG CR tablet Take 1 tablet (650 mg total) by mouth every 8 (eight) hours as needed for pain.  . Calcium 600-400 MG-UNIT CHEW Chew 2 tablets by mouth in the morning and at bedtime.  . carvedilol (COREG) 6.25 MG tablet Take 1 tablet (6.25 mg total) by mouth 2 (two) times daily.  Marland Kitchen donepezil (ARICEPT) 10 MG tablet Take 10 mg by mouth at bedtime.  . feeding supplement, GLUCERNA SHAKE, (GLUCERNA SHAKE) LIQD Take 237 mLs by mouth 3 (three) times daily between meals.  Marland Kitchen loratadine (CLARITIN) 10 MG tablet Take 10 mg by mouth daily as needed for allergies or rhinitis.   Marland Kitchen losartan (COZAAR) 100 MG tablet Take 1 tablet (100 mg total) by mouth daily.  . metFORMIN (GLUCOPHAGE) 500 MG tablet Take 1 tablet (500 mg total) by mouth 2 (two) times daily.  . mirtazapine (REMERON) 15 MG tablet Take 15 mg by mouth at bedtime.  . Multiple Vitamin (MULTIVITAMIN WITH MINERALS) TABS tablet Take 1 tablet by mouth daily.  . polyethylene glycol (MIRALAX / GLYCOLAX) 17 g packet Take 34 g by mouth daily.  . pravastatin (PRAVACHOL) 80 MG tablet Take 80 mg by mouth at bedtime.   Marland Kitchen QUEtiapine (SEROQUEL) 50 MG tablet Take 1 tablet (50 mg total) by mouth at bedtime.  . sertraline (ZOLOFT) 50 MG tablet Take 75 mg by mouth at bedtime.   . [DISCONTINUED] divalproex (DEPAKOTE SPRINKLE) 125 MG capsule Take 125-250 mg by mouth 2 (two) times daily.    No facility-administered encounter medications on file as of 08/12/2019.    PHYSICAL EXAM:   General: fragile, thin, confused female Cardiovascular: regular rate and rhythm Pulmonary: clear ant fields Neurological: w/c dependent  [name], NP

## 2019-08-16 ENCOUNTER — Other Ambulatory Visit: Payer: Medicare Other | Admitting: Nurse Practitioner

## 2019-09-19 MED FILL — Carvedilol Tab 3.125 MG: ORAL | Qty: 6.25 | Status: AC

## 2019-09-19 MED FILL — Quetiapine Fumarate Tab 25 MG: ORAL | Qty: 50 | Status: AC

## 2019-09-19 MED FILL — Sertraline HCl Tab 50 MG: ORAL | Qty: 25 | Status: AC

## 2019-09-20 ENCOUNTER — Telehealth: Payer: Self-pay | Admitting: Nurse Practitioner

## 2019-09-20 NOTE — Telephone Encounter (Signed)
Spoke with daughter, Para March, to see if we could change the time of the 9/3 Palliative f/u visit from 2 to 1:30 PM and she was okay with this.

## 2019-10-20 ENCOUNTER — Encounter: Payer: Self-pay | Admitting: Nurse Practitioner

## 2019-10-20 ENCOUNTER — Other Ambulatory Visit: Payer: Medicare Other | Admitting: Nurse Practitioner

## 2019-10-20 ENCOUNTER — Other Ambulatory Visit: Payer: Self-pay

## 2019-10-20 DIAGNOSIS — F0390 Unspecified dementia without behavioral disturbance: Secondary | ICD-10-CM

## 2019-10-20 DIAGNOSIS — Z515 Encounter for palliative care: Secondary | ICD-10-CM

## 2019-10-20 NOTE — Progress Notes (Signed)
Therapist, nutritional Palliative Care Consult Note Telephone: [phone]  Fax: [phone]  PATIENT NAME: Stephanie Graham DOB: 07/06/1921 MRN: [id number]  PRIMARY CARE PROVIDER:   Marisue Ivan, MD  REFERRING PROVIDER:  Marisue Ivan, MD [phone] [facility] [address] [facility] [place],  [place] [address]  RESPONSIBLE PARTY:Stephanie. Rober Minion daughter. [phone]  RECOMMENDATIONS and PLAN: 1.DJT:TSVXBLT to DNR, pending further goc discussion with Para March, daughter,   2.Palliative care encounter; Palliative medicine team will continue to support patient, patient's family, and medical team. Visit consisted of counseling and education dealing with the complex and emotionally intense issues of symptom management and palliative care in the setting of serious and potentially life-threatening illness  3. F/u 2 months for ongoing monitoring of decline, disease progression, appetite  I spent 60 minutes providing this consultation, start at 2:30pm. More than 50% of the time in this consultation was spent coordinating communication.   HISTORY OF PRESENT ILLNESS:  Stephanie Graham is a 86 y.o. year old female with multiple medical problems including CVA, systolic congestive heart failure (ef 40% 3 / 2013). dementia with behavioral disturbances, hypertension, diabetes, thrombocytopenia, protein calorie malnutrition, anemia, blindness right eye.Dementia, congestive heart failure, cerebral stroke, hypertension , malnutrition moderate, anemia, diabetes, blindness right eye, hypertension, left total knee replacement, left hip arthroplasty. Stephanie Graham has been discharged home from [facility] health care for follow-up palliative care in-home visit. I called Para March, Stephanie Graham has daughter to confirm appointment, in agreement. We talked about Stephanie Graham's last hospitalization, discharge of Short-term rehab at [facility] health care. We talked about palliative  care visit at [facility] with Stephanie Graham though was not able to get in touch with [facility] for update at that time. We talked about purpose of palliative care visit. Stephanie Graham in agreement. Para March and I talked about how Stephanie Graham has been doing since she has returned home. Physical therapy and occupational therapy has been completed and she is doing well. Stephanie. Graham is able to ambulate short steps with a walker. Para March endorses she does have to give her her bath and dresser though she is starting to help. Stephanie Graham does feed herself an appetite has been improving with less weight 120 .5 lb. Para March endorses Stephanie. Graham has been weighing her daily. Para March endorses her appetite has significantly improved. Para March endorses Stephanie Graham does appear much better than she previously was prior to hospitalization as far as being more alert, interactive. Para March into her since she puts her in a car and takes her for a ride. Para March endorses Stephanie Graham has not been sleeping during the day and sleeps well at night. Para March endorses she has an upcoming hearing for guardianship for Stephanie Graham. Para March endorses they will probably be moving to another home in about four to six weeks. Para March agreeable to notify new address. Medical goals review. We talked about chronic disease progression of dementia. We talked about medications. We talked about allergy symptoms as she currently is taking loratadine. Stephanie Graham has been on Loratadine for many months. Para March asked if it was okay to switch her dessert egg. Discuss with Para March sometimes it is good to alternate in antihistamines as they sometimes lose their effectiveness over time. We talked about if she was to switch to zyrtec which already has about a week ago and seen an improvement it maybe in a month or two she starts to see symptoms not as managed and then would recommend returning to loratadine. We talked about pollen count being  high  today. We talked about Stephanie Graham's daily routine and quality of life. We talked about role of palliative care and plan of care. We talked about scenarios when to call palliative and when she should call primary provider. We talked about follow-up visit in 2 months if needed or soon or should she declined. Stephanie Graham in agreement, appointment scheduled. Therapeutic listening and emotional support provided. Contact information provided. Questions answered to satisfaction.  Palliative Care was asked to help to continue to address goals of care.   CODE STATUS: DNR  PPS: 40% HOSPICE ELIGIBILITY/DIAGNOSIS: TBD  PAST MEDICAL HISTORY:  Past Medical History:  Diagnosis Date  . 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 Dementia (HCC)   . Diabetes mellitus without complication (HCC)   . Fall at home, initial encounter 12/18/2018  . Hypertension     SOCIAL HX:  Social History   Tobacco Use  . Smoking status: Never Smoker  . Smokeless tobacco: Former Engineer, water Use Topics  . Alcohol use: No    ALLERGIES:  Allergies  Allergen Reactions  . Hydrochlorothiazide W-Triamterene     Other reaction(s): Unknown  . Simvastatin     Other reaction(s): Unknown  . Verapamil     Other reaction(s): Unknown     PERTINENT MEDICATIONS:  Outpatient Encounter Medications as of 10/20/2019  Medication Sig  . acetaminophen (TYLENOL) 650 MG CR tablet Take 1 tablet (650 mg total) by mouth every 8 (eight) hours as needed for pain.  . Calcium 600-400 MG-UNIT CHEW Chew 2 tablets by mouth in the morning and at bedtime.  . carvedilol (COREG) 6.25 MG tablet Take 1 tablet (6.25 mg total) by mouth 2 (two) times daily.  Marland Kitchen donepezil (ARICEPT) 10 MG tablet Take 10 mg by mouth at bedtime.  . feeding supplement, GLUCERNA SHAKE, (GLUCERNA SHAKE) LIQD Take 237 mLs by mouth 3 (three) times daily between meals.  Marland Kitchen loratadine (CLARITIN) 10 MG tablet Take 10 mg by mouth daily as needed for allergies  or rhinitis.   Marland Kitchen losartan (COZAAR) 100 MG tablet Take 1 tablet (100 mg total) by mouth daily.  . metFORMIN (GLUCOPHAGE) 500 MG tablet Take 1 tablet (500 mg total) by mouth 2 (two) times daily.  . mirtazapine (REMERON) 15 MG tablet Take 15 mg by mouth at bedtime.  . Multiple Vitamin (MULTIVITAMIN WITH MINERALS) TABS tablet Take 1 tablet by mouth daily.  . polyethylene glycol (MIRALAX / GLYCOLAX) 17 g packet Take 34 g by mouth daily.  . pravastatin (PRAVACHOL) 80 MG tablet Take 80 mg by mouth at bedtime.   Marland Kitchen QUEtiapine (SEROQUEL) 50 MG tablet Take 1 tablet (50 mg total) by mouth at bedtime.  . sertraline (ZOLOFT) 50 MG tablet Take 75 mg by mouth at bedtime.   . [DISCONTINUED] divalproex (DEPAKOTE SPRINKLE) 125 MG capsule Take 125-250 mg by mouth 2 (two) times daily.    No facility-administered encounter medications on file as of 10/20/2019.    PHYSICAL EXAM:   General: NAD, frail appearing, thin, oriented to self, pleasant female Cardiovascular: regular rate and rhythm Pulmonary: clear ant fields Neurological: generalized weakness; walks with walker; w/c  Berish Bohman Prince Rome, NP

## 2019-11-16 ENCOUNTER — Emergency Department: Payer: Medicare Other

## 2019-11-16 ENCOUNTER — Other Ambulatory Visit: Payer: Self-pay

## 2019-11-16 ENCOUNTER — Encounter: Payer: Self-pay | Admitting: Emergency Medicine

## 2019-11-16 ENCOUNTER — Emergency Department
Admission: EM | Admit: 2019-11-16 | Discharge: 2019-11-16 | Disposition: A | Discharge status: Home / Self Care | Payer: Medicare Other | Attending: Emergency Medicine | Admitting: Emergency Medicine

## 2019-11-16 DIAGNOSIS — E119 Type 2 diabetes mellitus without complications: Secondary | ICD-10-CM | POA: Diagnosis not present

## 2019-11-16 DIAGNOSIS — Z79899 Other long term (current) drug therapy: Secondary | ICD-10-CM | POA: Diagnosis not present

## 2019-11-16 DIAGNOSIS — I10 Essential (primary) hypertension: Secondary | ICD-10-CM | POA: Diagnosis not present

## 2019-11-16 DIAGNOSIS — Z7984 Long term (current) use of oral hypoglycemic drugs: Secondary | ICD-10-CM | POA: Diagnosis not present

## 2019-11-16 DIAGNOSIS — R5383 Other fatigue: Secondary | ICD-10-CM | POA: Insufficient documentation

## 2019-11-16 DIAGNOSIS — F0390 Unspecified dementia without behavioral disturbance: Secondary | ICD-10-CM | POA: Insufficient documentation

## 2019-11-16 DIAGNOSIS — Z20822 Contact with and (suspected) exposure to covid-19: Secondary | ICD-10-CM | POA: Diagnosis not present

## 2019-11-16 DIAGNOSIS — Z87891 Personal history of nicotine dependence: Secondary | ICD-10-CM | POA: Insufficient documentation

## 2019-11-16 LAB — BASIC METABOLIC PANEL
Anion gap: 9 (ref 5–15)
BUN: 20 mg/dL (ref 8–23)
CO2: 28 mmol/L (ref 22–32)
Calcium: 9.4 mg/dL (ref 8.9–10.3)
Chloride: 105 mmol/L (ref 98–111)
Creatinine, Ser: 1.17 mg/dL — ABNORMAL HIGH (ref 0.44–1.00)
GFR, Estimated: 43 mL/min — ABNORMAL LOW (ref >60)
Glucose, Bld: 167 mg/dL — ABNORMAL HIGH (ref 70–99)
Potassium: 4 mmol/L (ref 3.5–5.1)
Sodium: 142 mmol/L (ref 135–145)

## 2019-11-16 LAB — URINALYSIS, COMPLETE (UACMP) WITH MICROSCOPIC
Bacteria, UA: NONE SEEN
Bilirubin Urine: NEGATIVE
Glucose, UA: NEGATIVE mg/dL
Hgb urine dipstick: NEGATIVE
Ketones, ur: NEGATIVE mg/dL
Leukocytes,Ua: NEGATIVE
Nitrite: NEGATIVE
Protein, ur: NEGATIVE mg/dL
Specific Gravity, Urine: 1.012 (ref 1.005–1.030)
pH: 6 (ref 5.0–8.0)

## 2019-11-16 LAB — TROPONIN I (HIGH SENSITIVITY)
Troponin I (High Sensitivity): 46 ng/L — ABNORMAL HIGH (ref <18)
Troponin I (High Sensitivity): 49 ng/L — ABNORMAL HIGH (ref <18)

## 2019-11-16 LAB — CBC
HCT: 35.8 % — ABNORMAL LOW (ref 36.0–46.0)
Hemoglobin: 11.3 g/dL — ABNORMAL LOW (ref 12.0–15.0)
MCH: 30.5 pg (ref 26.0–34.0)
MCHC: 31.6 g/dL (ref 30.0–36.0)
MCV: 96.8 fL (ref 80.0–100.0)
Platelets: 194 10*3/uL (ref 150–400)
RBC: 3.7 MIL/uL — ABNORMAL LOW (ref 3.87–5.11)
RDW: 14.6 % (ref 11.5–15.5)
WBC: 3.7 10*3/uL — ABNORMAL LOW (ref 4.0–10.5)
nRBC: 0 % (ref 0.0–0.2)

## 2019-11-16 LAB — RESPIRATORY PANEL BY RT PCR (FLU A&B, COVID)
Influenza A by PCR: NEGATIVE
Influenza B by PCR: NEGATIVE
SARS Coronavirus 2 by RT PCR: NEGATIVE

## 2019-11-16 NOTE — ED Triage Notes (Signed)
Pt comes into the ED via POV c/o SHOB and peripheral edema.  Pt brought in by home CNA.  Pt has some wheezing for the past couple days.  No wheezing heard audibly in triage at this time.  Pt is a CHF patient.  Pt has not been doing daily weights but she has gained some weight over the past couple days according to the CNA.

## 2019-11-16 NOTE — Discharge Instructions (Signed)
Continue following your usual medication regimen and eating plan.

## 2019-11-16 NOTE — ED Provider Notes (Signed)
[facility] Emergency Department Provider Note  ____________________________________________  Time seen: Approximately 12:21 PM  I have reviewed the triage vital signs and the nursing notes.   HISTORY  Chief Complaint Shortness of Breath and Edema    Level 5 Caveat: Portions of the History and Physical including HPI and review of systems are unable to be completely obtained due to patient chronic dementia  HPI Stephanie Graham is a 86 y.o. female with a history of dementia diabetes hypertension hyperlipidemia CAD and CHF who is brought to the ED due to shortness of breath today.  No cough or fever.  No falls or injury.  No vomiting or diarrhea.  Eating and drinking normally.  Taking her medication.  Patient denies any complaints  Caregiver at bedside notes that patient was out of the house and did a lot of walking yesterday, much more than normal due to a hearing about guardianship.  Patient ambulates with a walker.   Caregiver is worried that the patient is having shortness of breath due to perceived increased depth of respirations.  She has NP home visits for primary care as needed, but when she called this morning they advised ED evaluation to avoid potential delay in care.  Caregiver also notes the patient has had rhinorrhea for the past 4 or 5 days, not improved with Zyrtec and Flonase.   Past Medical History:  Diagnosis Date  . 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 Dementia (HCC)   . Diabetes mellitus without complication (HCC)   . Fall at home, initial encounter 12/18/2018  . Hypertension      Patient Active Problem List   Diagnosis Date Noted  . AKI (acute kidney injury) (HCC)   . Hallucinations   . AMS (altered mental status) 07/27/2019  . Subdural fluid collection 07/27/2019  . Systolic CHF (HCC) 07/27/2019  . Acute blood loss anemia 12/19/2018  . Thrombocytopenia (HCC) 12/19/2018  . Protein-calorie  malnutrition, severe 12/19/2018  . Left displaced femoral neck fracture (HCC) 12/18/2018  . Fall at home, initial encounter 12/18/2018  . Dementia with behavioral disturbance (HCC) 12/18/2018  . Hypertension   . Diabetes mellitus without complication (HCC)   . 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 Malnutrition of moderate degree 02/24/2018  . Hypothermia 02/21/2018  . 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 Syncope and collapse 01/18/2017  . HTN (hypertension) 01/18/2017  . Diabetes (HCC) 01/18/2017  . Nausea vomiting and diarrhea 01/18/2017     Past Surgical History:  Procedure Laterality Date  . HIP ARTHROPLASTY Left 12/18/2018   Procedure: ARTHROPLASTY BIPOLAR HIP (HEMIARTHROPLASTY);  Surgeon: Juanell FairlyKrasinski, Kevin, MD;  Location: [facility] ORS;  Service: Orthopedics;  Laterality: Left;  . REPLACEMENT TOTAL KNEE Left      Prior to Admission medications   Medication Sig Start Date End Date Taking? Authorizing Provider  acetaminophen (TYLENOL) 650 MG CR tablet Take 1 tablet (650 mg total) by mouth every 8 (eight) hours as needed for pain. 12/21/18   Standley BrookingGoodrich, Daniel P, MD  Calcium 600-400 MG-UNIT CHEW Chew 2 tablets by mouth in the morning and at bedtime.    [provider]  carvedilol (COREG) 6.25 MG tablet Take 1 tablet (6.25 mg total) by mouth 2 (two) times daily. 08/01/19   Arnetha CourserAmin, Sumayya, MD  donepezil (ARICEPT) 10 MG tablet Take 10 mg by mouth at bedtime. 02/06/16   [provider]  feeding supplement, GLUCERNA SHAKE, (GLUCERNA SHAKE) LIQD  Take 237 mLs by mouth 3 (three) times daily between meals. 12/21/18   Standley Brooking, MD  loratadine (CLARITIN) 10 MG tablet Take 10 mg by mouth daily as needed for allergies or rhinitis.     [provider]  losartan (COZAAR) 100 MG tablet Take 1 tablet (100 mg total) by mouth daily. 08/02/19   Arnetha Courser, MD  metFORMIN (GLUCOPHAGE) 500 MG tablet Take 1 tablet (500 mg total) by mouth 2 (two)  times daily. 02/24/18   Mayo, Allyn Kenner, MD  mirtazapine (REMERON) 15 MG tablet Take 15 mg by mouth at bedtime. 06/06/19   [provider]  Multiple Vitamin (MULTIVITAMIN WITH MINERALS) TABS tablet Take 1 tablet by mouth daily. 12/22/18   Standley Brooking, MD  polyethylene glycol (MIRALAX / GLYCOLAX) 17 g packet Take 34 g by mouth daily.    [provider]  pravastatin (PRAVACHOL) 80 MG tablet Take 80 mg by mouth at bedtime.     [provider]  QUEtiapine (SEROQUEL) 50 MG tablet Take 1 tablet (50 mg total) by mouth at bedtime. 10/18/18   Loleta Rose, MD  sertraline (ZOLOFT) 50 MG tablet Take 75 mg by mouth at bedtime.     [provider]  divalproex (DEPAKOTE SPRINKLE) 125 MG capsule Take 125-250 mg by mouth 2 (two) times daily.  10/07/18 10/18/18  [provider]     Allergies Hydrochlorothiazide w-triamterene, Simvastatin, and Verapamil   History reviewed. No pertinent family history.  Social History Social History   Tobacco Use  . Smoking status: Never Smoker  . Smokeless tobacco: Former Engineer, water Use Topics  . Alcohol use: No  . Drug use: No    Review of Systems Level 5 Caveat: Portions of the History and Physical including HPI and review of systems are unable to be completely obtained due to patient being a poor historian   Constitutional:   No known fever.  ENT:   No rhinorrhea. Cardiovascular:   No chest pain or syncope. Respiratory:   Positive shortness of breath without cough. Gastrointestinal:   Negative for abdominal pain, vomiting and diarrhea.  Musculoskeletal:   Negative for focal pain or swelling ____________________________________________   PHYSICAL EXAM:  VITAL SIGNS: ED Triage Vitals  Enc Vitals Group     BP 11/16/19 0947 139/62     Pulse Rate 11/16/19 0947 69     Resp 11/16/19 0947 20     Temp 11/16/19 0947 98.3 F (36.8 C)     Temp Source 11/16/19 0947 Oral     SpO2 11/16/19 0947 98 %     Weight  11/16/19 0947 126 lb (57.2 kg)     Height 11/16/19 0947 5\' 3"  (1.6 m)     Head Circumference --      Peak Flow --      Pain Score 11/16/19 0946 0     Pain Loc --      Pain Edu? --      Excl. in GC? --     Vital signs reviewed, nursing assessments reviewed.   Constitutional:   Alert and oriented to self. Non-toxic appearance. Eyes:   Conjunctivae are normal. EOMI. PERRL. ENT      Head:   Normocephalic and atraumatic.      Nose:   No congestion/rhinnorhea.       Mouth/Throat:   MMM, no pharyngeal erythema. No peritonsillar mass.       Neck:   No meningismus. Full ROM. Hematological/Lymphatic/Immunilogical:   No  cervical lymphadenopathy. Cardiovascular:   RRR. Symmetric bilateral radial and DP pulses.  No murmurs. Cap refill less than 2 seconds. Respiratory:   Normal respiratory effort without tachypnea/retractions. Breath sounds are clear and equal bilaterally. No wheezes/rales/rhonchi. Gastrointestinal:   Soft and nontender. Non distended. There is no CVA tenderness.  No rebound, rigidity, or guarding.  Musculoskeletal:   Normal range of motion in all extremities. No joint effusions.  No lower extremity tenderness.  No edema. Neurologic:   Normal speech and language.  Motor grossly intact. No acute focal neurologic deficits are appreciated.  Skin:    Skin is warm, dry and intact. No rash noted.  No petechiae, purpura, or bullae.  ____________________________________________    LABS (pertinent positives/negatives) (all labs ordered are listed, but only abnormal results are displayed) Labs Reviewed  BASIC METABOLIC PANEL - Abnormal; Notable for the following components:      Result Value   Glucose, Bld 167 (*)    Creatinine, Ser 1.17 (*)    GFR, Estimated 43 (*)    All other components within normal limits  CBC - Abnormal; Notable for the following components:   WBC 3.7 (*)    RBC 3.70 (*)    Hemoglobin 11.3 (*)    HCT 35.8 (*)    All other components within normal  limits  URINALYSIS, COMPLETE (UACMP) WITH MICROSCOPIC - Abnormal; Notable for the following components:   Color, Urine YELLOW (*)    APPearance HAZY (*)    All other components within normal limits  TROPONIN I (HIGH SENSITIVITY) - Abnormal; Notable for the following components:   Troponin I (High Sensitivity) 46 (*)    All other components within normal limits  TROPONIN I (HIGH SENSITIVITY) - Abnormal; Notable for the following components:   Troponin I (High Sensitivity) 49 (*)    All other components within normal limits  RESPIRATORY PANEL BY RT PCR (FLU A&B, COVID)   ____________________________________________   EKG  Interpreted by me Sinus rhythm rate of 75, normal axis.  First-degree AV block, left bundle branch block.  No acute ischemic changes.  ____________________________________________    RADIOLOGY  DG Chest 2 View  Result Date: 11/16/2019 CLINICAL DATA:  Shortness of breath EXAM: CHEST - 2 VIEW COMPARISON:  July 26, 2019 FINDINGS: There is stable interstitial thickening. There is no edema or airspace opacity. Heart is mildly enlarged with pulmonary vascularity normal. No adenopathy. Bones are osteoporotic. IMPRESSION: Stable interstitial thickening, a finding likely indicative of a degree of chronic bronchitis. No edema or airspace opacity. Stable mild cardiomegaly. No adenopathy evident. Electronically Signed   By: Bretta Bang III M.D.   On: 11/16/2019 11:05    ____________________________________________   PROCEDURES Procedures  ____________________________________________  DIFFERENTIAL DIAGNOSIS   Fatigue, dehydration, pulmonary edema, pleural effusion, non-STEMI, Covid, viral URI  CLINICAL IMPRESSION / ASSESSMENT AND PLAN / ED COURSE  Medications ordered in the ED: Medications - No data to display  Pertinent labs & imaging results that were available during my care of the patient were reviewed by me and considered in my medical decision making  (see chart for details).   Stephanie Graham was evaluated in Emergency Department on 11/16/2019 for the symptoms described in the history of present illness. She was evaluated in the context of the global COVID-19 pandemic, which necessitated consideration that the patient might be at risk for infection with the SARS-CoV-2 virus that causes COVID-19. Institutional protocols and algorithms that pertain to the evaluation of patients at risk for COVID-19 are  in a state of rapid change based on information released by regulatory bodies including the CDC and federal and state organizations. These policies and algorithms were followed during the patient's care in the ED.   Patient brought to ED with concern for shortness of breath.  She is nontoxic, exam is benign, vital signs are normal.  No JVD, pulmonary crackles, or peripheral edema on exam, presentation not consistent with CHF.   Chest x-ray image viewed by me, appears unremarkable.  Radiology report agrees, no acute findings, suggestive of chronic bronchitis.    Will check labs and Covid swab.  Clinical Course as of Nov 16 1338  Wed Nov 16, 2019  1337 Work-up entirely unremarkable, troponins, Covid negative, UA normal.   [PS]    Clinical Course User Index [PS] Sharman Cheek, MD     ____________________________________________   FINAL CLINICAL IMPRESSION(S) / ED DIAGNOSES    Final diagnoses:  Fatigue, unspecified type  Type 2 diabetes mellitus without complication, without long-term current use of insulin (HCC)  Chronic dementia without behavioral disturbance [facility])     ED Discharge Orders    None      Portions of this note were generated with dragon dictation software. Dictation errors may occur despite best attempts at proofreading.   Sharman Cheek, MD 11/16/19 1340

## 2019-11-16 NOTE — ED Notes (Signed)
Applesauce and cranberry juice provided to pt.

## 2019-11-16 NOTE — ED Notes (Signed)
Discharge paperwork reviewed with sister at bedside. Topaz pad not working.

## 2019-12-06 ENCOUNTER — Telehealth: Payer: Self-pay

## 2019-12-06 NOTE — Telephone Encounter (Signed)
Received message from [facility] nurse that daughter is concerned about patient having more shortness of breath and decreasing appetite. Patient is now only eating one meal per day and a milk shake. Patient was eating up to 3 meals per day. Visit scheduled for 12/07/19 @ 2pm.

## 2019-12-07 ENCOUNTER — Encounter: Payer: Self-pay | Admitting: Nurse Practitioner

## 2019-12-07 ENCOUNTER — Other Ambulatory Visit: Payer: Medicare Other | Admitting: Nurse Practitioner

## 2019-12-07 ENCOUNTER — Other Ambulatory Visit: Payer: Self-pay

## 2019-12-07 DIAGNOSIS — Z515 Encounter for palliative care: Secondary | ICD-10-CM

## 2019-12-07 DIAGNOSIS — F0390 Unspecified dementia without behavioral disturbance: Secondary | ICD-10-CM

## 2019-12-07 NOTE — Progress Notes (Signed)
Therapist, nutritional Palliative Care Consult Note Telephone: [phone]  Fax: [phone]  PATIENT NAME: Jamyiah Labella DOB: [birth year] MRN: [id number]  PRIMARY CARE PROVIDER:   Marisue Ivan, MD  REFERRING PROVIDER:  Marisue Ivan, MD [phone] [facility] [address] [facility] [place],  [place] [address]  RESPONSIBLE PARTY:Ms. Rober Minion daughter. [phone]  RECOMMENDATIONS and PLAN: 1.ACP:DNR; will send through [facility] to review for eligibility   2.Palliative care encounter; Palliative medicine team will continue to support patient, patient's family, and medical team. Visit consisted of counseling and education dealing with the complex and emotionally intense issues of symptom management and palliative care in the setting of serious and potentially life-threatening illness  3. F/u 1 months for ongoing monitoring of decline, disease progression, appetite  I spent 90 minutes providing this consultation,  Start at 2:00pm to 3:30pm. More than 50% of the time in this consultation was spent coordinating communication.   HISTORY OF PRESENT ILLNESS:  Dejanee Thibeaux is a 86 y.o. year old female with multiple medical problems including CVA, systolic congestive heart failure, dementia with behavioral disturbances, hypertension, diabetes, thrombocytopenia, protein calorie malnutrition, anemia, blindness right eye.Seen in the Emergency Department at [facility] 10 / 27 / 2021 for shortness of breath with edema. Workup significant for fatigue with discharge home. In-person visit with Ms Nakama and her daughter Para March for follow-up palliative care visit. We talked about purpose of Palliative care visit. Jeanette in agreement. We talked about guardianship as Para March recently went to court and was awarded guardianship for Ms Thomassen. We talked about recent visit to the Emergency Department for fatigue and shortness of breath the workout did  not show and identifiable cause. Para March endorses the shortness of breath continues. Ms. Yau does appear to be having more trouble throughout the day. Para March endorses they do have nebulizers and she has been giving her treatments twice a day. Para March endorses appetite has really declined. Takes Ms. Diodato around 20 to 30 minutes to eat a meal with varied appetite. Ms Marzan does feed herself. Ms Tat does ambulate with a walker a few steps at a time and then has to sit down. Gate is unsteady. Ms. Nix does require assistance from a sitting to standing position. Ms Wenk does require total ADL dependents for bathing, dressing, toileting. No recent infections, wounds, falls. Para March endorses noting an overall decline in Ms Brownlee's Health. We talked about sleep patterns for which Ms Papadopoulos does sleep at night. We talked about medical goals of care. We talked about wishes are to remain at home, focus on comfort. We talked about role of Palliative care and plan of care. We talked about Hospice benefit through Medicare, what services will be provided and what eligibility would look like. We talked about asking the [facility] to review case for eligibility. Para March agree to forward having [facility] evaluate for eligibility. We talked about natural disease progression. We talked about progression of natural aging with changes. Discussed with Para March will recontact after [facility] team eligibility. Ms Buzan was cooperative with assessment. Therapeutic listening, emotional support provided. Ms Voshell is a DNR. Jeanette in agreement. Questions answered to satisfaction. Contact information.  Left arm 6 1/2 inches Left thigh 11 inches 42 lbs weight loss with 30% loss  Palliative Care was asked to help to continue to address goals of care.   CODE STATUS: DNR  PPS: 40% HOSPICE ELIGIBILITY/DIAGNOSIS: TBD  PAST MEDICAL HISTORY:  Past Medical History:    Diagnosis Date  .  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 Dementia (HCC)   . Diabetes mellitus without complication (HCC)   . Fall at home, initial encounter 12/18/2018  . Hypertension     SOCIAL HX:  Social History   Tobacco Use  . Smoking status: Never Smoker  . Smokeless tobacco: Former Engineer, water Use Topics  . Alcohol use: No    ALLERGIES:  Allergies  Allergen Reactions  . Hydrochlorothiazide W-Triamterene     Other reaction(s): Unknown  . Simvastatin     Other reaction(s): Unknown  . Verapamil     Other reaction(s): Unknown      PHYSICAL EXAM:   General: frail appearing, thin, oriented to self female Cardiovascular: regular rate and rhythm Pulmonary: clear ant fields Extremities: no edema, no joint deformities; muscle wasting Neurological: generalized weakness, unsteady gait  Ira Busbin Prince [place], NP

## 2019-12-12 ENCOUNTER — Other Ambulatory Visit: Payer: Medicare Other | Admitting: Nurse Practitioner

## 2020-01-10 ENCOUNTER — Other Ambulatory Visit: Payer: Self-pay

## 2020-01-10 ENCOUNTER — Emergency Department
Admission: EM | Admit: 2020-01-10 | Discharge: 2020-01-10 | Disposition: A | Discharge status: Home / Self Care | Attending: Emergency Medicine | Admitting: Emergency Medicine

## 2020-01-10 ENCOUNTER — Emergency Department

## 2020-01-10 DIAGNOSIS — R079 Chest pain, unspecified: Secondary | ICD-10-CM | POA: Insufficient documentation

## 2020-01-10 DIAGNOSIS — I5020 Unspecified systolic (congestive) heart failure: Secondary | ICD-10-CM | POA: Insufficient documentation

## 2020-01-10 DIAGNOSIS — I110 Hypertensive heart disease with heart failure: Secondary | ICD-10-CM | POA: Insufficient documentation

## 2020-01-10 DIAGNOSIS — F0390 Unspecified dementia without behavioral disturbance: Secondary | ICD-10-CM | POA: Insufficient documentation

## 2020-01-10 DIAGNOSIS — E119 Type 2 diabetes mellitus without complications: Secondary | ICD-10-CM | POA: Insufficient documentation

## 2020-01-10 DIAGNOSIS — Z79899 Other long term (current) drug therapy: Secondary | ICD-10-CM | POA: Insufficient documentation

## 2020-01-10 DIAGNOSIS — Z96642 Presence of left artificial hip joint: Secondary | ICD-10-CM | POA: Insufficient documentation

## 2020-01-10 DIAGNOSIS — Z515 Encounter for palliative care: Secondary | ICD-10-CM | POA: Diagnosis not present

## 2020-01-10 DIAGNOSIS — Z7984 Long term (current) use of oral hypoglycemic drugs: Secondary | ICD-10-CM | POA: Insufficient documentation

## 2020-01-10 DIAGNOSIS — Z96652 Presence of left artificial knee joint: Secondary | ICD-10-CM | POA: Diagnosis not present

## 2020-01-10 LAB — CBC WITH DIFFERENTIAL/PLATELET
Abs Immature Granulocytes: 0.01 10*3/uL (ref 0.00–0.07)
Basophils Absolute: 0 10*3/uL (ref 0.0–0.1)
Basophils Relative: 0 %
Eosinophils Absolute: 0.1 10*3/uL (ref 0.0–0.5)
Eosinophils Relative: 2 %
HCT: 38.3 % (ref 36.0–46.0)
Hemoglobin: 11.7 g/dL — ABNORMAL LOW (ref 12.0–15.0)
Immature Granulocytes: 0 %
Lymphocytes Relative: 21 %
Lymphs Abs: 1 10*3/uL (ref 0.7–4.0)
MCH: 29 pg (ref 26.0–34.0)
MCHC: 30.5 g/dL (ref 30.0–36.0)
MCV: 94.8 fL (ref 80.0–100.0)
Monocytes Absolute: 0.4 10*3/uL (ref 0.1–1.0)
Monocytes Relative: 8 %
Neutro Abs: 3.1 10*3/uL (ref 1.7–7.7)
Neutrophils Relative %: 69 %
Platelets: 199 10*3/uL (ref 150–400)
RBC: 4.04 MIL/uL (ref 3.87–5.11)
RDW: 14.8 % (ref 11.5–15.5)
WBC: 4.6 10*3/uL (ref 4.0–10.5)
nRBC: 0 % (ref 0.0–0.2)

## 2020-01-10 LAB — BASIC METABOLIC PANEL
Anion gap: 9 (ref 5–15)
BUN: 26 mg/dL — ABNORMAL HIGH (ref 8–23)
CO2: 26 mmol/L (ref 22–32)
Calcium: 9 mg/dL (ref 8.9–10.3)
Chloride: 103 mmol/L (ref 98–111)
Creatinine, Ser: 1.23 mg/dL — ABNORMAL HIGH (ref 0.44–1.00)
GFR, Estimated: 40 mL/min — ABNORMAL LOW (ref >60)
Glucose, Bld: 187 mg/dL — ABNORMAL HIGH (ref 70–99)
Potassium: 4.7 mmol/L (ref 3.5–5.1)
Sodium: 138 mmol/L (ref 135–145)

## 2020-01-10 LAB — TROPONIN I (HIGH SENSITIVITY)
Troponin I (High Sensitivity): 81 ng/L — ABNORMAL HIGH (ref <18)
Troponin I (High Sensitivity): 88 ng/L — ABNORMAL HIGH (ref <18)

## 2020-01-10 MED ORDER — HALOPERIDOL LACTATE 2 MG/ML PO CONC
1.0000 mg | Freq: Once | ORAL | Status: AC
  Administered 2020-01-10: 10:00:00 1 mg via ORAL
  Filled 2020-01-10: qty 0.5

## 2020-01-10 MED ORDER — LORAZEPAM 2 MG/ML IJ SOLN
0.5000 mg | Freq: Once | INTRAMUSCULAR | Status: AC
  Administered 2020-01-10: 12:00:00 0.5 mg via INTRAVENOUS
  Filled 2020-01-10: qty 1

## 2020-01-10 NOTE — Progress Notes (Signed)
[facility] hospital liaison note:  ED visit made. Patient is currently followed by [facility] hospice service at home with a hospice diagnosis of hypertensive heart disease. She was brought to the [facility] ED today for evaluation of chest pain at request of her daughter. Hospice was notified that EMS had been called, family chose not to wait for RN to visit.  Patient seen sitting on the ED stretcher, alert, oriented to self and daughter Para March who was present at bedside. Patient denied any pain, but did remain restless for the entire visits. Home dose of haldol had been given prior to writers visit. EDP Dr. Larinda Buttery in during visit and discussed lab results and CXR with [facility], she voiced understanding. Plan was to discharge home via EMS. Para March is agreeable to this.  Patient became increasingly restlessness, not calmed by voice, touch or warm blanket. Discussed with staff RN Joice Lofts and patient's daughter Para March. Amber obtained new order for IV ativan 0.5 mg. Writer stayed with patient and her daughter until EMS arrived for transport. Hospice team alerted to discharge via telephone and email. Dayna Barker BSN, RN [facility] [employer] [phone]

## 2020-01-10 NOTE — ED Notes (Signed)
[facility]  CALLED  TO TRANSPORT PT  HOME 

## 2020-01-10 NOTE — ED Notes (Signed)
Hospice at bedside 

## 2020-01-10 NOTE — ED Notes (Signed)
Patient given orange juice

## 2020-01-10 NOTE — ED Notes (Signed)
Pt trying to climb out of bed. Helped back in bed with hospice at bedside

## 2020-01-10 NOTE — ED Notes (Signed)
Patient helped to bathroom by this RN and daughter. Pad in brief changed

## 2020-01-10 NOTE — ED Notes (Signed)
Patient transported to X-ray 

## 2020-01-10 NOTE — ED Triage Notes (Signed)
Patient from home with complaints of chest pain per daughter. Ems states on arrival she was clutching her chest in pain. Patient with hx of dementia. Pt also on hospice care. EKG obtained by ems.

## 2020-01-10 NOTE — ED Provider Notes (Signed)
[facility] Emergency Department Provider Note   ____________________________________________   Event Date/Time   First MD Initiated Contact with Patient 01/10/20 [phone]     (approximate)  I have reviewed the triage vital signs and the nursing notes.   HISTORY  Chief Complaint Chest Pain    HPI Stephanie Graham is a 86 y.o. female with past medical history of dementia, hypertension, diabetes, and CHF who presents to the ED for chest pain.  History is limited due to patient's baseline dementia.  Per EMS, patient started complaining of pain in her chest this morning and daughter noticed that she was clutching her chest at times.  Daughter states that patient started complaining of chest pain this morning but that her breathing has seemed normal and she has not had any cough.  Patient was nonverbal with EMS and unable to follow commands.  When asked if she is in pain, patient states "not anymore."  She is unable to further elaborate on pain that she was having previously.  Patient is currently receiving hospice care, has been taking oral morphine and haloperidol as needed at home.        Past Medical History:  Diagnosis Date  . 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 Dementia (HCC)   . Diabetes mellitus without complication (HCC)   . Fall at home, initial encounter 12/18/2018  . Hypertension     Patient Active Problem List   Diagnosis Date Noted  . AKI (acute kidney injury) (HCC)   . Hallucinations   . AMS (altered mental status) 07/27/2019  . Subdural fluid collection 07/27/2019  . Systolic CHF (HCC) 07/27/2019  . Acute blood loss anemia 12/19/2018  . Thrombocytopenia (HCC) 12/19/2018  . Protein-calorie malnutrition, severe 12/19/2018  . Left displaced femoral neck fracture (HCC) 12/18/2018  . Fall at home, initial encounter 12/18/2018  . Dementia with behavioral disturbance (HCC) 12/18/2018  . Hypertension   .  Diabetes mellitus without complication (HCC)   . 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 Malnutrition of moderate degree 02/24/2018  . Hypothermia 02/21/2018  . 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 Syncope and collapse 01/18/2017  . HTN (hypertension) 01/18/2017  . Diabetes (HCC) 01/18/2017  . Nausea vomiting and diarrhea 01/18/2017    Past Surgical History:  Procedure Laterality Date  . HIP ARTHROPLASTY Left 12/18/2018   Procedure: ARTHROPLASTY BIPOLAR HIP (HEMIARTHROPLASTY);  Surgeon: Juanell Fairly, MD;  Location: [facility] ORS;  Service: Orthopedics;  Laterality: Left;  . REPLACEMENT TOTAL KNEE Left     Prior to Admission medications   Medication Sig Start Date End Date Taking? Authorizing Provider  acetaminophen (TYLENOL) 650 MG CR tablet Take 1 tablet (650 mg total) by mouth every 8 (eight) hours as needed for pain. 12/21/18   Standley Brooking, MD  Calcium 600-400 MG-UNIT CHEW Chew 2 tablets by mouth in the morning and at bedtime.    [provider]  carvedilol (COREG) 6.25 MG tablet Take 1 tablet (6.25 mg total) by mouth 2 (two) times daily. 08/01/19   Arnetha Courser, MD  donepezil (ARICEPT) 10 MG tablet Take 10 mg by mouth at bedtime. 02/06/16   [provider]  feeding supplement, GLUCERNA SHAKE, (GLUCERNA SHAKE) LIQD Take 237 mLs by mouth 3 (three) times daily between meals. 12/21/18   Standley Brooking, MD  loratadine (CLARITIN) 10 MG tablet Take 10 mg by mouth daily as needed for allergies or rhinitis.  [provider]  losartan (COZAAR) 100 MG tablet Take 1 tablet (100 mg total) by mouth daily. 08/02/19   Arnetha Courser, MD  metFORMIN (GLUCOPHAGE) 500 MG tablet Take 1 tablet (500 mg total) by mouth 2 (two) times daily. 02/24/18   Mayo, Allyn Kenner, MD  mirtazapine (REMERON) 15 MG tablet Take 15 mg by mouth at bedtime. 06/06/19   [provider]  Multiple Vitamin (MULTIVITAMIN WITH MINERALS) TABS tablet Take 1 tablet by  mouth daily. 12/22/18   Standley Brooking, MD  polyethylene glycol (MIRALAX / GLYCOLAX) 17 g packet Take 34 g by mouth daily.    [provider]  pravastatin (PRAVACHOL) 80 MG tablet Take 80 mg by mouth at bedtime.     [provider]  QUEtiapine (SEROQUEL) 50 MG tablet Take 1 tablet (50 mg total) by mouth at bedtime. 10/18/18   Loleta Rose, MD  sertraline (ZOLOFT) 50 MG tablet Take 75 mg by mouth at bedtime.     [provider]  divalproex (DEPAKOTE SPRINKLE) 125 MG capsule Take 125-250 mg by mouth 2 (two) times daily.  10/07/18 10/18/18  [provider]    Allergies Hydrochlorothiazide w-triamterene, Simvastatin, and Verapamil  No family history on file.  Social History Social History   Tobacco Use  . Smoking status: Never Smoker  . Smokeless tobacco: Former Engineer, water Use Topics  . Alcohol use: No  . Drug use: No    Review of Systems Unable to obtain secondary to dementia  ____________________________________________   PHYSICAL EXAM:  VITAL SIGNS: ED Triage Vitals  Enc Vitals Group     BP      Pulse      Resp      Temp      Temp src      SpO2      Weight      Height      Head Circumference      Peak Flow      Pain Score      Pain Loc      Pain Edu?      Excl. in GC?     Constitutional: Awake and alert, disoriented. Eyes: Conjunctivae are normal. Head: Atraumatic. Nose: No congestion/rhinnorhea. Mouth/Throat: Mucous membranes are moist. Neck: Normal ROM Cardiovascular: Normal rate, regular rhythm. Grossly normal heart sounds.  No chest wall tenderness to palpation.  2+ radial pulses bilaterally. Respiratory: Normal respiratory effort.  No retractions. Lungs CTAB. Gastrointestinal: Soft and nontender. No distention. Genitourinary: deferred Musculoskeletal: No lower extremity tenderness nor edema. Neurologic:  Normal speech and language. No gross focal neurologic deficits are appreciated. Skin:  Skin is warm, dry  and intact. No rash noted. Psychiatric: Mood and affect are normal. Speech and behavior are normal.  ____________________________________________   LABS (all labs ordered are listed, but only abnormal results are displayed)  Labs Reviewed  CBC WITH DIFFERENTIAL/PLATELET - Abnormal; Notable for the following components:      Result Value   Hemoglobin 11.7 (*)    All other components within normal limits  BASIC METABOLIC PANEL - Abnormal; Notable for the following components:   Glucose, Bld 187 (*)    BUN 26 (*)    Creatinine, Ser 1.23 (*)    GFR, Estimated 40 (*)    All other components within normal limits  TROPONIN I (HIGH SENSITIVITY) - Abnormal; Notable for the following components:   Troponin I (High Sensitivity) 81 (*)    All other components within normal limits  TROPONIN  I (HIGH SENSITIVITY) - Abnormal; Notable for the following components:   Troponin I (High Sensitivity) 88 (*)    All other components within normal limits   ____________________________________________  EKG  ED ECG REPORT I, Chesley Noon, the attending physician, personally viewed and interpreted this ECG.   Date: 01/10/2020  EKG Time: 8:28  Rate: 101  Rhythm: sinus tachycardia  Axis: Normal  Intervals:left bundle branch block  ST&T Change: None, negative sgarbossa   PROCEDURES  Procedure(s) performed (including Critical Care):  Procedures   ____________________________________________   INITIAL IMPRESSION / ASSESSMENT AND PLAN / ED COURSE       86 year old female with past medical history of hypertension, diabetes, dementia, and CHF who presents to the ED complaining of chest pain per her daughter.  Patient does not appear to be in any pain upon arrival to the ED.  Initial EKG shows left bundle branch block similar to previous, no evidence of arrhythmia or acute ischemic changes.  I explained with the daughter that even if we were to work-up her chest pain, patient would be a poor  candidate for any significant intervention.  She would still like to proceed with work-up including labs and chest x-ray.  I have a low suspicion for PE or dissection at this time.  Daughter is additionally concerned about the medications patient has been receiving at home and the effect they are having on her mental status.  She does state that the Haldol has been working well for her agitation and due to concern for some mild agitation at this time, we will give patient's usual dose of oral Haldol.  We will also reach out to hospice liaison for further assistance.  Chest x-ray reviewed by me and shows right lower lobe infiltrate of unclear significance.  I have a low suspicion for pneumonia given patient has no fever, cough, or leukocytosis.  Troponin mildly elevated but stable on recheck and I have a low suspicion for ACS at this time.  Hospice liaison at bedside and will assist with further medication management for her agitation at home.  Patient is otherwise appropriate for discharge home with palliative care and PCP follow-up.  Daughter agrees with plan.      ____________________________________________   FINAL CLINICAL IMPRESSION(S) / ED DIAGNOSES  Final diagnoses:  Nonspecific chest pain  Dementia without behavioral disturbance, unspecified dementia type [facility])  Hospice care patient     ED Discharge Orders    None       Note:  This document was prepared using Dragon voice recognition software and may include unintentional dictation errors.   Chesley Noon, MD 01/10/20 1134

## 2020-01-10 NOTE — ED Notes (Signed)
Patient trying to climb out of bed

## 2020-02-06 ENCOUNTER — Other Ambulatory Visit: Payer: Self-pay

## 2020-02-06 ENCOUNTER — Inpatient Hospital Stay (HOSPITAL_COMMUNITY)
Admission: EM | Admit: 2020-02-06 | Discharge: 2020-02-21 | DRG: 640 | Disposition: E | Payer: Medicare Other | Attending: Internal Medicine | Admitting: Internal Medicine

## 2020-02-06 ENCOUNTER — Encounter (HOSPITAL_COMMUNITY): Payer: Self-pay | Admitting: Obstetrics and Gynecology

## 2020-02-06 DIAGNOSIS — E870 Hyperosmolality and hypernatremia: Secondary | ICD-10-CM | POA: Diagnosis present

## 2020-02-06 DIAGNOSIS — Z888 Allergy status to other drugs, medicaments and biological substances status: Secondary | ICD-10-CM

## 2020-02-06 DIAGNOSIS — D72829 Elevated white blood cell count, unspecified: Secondary | ICD-10-CM | POA: Diagnosis present

## 2020-02-06 DIAGNOSIS — E119 Type 2 diabetes mellitus without complications: Secondary | ICD-10-CM

## 2020-02-06 DIAGNOSIS — E861 Hypovolemia: Secondary | ICD-10-CM | POA: Diagnosis present

## 2020-02-06 DIAGNOSIS — F0391 Unspecified dementia with behavioral disturbance: Secondary | ICD-10-CM | POA: Diagnosis present

## 2020-02-06 DIAGNOSIS — I110 Hypertensive heart disease with heart failure: Secondary | ICD-10-CM | POA: Diagnosis present

## 2020-02-06 DIAGNOSIS — Z87891 Personal history of nicotine dependence: Secondary | ICD-10-CM | POA: Diagnosis not present

## 2020-02-06 DIAGNOSIS — Z681 Body mass index (BMI) 19 or less, adult: Secondary | ICD-10-CM

## 2020-02-06 DIAGNOSIS — Z66 Do not resuscitate: Secondary | ICD-10-CM | POA: Diagnosis present

## 2020-02-06 DIAGNOSIS — Z96642 Presence of left artificial hip joint: Secondary | ICD-10-CM | POA: Diagnosis present

## 2020-02-06 DIAGNOSIS — Z96652 Presence of left artificial knee joint: Secondary | ICD-10-CM | POA: Diagnosis present

## 2020-02-06 DIAGNOSIS — E860 Dehydration: Secondary | ICD-10-CM | POA: Diagnosis present

## 2020-02-06 DIAGNOSIS — Z8673 Personal history of transient ischemic attack (TIA), and cerebral infarction without residual deficits: Secondary | ICD-10-CM

## 2020-02-06 DIAGNOSIS — Z515 Encounter for palliative care: Secondary | ICD-10-CM | POA: Diagnosis not present

## 2020-02-06 DIAGNOSIS — E11649 Type 2 diabetes mellitus with hypoglycemia without coma: Secondary | ICD-10-CM | POA: Diagnosis not present

## 2020-02-06 DIAGNOSIS — Z79899 Other long term (current) drug therapy: Secondary | ICD-10-CM

## 2020-02-06 DIAGNOSIS — F03918 Unspecified dementia, unspecified severity, with other behavioral disturbance: Secondary | ICD-10-CM | POA: Diagnosis present

## 2020-02-06 DIAGNOSIS — I5022 Chronic systolic (congestive) heart failure: Secondary | ICD-10-CM | POA: Diagnosis present

## 2020-02-06 DIAGNOSIS — N179 Acute kidney failure, unspecified: Secondary | ICD-10-CM | POA: Diagnosis present

## 2020-02-06 DIAGNOSIS — E43 Unspecified severe protein-calorie malnutrition: Secondary | ICD-10-CM | POA: Diagnosis present

## 2020-02-06 DIAGNOSIS — G9341 Metabolic encephalopathy: Secondary | ICD-10-CM | POA: Diagnosis present

## 2020-02-06 DIAGNOSIS — I10 Essential (primary) hypertension: Secondary | ICD-10-CM | POA: Diagnosis not present

## 2020-02-06 DIAGNOSIS — Z7984 Long term (current) use of oral hypoglycemic drugs: Secondary | ICD-10-CM | POA: Diagnosis not present

## 2020-02-06 DIAGNOSIS — U071 COVID-19: Secondary | ICD-10-CM | POA: Diagnosis present

## 2020-02-06 DIAGNOSIS — H5461 Unqualified visual loss, right eye, normal vision left eye: Secondary | ICD-10-CM | POA: Diagnosis present

## 2020-02-06 DIAGNOSIS — I959 Hypotension, unspecified: Secondary | ICD-10-CM | POA: Diagnosis present

## 2020-02-06 LAB — CBC
HCT: 54.2 % — ABNORMAL HIGH (ref 36.0–46.0)
HCT: 52.5 % — ABNORMAL HIGH (ref 36.0–46.0)
Hemoglobin: 16 g/dL — ABNORMAL HIGH (ref 12.0–15.0)
Hemoglobin: 15.2 g/dL — ABNORMAL HIGH (ref 12.0–15.0)
MCH: 29.2 pg (ref 26.0–34.0)
MCH: 28.8 pg (ref 26.0–34.0)
MCHC: 29.5 g/dL — ABNORMAL LOW (ref 30.0–36.0)
MCHC: 29 g/dL — ABNORMAL LOW (ref 30.0–36.0)
MCV: 98.9 fL (ref 80.0–100.0)
MCV: 99.6 fL (ref 80.0–100.0)
Platelets: 107 10*3/uL — ABNORMAL LOW (ref 150–400)
Platelets: 110 10*3/uL — ABNORMAL LOW (ref 150–400)
RBC: 5.48 MIL/uL — ABNORMAL HIGH (ref 3.87–5.11)
RBC: 5.27 MIL/uL — ABNORMAL HIGH (ref 3.87–5.11)
RDW: 16.7 % — ABNORMAL HIGH (ref 11.5–15.5)
RDW: 16.5 % — ABNORMAL HIGH (ref 11.5–15.5)
WBC: 11.4 10*3/uL — ABNORMAL HIGH (ref 4.0–10.5)
WBC: 12.1 10*3/uL — ABNORMAL HIGH (ref 4.0–10.5)
nRBC: 0.4 % — ABNORMAL HIGH (ref 0.0–0.2)
nRBC: 0.4 % — ABNORMAL HIGH (ref 0.0–0.2)

## 2020-02-06 LAB — GLUCOSE, CAPILLARY: Glucose-Capillary: 262 mg/dL — ABNORMAL HIGH (ref 70–99)

## 2020-02-06 LAB — BASIC METABOLIC PANEL
Anion gap: 27 — ABNORMAL HIGH (ref 5–15)
BUN: 131 mg/dL — ABNORMAL HIGH (ref 8–23)
CO2: 28 mmol/L (ref 22–32)
Calcium: 9.4 mg/dL (ref 8.9–10.3)
Chloride: 122 mmol/L — ABNORMAL HIGH (ref 98–111)
Creatinine, Ser: 6.49 mg/dL — ABNORMAL HIGH (ref 0.44–1.00)
GFR, Estimated: 5 mL/min — ABNORMAL LOW (ref >60)
Glucose, Bld: 297 mg/dL — ABNORMAL HIGH (ref 70–99)
Potassium: 3.8 mmol/L (ref 3.5–5.1)
Sodium: 177 mmol/L (ref 135–145)

## 2020-02-06 LAB — CREATININE, SERUM
Creatinine, Ser: 6.11 mg/dL — ABNORMAL HIGH (ref 0.44–1.00)
GFR, Estimated: 6 mL/min — ABNORMAL LOW (ref >60)

## 2020-02-06 LAB — RESP PANEL BY RT-PCR (FLU A&B, COVID) ARPGX2
Influenza A by PCR: NEGATIVE
Influenza B by PCR: NEGATIVE
SARS Coronavirus 2 by RT PCR: POSITIVE — AB

## 2020-02-06 LAB — CBG MONITORING, ED: Glucose-Capillary: 320 mg/dL — ABNORMAL HIGH (ref 70–99)

## 2020-02-06 MED ORDER — ENOXAPARIN SODIUM 30 MG/0.3ML ~~LOC~~ SOLN
30.0000 mg | SUBCUTANEOUS | Status: DC
  Administered 2020-02-06: 30 mg via SUBCUTANEOUS
  Filled 2020-02-06: qty 0.3

## 2020-02-06 MED ORDER — ACETAMINOPHEN 650 MG RE SUPP: 650.0000 mg | Freq: Four times a day (QID) | RECTAL | Status: DC | PRN

## 2020-02-06 MED ORDER — ONDANSETRON HCL 4 MG/2ML IJ SOLN: 4.0000 mg | Freq: Four times a day (QID) | INTRAMUSCULAR | Status: DC | PRN

## 2020-02-06 MED ORDER — INSULIN ASPART 100 UNIT/ML ~~LOC~~ SOLN
0.0000 [IU] | SUBCUTANEOUS | Status: DC
  Administered 2020-02-06: 7 [IU] via SUBCUTANEOUS
  Administered 2020-02-07: 5 [IU] via SUBCUTANEOUS
  Filled 2020-02-06: qty 0.09

## 2020-02-06 MED ORDER — SODIUM CHLORIDE 0.9 % IV BOLUS
1000.0000 mL | Freq: Once | INTRAVENOUS | Status: AC
  Administered 2020-02-06: 1000 mL via INTRAVENOUS

## 2020-02-06 MED ORDER — ACETAMINOPHEN 325 MG PO TABS: 650.0000 mg | ORAL_TABLET | Freq: Four times a day (QID) | ORAL | Status: DC | PRN

## 2020-02-06 MED ORDER — CARVEDILOL 6.25 MG PO TABS
6.2500 mg | ORAL_TABLET | Freq: Two times a day (BID) | ORAL | Status: DC
  Filled 2020-02-06: qty 2

## 2020-02-06 MED ORDER — DEXTROSE 5 % IV SOLN: INTRAVENOUS | Status: DC

## 2020-02-06 MED ORDER — ONDANSETRON HCL 4 MG PO TABS: 4.0000 mg | ORAL_TABLET | Freq: Four times a day (QID) | ORAL | Status: DC | PRN

## 2020-02-06 MED ORDER — ACETAMINOPHEN ER 650 MG PO TBCR: 650.0000 mg | EXTENDED_RELEASE_TABLET | Freq: Three times a day (TID) | ORAL | Status: DC | PRN

## 2020-02-06 MED ORDER — INSULIN DETEMIR 100 UNIT/ML ~~LOC~~ SOLN
10.0000 [IU] | Freq: Two times a day (BID) | SUBCUTANEOUS | Status: DC
  Administered 2020-02-06: 10 [IU] via SUBCUTANEOUS
  Filled 2020-02-06 (×2): qty 0.1

## 2020-02-06 MED ORDER — POLYETHYLENE GLYCOL 3350 17 G PO PACK: 17.0000 g | PACK | Freq: Every day | ORAL | Status: DC | PRN

## 2020-02-06 NOTE — ED Notes (Signed)
Patient's daughter reports her mother was in [place] and wanted her sent out because her skin was dry. Patient reports her mother has not been eating. Patient is known to have a hx of dementia  Patient was a patient of hospice care of [place] and was getting Haldol, Ativan, and other medication for her dementia.

## 2020-02-06 NOTE — H&P (Addendum)
History and Physical  Stephanie Graham LFY:[id number] DOB: 1921-06-25 DOA: Feb 29, 2020  PCP: Marisue Ivan, MD Patient coming from: SNF  I have personally briefly reviewed patient's old medical records in [facility]   Chief Complaint: Altered mental status  HPI: Stephanie Graham is a 87 y.o. female past medical history of advanced dementia, systolic heart failure, diabetes mellitus type 2, who is a hospice patient who resides at a skilled nursing facility who was sent as as per daughter request and is on till 2 weeks ago the patient was in her usual state of health as per daughter ambulating she was admitted to [facility] medical giving Haldol and the daughter believes that since then as her mental status has declined her oral intake has significantly decreased and she has not been able to communicate.  She has burst of aggression because of her dementia and she has not been doing this for the last week.  The daughter relates she has also been taking her ARB and she was getting Motrin for pain.  In the ED:  Hypotensive with a heart rate of 90-96, blood glucose of 297 77 creatinine of 6.4 (with a baseline look around 1.2), mild leukocytosis and hemoglobin of 16 and a platelet count of 107   Review of Systems: All systems reviewed and apart from history of presenting illness, are negative.  Past Medical History:  Diagnosis Date  . 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 Dementia (HCC)   . Diabetes mellitus without complication (HCC)   . Fall at home, initial encounter 12/18/2018  . Hypertension    Past Surgical History:  Procedure Laterality Date  . HIP ARTHROPLASTY Left 12/18/2018   Procedure: ARTHROPLASTY BIPOLAR HIP (HEMIARTHROPLASTY);  Surgeon: Juanell Fairly, MD;  Location: [facility] ORS;  Service: Orthopedics;  Laterality: Left;  . REPLACEMENT TOTAL KNEE Left    Social History:  reports that she has never smoked. She has quit using smokeless  tobacco. She reports that she does not drink alcohol and does not use drugs.   Allergies  Allergen Reactions  . Hydrochlorothiazide W-Triamterene     Other reaction(s): Unknown  . Simvastatin     Other reaction(s): Unknown  . Verapamil     Other reaction(s): Unknown    The daughter cannot tell me what her father and mother died of  Prior to Admission medications   Medication Sig Start Date End Date Taking? Authorizing Provider  acetaminophen (TYLENOL) 650 MG CR tablet Take 1 tablet (650 mg total) by mouth every 8 (eight) hours as needed for pain. 12/21/18   Standley Brooking, MD  Calcium 600-400 MG-UNIT CHEW Chew 2 tablets by mouth in the morning and at bedtime.    [provider]  carvedilol (COREG) 6.25 MG tablet Take 1 tablet (6.25 mg total) by mouth 2 (two) times daily. 08/01/19   Arnetha Courser, MD  donepezil (ARICEPT) 10 MG tablet Take 10 mg by mouth at bedtime. 02/06/16   [provider]  feeding supplement, GLUCERNA SHAKE, (GLUCERNA SHAKE) LIQD Take 237 mLs by mouth 3 (three) times daily between meals. 12/21/18   Standley Brooking, MD  loratadine (CLARITIN) 10 MG tablet Take 10 mg by mouth daily as needed for allergies or rhinitis.     [provider]  losartan (COZAAR) 100 MG tablet Take 1 tablet (100 mg total) by mouth daily. 08/02/19   Arnetha Courser, MD  metFORMIN (GLUCOPHAGE) 500 MG tablet Take 1 tablet (500 mg  total) by mouth 2 (two) times daily. 02/24/18   Mayo, Allyn Kenner, MD  mirtazapine (REMERON) 15 MG tablet Take 15 mg by mouth at bedtime. 06/06/19   [provider]  Multiple Vitamin (MULTIVITAMIN WITH MINERALS) TABS tablet Take 1 tablet by mouth daily. 12/22/18   Standley Brooking, MD  polyethylene glycol (MIRALAX / GLYCOLAX) 17 g packet Take 34 g by mouth daily.    [provider]  pravastatin (PRAVACHOL) 80 MG tablet Take 80 mg by mouth at bedtime.     [provider]  QUEtiapine (SEROQUEL) 50 MG tablet Take 1 tablet (50  mg total) by mouth at bedtime. 10/18/18   Loleta Rose, MD  sertraline (ZOLOFT) 50 MG tablet Take 75 mg by mouth at bedtime.     [provider]  divalproex (DEPAKOTE SPRINKLE) 125 MG capsule Take 125-250 mg by mouth 2 (two) times daily.  10/07/18 10/18/18  [provider]   Physical Exam: Vitals:   Feb 24, 2020 1416 2020-02-24 1551 24-Feb-2020 1623 02/24/2020 1733  BP: 112/68 (!) 89/71 106/65 97/70  Pulse: 72  96   Resp: 19 16 18 19   Temp: (!) 97.2 F (36.2 C)     TempSrc: Axillary     SpO2: 97%  98% 93%     General exam: Cachectic in no acute distress moaning and groaning  Head, eyes and ENT: Nontraumatic and normocephalic.   Neck: Supple. No JVD, carotid bruit or thyromegaly.  Lymphatics: No lymphadenopathy.  Respiratory system: Good air movement and clear to auscultation.  Cardiovascular system: S1 and S2 heard, RRR. No JVD, murmurs, gallops, clicks or pedal edema.  Gastrointestinal system: Abdomen is nondistended, soft and nontender. Normal bowel sounds heard. No organomegaly or masses appreciated.  Central nervous system: Lethargic  Extremities: Symmetric 5 x 5 power. Peripheral pulses symmetrically felt.   Skin: No rashes or acute findings.  Musculoskeletal system: Negative exam.  Psychiatry: Pleasant and cooperative.   Labs on Admission:  Basic Metabolic Panel: Recent Labs  Lab 02-24-2020 1459  NA 177*  K 3.8  CL 122*  CO2 28  GLUCOSE 297*  BUN 131*  CREATININE 6.49*  CALCIUM 9.4   Liver Function Tests: No results for input(s): AST, ALT, ALKPHOS, BILITOT, PROT, ALBUMIN in the last 168 hours. No results for input(s): LIPASE, AMYLASE in the last 168 hours. No results for input(s): AMMONIA in the last 168 hours. CBC: Recent Labs  Lab 24-Feb-2020 1459  WBC 11.4*  HGB 16.0*  HCT 54.2*  MCV 98.9  PLT 107*   Cardiac Enzymes: No results for input(s): CKTOTAL, CKMB, CKMBINDEX, TROPONINI in the last 168 hours.  BNP (last 3 results) No results  for input(s): PROBNP in the last 8760 hours. CBG: No results for input(s): GLUCAP in the last 168 hours.  Radiological Exams on Admission: No results found.  EKG: Independently reviewed.  Sinus tachycardia with a right axis deviation with nonspecific interventricular block, with a prolonged QTC  Assessment/Plan Acute metabolic encephalopathy likely due to hypernatremia: In the setting of decreased oral intake in a patient who is hospice likely hypovolemic in nature, on physical exam she is extremely dehydrated visually her mucous membranes.  She is mildly tachycardic and her blood pressure is borderline. Check urinary sodium. She got a liter of normal saline in the ED was started on D5 check to be made every 4 and will try to bring the sodium no more than 12 mEq in a 24-hour period. I did talk to the daughter about her  CODE STATUS she wants to keep the no code at this point in time, the daughter is in rage due to the treatment that her mother got from her perspective from hospice.  We will address again tomorrow.  Acute kidney injury: Likely prerenal azotemia in the setting of decreased oral intake ACE inhibitor and Motrin use. Will hold ACE discontinue Motrin, will start her on IV fluids and recheck in the morning.  Essential hypertension: I will hold all her IT hypertensive medication. Reevaluate in the morning.  Advanced dementia with behavioral disturbances: The daughter tells me that she sometimes gets aggressive usually in the afternoon when she is in a new setting, and the facility she was on Haldol and Aricept. I will hold these.  As she has a mild prolonged QTC. At this time the patient is lethargic will avoid sedatives.  Diabetes mellitus without complication (HCC) With an elevated blood glucose in the 200s we will start on sliding scale place her n.p.o. due to her encephalopathy started on low-dose Lantus as she will be on D5.  Mild leukocytosis: Likely due to stress  demargination she has remained afebrile we will go ahead and check a UA, her chest x-ray showed an infiltrate on the right but her saturations are preserved she does not have a cough.  Will reevaluate in the morning.  Will not start empiric antibiotics at this time check blood cultures x2. Patient is not septic at this point in time.  Protein-calorie malnutrition, severe N.p.o. for now, will start her on Ensure when she is able to take orals.   DVT Prophylaxis: LOVENOX Code Status: DNR  Family Communication: daughter  Disposition Plan: inpatient    It is my clinical opinion that admission to INPATIENT is reasonable and necessary in this 87 y.o. female presenting with symptoms of acute encephalopathy acute kidney injury in the setting of Motrin decreased oral intake and ACE inhibitor use with a sodium of 177 she is at high risk of decompensating and seizures, we will start her on D5 strict B-metevery 4.  I have explained the risk and benefits of the situation to the daughter and she understands.  Given the aforementioned, the predictability of an adverse outcome is felt to be significant. I expect that the patient will require at least 2 midnights in the hospital to treat this condition.  Marinda Elk MD [facility]   02/05/2020, 6:06 PM

## 2020-02-06 NOTE — ED Provider Notes (Signed)
[facility]-EMERGENCY DEPT Provider Note   CSN: [id number] Arrival date & time: 15-Feb-2020  1359     History Chief Complaint  Patient presents with  . Dementia    Stephanie Graham is a 87 y.o. female with a past medical history of dementia, diabetes, hypertension presenting to the ED for decreased p.o. intake. History is limited secondary to dementia.  HPI     Past Medical History:  Diagnosis Date  . 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 Dementia (HCC)   . Diabetes mellitus without complication (HCC)   . Fall at home, initial encounter 12/18/2018  . Hypertension     Patient Active Problem List   Diagnosis Date Noted  . AKI (acute kidney injury) (HCC)   . Hallucinations   . AMS (altered mental status) 07/27/2019  . Subdural fluid collection 07/27/2019  . Systolic CHF (HCC) 07/27/2019  . Acute blood loss anemia 12/19/2018  . Thrombocytopenia (HCC) 12/19/2018  . Protein-calorie malnutrition, severe 12/19/2018  . Left displaced femoral neck fracture (HCC) 12/18/2018  . Fall at home, initial encounter 12/18/2018  . Dementia with behavioral disturbance (HCC) 12/18/2018  . Hypertension   . Diabetes mellitus without complication (HCC)   . 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 Malnutrition of moderate degree 02/24/2018  . Hypothermia 02/21/2018  . 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 Syncope and collapse 01/18/2017  . HTN (hypertension) 01/18/2017  . Diabetes (HCC) 01/18/2017  . Nausea vomiting and diarrhea 01/18/2017    Past Surgical History:  Procedure Laterality Date  . HIP ARTHROPLASTY Left 12/18/2018   Procedure: ARTHROPLASTY BIPOLAR HIP (HEMIARTHROPLASTY);  Surgeon: Juanell Fairly, MD;  Location: [facility] ORS;  Service: Orthopedics;  Laterality: Left;  . REPLACEMENT TOTAL KNEE Left      OB History   No obstetric history on file.     No family history on file.  Social History    Tobacco Use  . Smoking status: Never Smoker  . Smokeless tobacco: Former Engineer, water Use Topics  . Alcohol use: No  . Drug use: No    Home Medications Prior to Admission medications   Medication Sig Start Date End Date Taking? Authorizing Provider  acetaminophen (TYLENOL) 650 MG CR tablet Take 1 tablet (650 mg total) by mouth every 8 (eight) hours as needed for pain. 12/21/18   Standley Brooking, MD  Calcium 600-400 MG-UNIT CHEW Chew 2 tablets by mouth in the morning and at bedtime.    [provider]  carvedilol (COREG) 6.25 MG tablet Take 1 tablet (6.25 mg total) by mouth 2 (two) times daily. 08/01/19   Arnetha Courser, MD  donepezil (ARICEPT) 10 MG tablet Take 10 mg by mouth at bedtime. 02/06/16   [provider]  feeding supplement, GLUCERNA SHAKE, (GLUCERNA SHAKE) LIQD Take 237 mLs by mouth 3 (three) times daily between meals. 12/21/18   Standley Brooking, MD  loratadine (CLARITIN) 10 MG tablet Take 10 mg by mouth daily as needed for allergies or rhinitis.     [provider]  losartan (COZAAR) 100 MG tablet Take 1 tablet (100 mg total) by mouth daily. 08/02/19   Arnetha Courser, MD  metFORMIN (GLUCOPHAGE) 500 MG tablet Take 1 tablet (500 mg total) by mouth 2 (two) times daily. 02/24/18   Mayo, Allyn Kenner, MD  mirtazapine (REMERON) 15 MG tablet Take 15 mg by mouth at bedtime. 06/06/19   [provider]  Multiple  Vitamin (MULTIVITAMIN WITH MINERALS) TABS tablet Take 1 tablet by mouth daily. 12/22/18   Standley Brooking, MD  polyethylene glycol (MIRALAX / GLYCOLAX) 17 g packet Take 34 g by mouth daily.    [provider]  pravastatin (PRAVACHOL) 80 MG tablet Take 80 mg by mouth at bedtime.     [provider]  QUEtiapine (SEROQUEL) 50 MG tablet Take 1 tablet (50 mg total) by mouth at bedtime. 10/18/18   Loleta Rose, MD  sertraline (ZOLOFT) 50 MG tablet Take 75 mg by mouth at bedtime.     [provider]  divalproex (DEPAKOTE  SPRINKLE) 125 MG capsule Take 125-250 mg by mouth 2 (two) times daily.  10/07/18 10/18/18  [provider]    Allergies    Hydrochlorothiazide w-triamterene, Simvastatin, and Verapamil  Review of Systems   Review of Systems  Unable to perform ROS: Dementia    Physical Exam Updated Vital Signs BP 106/65 (BP Location: Right Arm)   Pulse 96   Temp (!) 97.2 F (36.2 C) (Axillary)   Resp 18   SpO2 98%   Physical Exam Vitals and nursing note reviewed.  Constitutional:      General: She is not in acute distress.    Appearance: She is well-developed and well-nourished.     Comments: Frail, elderly woman.  HENT:     Head: Normocephalic and atraumatic.     Nose: Nose normal.  Eyes:     General: No scleral icterus.       Right eye: No discharge.        Left eye: No discharge.     Extraocular Movements: EOM normal.     Conjunctiva/sclera: Conjunctivae normal.  Cardiovascular:     Rate and Rhythm: Normal rate and regular rhythm.     Pulses: Intact distal pulses.     Heart sounds: Normal heart sounds. No murmur heard. No friction rub. No gallop.   Pulmonary:     Effort: Pulmonary effort is normal. No respiratory distress.     Breath sounds: Normal breath sounds.  Abdominal:     General: Bowel sounds are normal. There is no distension.     Palpations: Abdomen is soft.     Tenderness: There is no abdominal tenderness. There is no guarding.  Musculoskeletal:        General: No edema. Normal range of motion.     Cervical back: Normal range of motion and neck supple.     Comments: Moving all extremities.  She has contracted arms at baseline.  Skin:    General: Skin is warm and dry.     Findings: No rash.  Neurological:     Mental Status: She is alert. Mental status is at baseline.     Motor: No abnormal muscle tone.     Coordination: Coordination normal.     Comments: Unwilling to participate in exam.  Psychiatric:        Mood and Affect: Mood and affect normal.      ED Results / Procedures / Treatments   Labs (all labs ordered are listed, but only abnormal results are displayed) Labs Reviewed  CBC - Abnormal; Notable for the following components:      Result Value   WBC 11.4 (*)    RBC 5.48 (*)    Hemoglobin 16.0 (*)    HCT 54.2 (*)    MCHC 29.5 (*)    RDW 16.7 (*)    Platelets 107 (*)    nRBC 0.4 (*)  All other components within normal limits  BASIC METABOLIC PANEL - Abnormal; Notable for the following components:   Sodium 177 (*)    Chloride 122 (*)    Glucose, Bld 297 (*)    BUN 131 (*)    Creatinine, Ser 6.49 (*)    GFR, Estimated 5 (*)    Anion gap 27 (*)    All other components within normal limits  URINE CULTURE  RESP PANEL BY RT-PCR (FLU A&B, COVID) ARPGX2  URINALYSIS, ROUTINE W REFLEX MICROSCOPIC    EKG None  Radiology No results found.  Procedures .Critical Care Performed by: Dietrich PatesKhatri, Kylena Mole, PA-C Authorized by: Dietrich PatesKhatri, Tashema Tiller, PA-C   Critical care provider statement:    Critical care time (minutes):  45   Critical care was necessary to treat or prevent imminent or life-threatening deterioration of the following conditions:  Circulatory failure, CNS failure or compromise and renal failure   Critical care was time spent personally by me on the following activities:  Development of treatment plan with patient or surrogate, discussions with consultants, obtaining history from patient or surrogate, examination of patient, evaluation of patient's response to treatment, ordering and performing treatments and interventions, ordering and review of laboratory studies, re-evaluation of patient's condition and review of old charts   I assumed direction of critical care for this patient from another provider in my specialty: no     (including critical care time)  Medications Ordered in ED Medications  sodium chloride 0.9 % bolus 1,000 mL (0 mLs Intravenous Stopped 01/26/2020 1634)    ED Course  I have reviewed the triage  vital signs and the nursing notes.  Pertinent labs & imaging results that were available during my care of the patient were reviewed by me and considered in my medical decision making (see chart for details).  Clinical Course as of 02/19/2020 1726  Mon Feb 06, 2020  1629 More history provided by daughter over the phone after she spoke to nurse.  States that mother has had progressive decline over the past several months.  States that she has a decreased appetite and activity level and is under hospice care.  She is concerned that she is dehydrated and she "wants her to die of natural causes when her time comes, not dehydration." Nurse tried to explain natural progression of dementia but patient states she would like to know what is going on causing her mother to progressively decline over the past few months and how to fix it. [HK]  1653 Sodium(!!): 177 [HK]  1653 Creatinine(!): 6.49 [HK]    Clinical Course User Index [HK] Dietrich PatesKhatri, Tauheedah Bok, PA-C   MDM Rules/Calculators/A&P                          87 year old female with past medical history of dementia, diabetes, hypertension presenting to the ED for possible dehydration. History is provided by daughter over the phone. States that up until a few weeks ago, patient was in her usual state of health, was ambulating and was alert. [facility] and was given medication such as Ativan and Haldol. She believes this is what caused her mother's decline in her mental status. She is currently at [facility] and she feels that she is becoming more dehydrated. She does not think that they are offering the patient any food or drink. She was sent to the ER for lab work to rule out dehydration. She states that "this is not  like my mom, she was different less than 2 months ago, I think this is because of all the medicines they have given her that have made her doped up. We need to find the problem and fix it."  Upon arrival here patient is  nonverbal. She is moving all extremities without difficulty. Her abdomen appears soft. Her vital signs are within normal limits. Work-up significant for severe hypernatremia of 177 and AKI with a creatinine of 6.4. I called the daughter again, explained these lab findings and management. I asked about goals of care for patient. Daughter is adamant that she wants patient admitted for full treatment of this severe dehydration and is "not interested in comfort care." I did explain to her the natural progression of dementia towards the end of patient's lives and lack of p.o. intake that would cause these type of abnormalities. However she remains adamant that she would like to treat this. Patient has received 1 L of IV fluids here in the ER. Will admit to medicine service for remainder of management. She remains HDS.   Portions of this note were generated with Scientist, clinical (histocompatibility and immunogenetics). Dictation errors may occur despite best attempts at proofreading.  Final Clinical Impression(s) / ED Diagnoses Final diagnoses:  Hypernatremia  AKI (acute kidney injury) [facility])    Rx / DC Orders ED Discharge Orders    None       Dietrich Pates, PA-C 2020/02/28 1727    Alvira Monday, MD 01/28/2020 1001

## 2020-02-06 NOTE — ED Triage Notes (Addendum)
Patient reports to the ER for decreased PO intake and dementia. Patient is a reported hospice patient and suffers from cardiovascular disease. Patient is from [place]

## 2020-02-06 NOTE — ED Notes (Signed)
RN is aware of Vitals 

## 2020-02-07 ENCOUNTER — Other Ambulatory Visit: Payer: Self-pay

## 2020-02-07 DIAGNOSIS — N179 Acute kidney failure, unspecified: Secondary | ICD-10-CM

## 2020-02-07 DIAGNOSIS — F0391 Unspecified dementia with behavioral disturbance: Secondary | ICD-10-CM

## 2020-02-07 DIAGNOSIS — E43 Unspecified severe protein-calorie malnutrition: Secondary | ICD-10-CM

## 2020-02-07 DIAGNOSIS — E870 Hyperosmolality and hypernatremia: Principal | ICD-10-CM

## 2020-02-07 DIAGNOSIS — G9341 Metabolic encephalopathy: Secondary | ICD-10-CM

## 2020-02-07 DIAGNOSIS — I10 Essential (primary) hypertension: Secondary | ICD-10-CM

## 2020-02-07 LAB — BASIC METABOLIC PANEL
Anion gap: 27 — ABNORMAL HIGH (ref 5–15)
Anion gap: 23 — ABNORMAL HIGH (ref 5–15)
BUN: 153 mg/dL — ABNORMAL HIGH (ref 8–23)
BUN: 150 mg/dL — ABNORMAL HIGH (ref 8–23)
CO2: 26 mmol/L (ref 22–32)
CO2: 18 mmol/L — ABNORMAL LOW (ref 22–32)
Calcium: 8.5 mg/dL — ABNORMAL LOW (ref 8.9–10.3)
Calcium: 8.2 mg/dL — ABNORMAL LOW (ref 8.9–10.3)
Chloride: 121 mmol/L — ABNORMAL HIGH (ref 98–111)
Chloride: 124 mmol/L — ABNORMAL HIGH (ref 98–111)
Creatinine, Ser: 6.92 mg/dL — ABNORMAL HIGH (ref 0.44–1.00)
Creatinine, Ser: 6.94 mg/dL — ABNORMAL HIGH (ref 0.44–1.00)
GFR, Estimated: 5 mL/min — ABNORMAL LOW (ref >60)
GFR, Estimated: 5 mL/min — ABNORMAL LOW (ref >60)
Glucose, Bld: 193 mg/dL — ABNORMAL HIGH (ref 70–99)
Glucose, Bld: 356 mg/dL — ABNORMAL HIGH (ref 70–99)
Potassium: 2.7 mmol/L — CL (ref 3.5–5.1)
Potassium: 3.7 mmol/L (ref 3.5–5.1)
Sodium: 174 mmol/L (ref 135–145)
Sodium: 165 mmol/L (ref 135–145)

## 2020-02-07 LAB — CBC
HCT: 48.9 % — ABNORMAL HIGH (ref 36.0–46.0)
Hemoglobin: 14.2 g/dL (ref 12.0–15.0)
MCH: 29.2 pg (ref 26.0–34.0)
MCHC: 29 g/dL — ABNORMAL LOW (ref 30.0–36.0)
MCV: 100.6 fL — ABNORMAL HIGH (ref 80.0–100.0)
Platelets: 100 10*3/uL — ABNORMAL LOW (ref 150–400)
RBC: 4.86 MIL/uL (ref 3.87–5.11)
RDW: 16.4 % — ABNORMAL HIGH (ref 11.5–15.5)
WBC: 11.8 10*3/uL — ABNORMAL HIGH (ref 4.0–10.5)
nRBC: 0.3 % — ABNORMAL HIGH (ref 0.0–0.2)

## 2020-02-07 LAB — D-DIMER, QUANTITATIVE: D-Dimer, Quant: 7.33 ug/mL-FEU — ABNORMAL HIGH (ref 0.00–0.50)

## 2020-02-07 LAB — GLUCOSE, CAPILLARY
Glucose-Capillary: 84 mg/dL (ref 70–99)
Glucose-Capillary: 167 mg/dL — ABNORMAL HIGH (ref 70–99)
Glucose-Capillary: 35 mg/dL — CL (ref 70–99)
Glucose-Capillary: 36 mg/dL — CL (ref 70–99)

## 2020-02-07 LAB — C-REACTIVE PROTEIN: CRP: 16.9 mg/dL — ABNORMAL HIGH (ref <1.0)

## 2020-02-07 LAB — HEMOGLOBIN A1C
Hgb A1c MFr Bld: 7.1 % — ABNORMAL HIGH (ref 4.8–5.6)
Mean Plasma Glucose: 157.07 mg/dL

## 2020-02-07 MED ORDER — SODIUM CHLORIDE 0.45 % IV BOLUS: 500.0000 mL | Freq: Once | INTRAVENOUS | Status: DC

## 2020-02-07 MED ORDER — DEXTROSE 50 % IV SOLN
INTRAVENOUS | Status: AC
  Administered 2020-02-07: 50 mL
  Filled 2020-02-07: qty 50

## 2020-02-07 MED ORDER — POTASSIUM CL IN DEXTROSE 5% 20 MEQ/L IV SOLN
20.0000 meq | INTRAVENOUS | Status: DC
  Administered 2020-02-07: 20 meq via INTRAVENOUS
  Filled 2020-02-07: qty 1000

## 2020-02-07 MED ORDER — DEXTROSE 50 % IV SOLN
INTRAVENOUS | Status: AC
  Filled 2020-02-07: qty 50

## 2020-02-07 MED ORDER — LORAZEPAM 2 MG/ML IJ SOLN
1.0000 mg | INTRAMUSCULAR | Status: DC | PRN
  Administered 2020-02-07: 1 mg via INTRAVENOUS
  Filled 2020-02-07: qty 1

## 2020-02-07 MED ORDER — MORPHINE 100MG IN NS 100ML (1MG/ML) PREMIX INFUSION
1.0000 mg/h | INTRAVENOUS | Status: DC
  Administered 2020-02-07: 1 mg/h via INTRAVENOUS
  Filled 2020-02-07: qty 100

## 2020-02-07 MED ORDER — MORPHINE SULFATE (PF) 2 MG/ML IV SOLN: 2.0000 mg | Freq: Once | INTRAVENOUS | Status: DC

## 2020-02-07 MED ORDER — POTASSIUM CL IN DEXTROSE 5% 20 MEQ/L IV SOLN
20.0000 meq | INTRAVENOUS | Status: DC
  Filled 2020-02-07: qty 1000

## 2020-02-07 MED ORDER — MORPHINE SULFATE (PF) 2 MG/ML IV SOLN
2.0000 mg | Freq: Once | INTRAVENOUS | Status: AC
  Administered 2020-02-07: 2 mg via INTRAVENOUS
  Filled 2020-02-07: qty 1

## 2020-02-07 MED ORDER — HEPARIN SODIUM (PORCINE) 5000 UNIT/ML IJ SOLN: 5000.0000 [IU] | Freq: Three times a day (TID) | INTRAMUSCULAR | Status: DC

## 2020-02-09 LAB — GLUCOSE, CAPILLARY
Glucose-Capillary: 11 mg/dL — CL (ref 70–99)
Glucose-Capillary: 20 mg/dL — CL (ref 70–99)

## 2020-02-11 LAB — CULTURE, BLOOD (ROUTINE X 2)
Culture: NO GROWTH
Special Requests: ADEQUATE

## 2020-02-21 NOTE — Progress Notes (Signed)
I had several discussions with the daughter, this last 1 was over 45 minutes while talking about her prognosis and how she is not responding to IV fluids as her creatinine keeps rising.  I explained to her that overnight she was on D5 and despite this her creatinine progressively got worse she became severely hypoglycemic and her sodium improved mid imaging. The patient had an agonal breathing after explaining this to the daughter she decided to move towards comfort care. Will DC all the medications not related to comfort, will start follow-up Laural Benes was started on IV morphine.  Also use Ativan in case she is agitated. She would like to move towards comfort care and come and visit her mother in the hospital.

## 2020-02-21 NOTE — Progress Notes (Signed)
Wasted 110 mls Morphine in stericycle.  Witnessed by Francesco Sor, RN.

## 2020-02-21 NOTE — Progress Notes (Signed)
Wasted 110 mL morphine in steri cycle.  Witnessed by Priscella Mann, RN

## 2020-02-21 NOTE — Progress Notes (Signed)
Nutrition Brief Note RD working remotely.  Patient screened for MST score of 3. Chart reviewed. Patient is positive for COVID-19.  Pt now transitioning to comfort care per MD note a little under an hour ago.   Patient is NPO. No further nutrition interventions warranted at this time.  Please re-consult as needed.       Trenton Gammon, MS, RD, LDN, CNSC Inpatient Clinical Dietitian RD pager # available in [facility]  After hours/weekend pager # available in [facility]

## 2020-02-21 NOTE — Progress Notes (Addendum)
[facility] PROGRESS NOTE    Progress Note  Stephanie Graham  QIH:[id number] DOB: October 26, 1921 DOA: 2020/02/25 PCP: Marisue Ivan, MD     Brief Narrative:   Stephanie Graham is an 87 y.o. female past medical history of advanced dementia systolic heart failure, diabetes mellitus type 2 who is a hospice patient who resides at a skilled nursing facility who was in her usual state until 2 weeks ago was sent to the ED as requested by her daughter answer mental status had declined when she was more confused.  Assessment/Plan:   Acute metabolic encephalopathy superimposed on dementia, likely due to hyponatremia and infectious etiology: In the setting of decreased oral intake in a hospice patient and she is definitely hypovolemic. She is extremely dehydrated still on physical exam. She continues to be significantly confused, her blood glucose was low this morning insulins were discontinued. She did receive D50 and has been on D5 overnight despite this her blood glucose was significantly low this morning. The patient is agonal this morning I did discuss with the daughter that she is not responding to treatment her sodium has not improved significantly, her creatinine is rising and now she is COVID-positive. The patient is essentially not responding to treatment. Recommended to move towards comfort care, as the patient is not responding to treatment. Daughter said that she would like an hour to to speak to family members in order to come to a decision.  Incidental COVID-19 infection: She is satting greater 92% on room air. Seems to be asymptomatic.  Acute kidney injury: Likely prerenal azotemia in the setting of decreased oral intake, ACE inhibitor and NSAID use. These were held she was started on aggressive IV fluid hydration her creatinine continues to trend up. Patient has an extremely poor prognosis discussed with the daughter this morning that the patient was agonal and we  needed to move towards comfort care.  Essential hypertension: Continue to hold all antihypertensive medication.  Advanced dementia with behavioral disturbances: Hold all sedatives. The patient is agonal this morning.  Diabetes mellitus type 2 without complications: Her blood glucose greater than 200 on admission she was started on sliding scale insulin, was given low-dose long-acting insulin once she was started on D5. This morning her blood glucose 11 she got an amp of D50 and her D5W was increased to 125 despite this her blood glucose continues to be significantly low.  Question if she is becoming septic.  Mild leukocytosis: Continues to be persistently elevated, he has remained afebrile. Culture data has remained negative till date.   DVT prophylaxis: lovenox Family Communication:daughter Status is: Inpatient  Remains inpatient appropriate because:Hemodynamically unstable   Dispo: The patient is from: SNF              Anticipated d/c is to: SNF              Anticipated d/c date is: 2 days              Patient currently is not medically stable to d/c.        Code Status:     Code Status Orders  (From admission, onward)         Start     Ordered   02/25/20 1828  Do not attempt resuscitation (DNR)  Continuous       Question Answer Comment  In the event of cardiac or respiratory ARREST Do not call a "code blue"   In the event of cardiac or respiratory ARREST Do not  perform Intubation, CPR, defibrillation or ACLS   In the event of cardiac or respiratory ARREST Use medication by any route, position, wound care, and other measures to relive pain and suffering. May use oxygen, suction and manual treatment of airway obstruction as needed for comfort.      03/05/20 1827        Code Status History    Date Active Date Inactive Code Status Order ID Comments User Context   08/01/2019 1218 08/01/2019 1941 DNR [id number]  Arnetha Courser, MD Inpatient   07/27/2019 0023 08/01/2019  1218 Full Code [id number]  Anselm Jungling, DO ED   12/18/2018 0241 12/22/2018 0122 Full Code [id number]  Charlane Ferretti, DO Inpatient   02/21/2018 1121 02/24/2018 1710 Full Code [id number]  Adrian Saran, MD ED   01/18/2017 1641 01/19/2017 2119 Full Code [id number]  Marguarite Arbour, MD Inpatient   Advance Care Planning Activity    Advance Directive Documentation   Flowsheet Row Most Recent Value  Type of Advance Directive Out of facility DNR (pink MOST or yellow form)  Pre-existing out of facility DNR order (yellow form or pink MOST form) Yellow form placed in chart (order not valid for inpatient use)  "MOST" Form in Place? -        IV Access:    Peripheral IV   Procedures and diagnostic studies:   No results found.   Medical Consultants:    None.  Anti-Infectives:   none  Subjective:    Stephanie Graham nonverbal  Objective:    Vitals:   Mar 05, 2020 2327 01/23/2020 0200 02/08/2020 0328 02/20/2020 0400  BP: (!) 87/59 92/60 (!) 140/105   Pulse: 66 72 (!) 32 92  Resp: (!) 22 20 (!) 28 (!) 22  Temp:      TempSrc:      SpO2: 94% 93% (!) 54% 92%  Weight:      Height:       SpO2: 92 %   Intake/Output Summary (Last 24 hours) at 02/20/2020 0802 Last data filed at 02/14/2020 0600 Gross per 24 hour  Intake 1767.58 ml  Output 0 ml  Net 1767.58 ml   Filed Weights   03-05-2020 2300  Weight: 43.8 kg    Exam: General exam: Cachectic with agonal breathing Respiratory system: Good air movement and clear to auscultation. Cardiovascular system: S1 & S2 heard, RRR. No JVD. Gastrointestinal system: Abdomen is nondistended, soft and nontender.  Extremities: No pedal edema. Skin: No rashes, lesions or ulcers  Data Reviewed:    Labs: Basic Metabolic Panel: Recent Labs  Lab 03-05-20 1459 2020-03-05 1828 01/29/2020 0206  NA 177*  --  174*  K 3.8  --  2.7*  CL 122*  --  121*  CO2 28  --  26  GLUCOSE 297*  --  193*  BUN 131*  --  153*  CREATININE 6.49* 6.11* 6.92*  CALCIUM 9.4   --  8.5*   GFR Estimated Creatinine Clearance: 3.3 mL/min (A) (by C-G formula based on SCr of 6.92 mg/dL (H)). Liver Function Tests: No results for input(s): AST, ALT, ALKPHOS, BILITOT, PROT, ALBUMIN in the last 168 hours. No results for input(s): LIPASE, AMYLASE in the last 168 hours. No results for input(s): AMMONIA in the last 168 hours. Coagulation profile No results for input(s): INR, PROTIME in the last 168 hours. COVID-19 Labs  No results for input(s): DDIMER, FERRITIN, LDH, CRP in the last 72 hours.  Lab Results  Component Value Date   SARSCOV2NAA POSITIVE (A) 03/05/2020  SARSCOV2NAA NEGATIVE 11/16/2019   SARSCOV2NAA NEGATIVE 07/29/2019   SARSCOV2NAA NEGATIVE 07/26/2019    CBC: Recent Labs  Lab 2020/09/08 1459 2020/09/08 1828 01/27/2020 0206  WBC 11.4* 12.1* 11.8*  HGB 16.0* 15.2* 14.2  HCT 54.2* 52.5* 48.9*  MCV 98.9 99.6 100.6*  PLT 107* 110* 100*   Cardiac Enzymes: No results for input(s): CKTOTAL, CKMB, CKMBINDEX, TROPONINI in the last 168 hours. BNP (last 3 results) No results for input(s): PROBNP in the last 8760 hours. CBG: Recent Labs  Lab 2020/09/08 2356 02/18/2020 0357 02/05/2020 0730 01/24/2020 0748 02/09/2020 0752  GLUCAP 262* 84 35* 11* 20*   D-Dimer: No results for input(s): DDIMER in the last 72 hours. Hgb A1c: No results for input(s): HGBA1C in the last 72 hours. Lipid Profile: No results for input(s): CHOL, HDL, LDLCALC, TRIG, CHOLHDL, LDLDIRECT in the last 72 hours. Thyroid function studies: No results for input(s): TSH, T4TOTAL, T3FREE, THYROIDAB in the last 72 hours.  Invalid input(s): FREET3 Anemia work up: No results for input(s): VITAMINB12, FOLATE, FERRITIN, TIBC, IRON, RETICCTPCT in the last 72 hours. Sepsis Labs: Recent Labs  Lab 2020/09/08 1459 2020/09/08 1828 01/29/2020 0206  WBC 11.4* 12.1* 11.8*   Microbiology Recent Results (from the past 240 hour(s))  Resp Panel by RT-PCR (Flu A&B, Covid) Nasopharyngeal Swab     Status:  Abnormal   Collection Time: 2020/09/08  4:58 PM   Specimen: Nasopharyngeal Swab; Nasopharyngeal(NP) swabs in vial transport medium  Result Value Ref Range Status   SARS Coronavirus 2 by RT PCR POSITIVE (A) NEGATIVE Final    Comment: RESULT CALLED TO, READ BACK BY AND VERIFIED WITH: LAWDERMILK,J. RN @1936  ON 01.17.2022 BY COHEN,K (NOTE) SARS-CoV-2 target nucleic acids are DETECTED.  The SARS-CoV-2 RNA is generally detectable in upper respiratory specimens during the acute phase of infection. Positive results are indicative of the presence of the identified virus, but do not rule out bacterial infection or co-infection with other pathogens not detected by the test. Clinical correlation with patient history and other diagnostic information is necessary to determine patient infection status. The expected result is Negative.  Fact Sheet for Patients: BloggerCourse.comhttps://www.fda.gov/media/152166/download  Fact Sheet for Healthcare Providers: SeriousBroker.ithttps://www.fda.gov/media/152162/download  This test is not yet approved or cleared by the [place] FDA and  has been authorized for detection and/or diagnosis of SARS-CoV-2 by FDA under an Emergency Use Authorization (EUA).  This EUA will remain in effect (meaning th is test can be used) for the duration of  the COVID-19 declaration under Section 564(b)(1) of the Act, 21 U.S.C. section 360bbb-3(b)(1), unless the authorization is terminated or revoked sooner.     Influenza A by PCR NEGATIVE NEGATIVE Final   Influenza B by PCR NEGATIVE NEGATIVE Final    Comment: (NOTE) The Xpert Xpress SARS-CoV-2/FLU/RSV plus assay is intended as an aid in the diagnosis of influenza from Nasopharyngeal swab specimens and should not be used as a sole basis for treatment. Nasal washings and aspirates are unacceptable for Xpert Xpress SARS-CoV-2/FLU/RSV testing.  Fact Sheet for Patients: BloggerCourse.comhttps://www.fda.gov/media/152166/download  Fact Sheet for Healthcare  Providers: SeriousBroker.ithttps://www.fda.gov/media/152162/download  This test is not yet approved or cleared by the [place] FDA and has been authorized for detection and/or diagnosis of SARS-CoV-2 by FDA under an Emergency Use Authorization (EUA). This EUA will remain in effect (meaning this test can be used) for the duration of the COVID-19 declaration under Section 564(b)(1) of the Act, 21 U.S.C. section 360bbb-3(b)(1), unless the authorization is terminated or revoked.  Performed at [facility], [address] [place], [place] [address]      Medications:   . carvedilol  6.25 mg Oral BID  . heparin injection (subcutaneous)  5,000 Units Subcutaneous Q8H   Continuous Infusions: . dextrose 5 % with KCl 20 mEq / L 20 mEq (01/28/2020 0523)  . sodium chloride        LOS: 1 day   Marinda Elk  [facility]  02/02/2020, 8:02 AM

## 2020-02-21 NOTE — Progress Notes (Signed)
CRITICAL VALUE ALERT  Critical Value:  K 2.7  Date & Time Notied:  02/29/20  Provider Notified: Anna Genre, NP  Orders Received/Actions taken: Awaiting new orders.

## 2020-02-21 NOTE — Death Summary Note (Addendum)
Death Summary  Stephanie Graham OJJ:[id number] DOB: Jan 12, 1922 DOA: 02/19/20  PCP: Marisue Ivan, MD  Admit date: 2020-02-19 Date of Death: 02/20/20 Time of Death: 02:34 pm Notification: Marisue Ivan, MD notified of death of 02-21-20   History of present illness:  Stephanie Graham is a 87 y.o. female  past medical history of advanced dementia systolic heart failure, diabetes mellitus type 2 who is a hospice patient who resides at a skilled nursing facility who was in her usual state until 2 weeks ago was sent to the ED as requested by her daughter answer mental status had declined when she was more confused.  Came into the hospital for acute encephalopathy superimposed on dementia likely due to severe hyponatremia and infectious etiology due to COVID-19 and severe acute kidney injury was started on D5W with no improvement in her acute kidney injury or an encephalopathy she had minimal improvement on her hypernatremia. After having a discussion with the family, the daughter decided to move towards comfort care after discussing with the family all the medications were stopped except medication needed for comfort and the patient passed away on 02-19-2020.  Final Diagnoses:  Acute metabolic encephalopathy Advanced dementia with behavioral disturbances Severe hypernatremia COVID-19 infection Severe acute kidney injury Essential hypertension Diabetes mellitus type 2 without complications Mild leukocytosis.    The results of significant diagnostics from this hospitalization (including imaging, microbiology, ancillary and laboratory) are listed below for reference.    Significant Diagnostic Studies: DG Chest 2 View  Result Date: 01/10/2020 CLINICAL DATA:  Chest pain. EXAM: CHEST - 2 VIEW COMPARISON:  November 16, 2019. FINDINGS: Enlarged cardiac silhouette, which is accentuated by AP portable technique. Aortic atherosclerosis. New dense consolidation in the medial right lung  base. Similar chronic diffuse interstitial prominence. No visible pleural effusions or pneumothorax. No acute osseous abnormality. IMPRESSION: 1. New dense consolidation in the medial right lung base, concerning for aspiration or pneumonia. Recommend follow-up to resolution. 2. Cardiomegaly. Electronically Signed   By: Feliberto Harts MD   On: 01/10/2020 09:55    Microbiology: Recent Results (from the past 240 hour(s))  Resp Panel by RT-PCR (Flu A&B, Covid) Nasopharyngeal Swab     Status: Abnormal   Collection Time: February 19, 2020  4:58 PM   Specimen: Nasopharyngeal Swab; Nasopharyngeal(NP) swabs in vial transport medium  Result Value Ref Range Status   SARS Coronavirus 2 by RT PCR POSITIVE (A) NEGATIVE Final    Comment: RESULT CALLED TO, READ BACK BY AND VERIFIED WITH: LAWDERMILK,J. RN @1936  ON 19-Feb-2020 BY COHEN,K (NOTE) SARS-CoV-2 target nucleic acids are DETECTED.  The SARS-CoV-2 RNA is generally detectable in upper respiratory specimens during the acute phase of infection. Positive results are indicative of the presence of the identified virus, but do not rule out bacterial infection or co-infection with other pathogens not detected by the test. Clinical correlation with patient history and other diagnostic information is necessary to determine patient infection status. The expected result is Negative.  Fact Sheet for Patients: 01.19.2022  Fact Sheet for Healthcare Providers: BloggerCourse.com  This test is not yet approved or cleared by the [employer] FDA and  has been authorized for detection and/or diagnosis of SARS-CoV-2 by FDA under an Emergency Use Authorization (EUA).  This EUA will remain in effect (meaning th is test can be used) for the duration of  the COVID-19 declaration under Section 564(b)(1) of the Act, 21 U.S.C. section 360bbb-3(b)(1), unless the authorization is terminated or revoked sooner.      Influenza A by PCR  NEGATIVE NEGATIVE Final   Influenza B by PCR NEGATIVE NEGATIVE Final    Comment: (NOTE) The Xpert Xpress SARS-CoV-2/FLU/RSV plus assay is intended as an aid in the diagnosis of influenza from Nasopharyngeal swab specimens and should not be used as a sole basis for treatment. Nasal washings and aspirates are unacceptable for Xpert Xpress SARS-CoV-2/FLU/RSV testing.  Fact Sheet for Patients: BloggerCourse.com  Fact Sheet for Healthcare Providers: [employer]  This test is not yet approved or cleared by the [place] FDA and has been authorized for detection and/or diagnosis of SARS-CoV-2 by FDA under an Emergency Use Authorization (EUA). This EUA will remain in effect (meaning this test can be used) for the duration of the COVID-19 declaration under Section 564(b)(1) of the Act, 21 U.S.C. section 360bbb-3(b)(1), unless the authorization is terminated or revoked.  Performed at [facility], [address], [place], [place] [address]   Culture, blood (Routine X 2) w Reflex to ID Panel     Status: None (Preliminary result)   Collection Time: 03-04-20  6:13 PM   Specimen: BLOOD  Result Value Ref Range Status   Specimen Description   Final    BLOOD LEFT ANTECUBITAL Performed at [facility], [address] [place], [place] [address]    Special Requests   Final    BOTTLES DRAWN AEROBIC AND ANAEROBIC Blood Culture adequate volume Performed at [facility], [address] [place], [place] [address]    Culture   Final    NO GROWTH 2 DAYS Performed at [facility], [address], [place], [place] [address]    Report Status PENDING  Incomplete     Labs: Basic Metabolic Panel: Recent Labs  Lab March 04, 2020 1459 2020/03/04 1828 01/25/2020 0206 02/08/2020 0853  NA 177*  --  174* 165*  K 3.8  --  2.7* 3.7  CL 122*  --  121* 124*  CO2 28  --  26  18*  GLUCOSE 297*  --  193* 356*  BUN 131*  --  153* 150*  CREATININE 6.49* 6.11* 6.92* 6.94*  CALCIUM 9.4  --  8.5* 8.2*   Liver Function Tests: No results for input(s): AST, ALT, ALKPHOS, BILITOT, PROT, ALBUMIN in the last 168 hours. No results for input(s): LIPASE, AMYLASE in the last 168 hours. No results for input(s): AMMONIA in the last 168 hours. CBC: Recent Labs  Lab 03-04-2020 1459 03/04/2020 1828 01/27/2020 0206  WBC 11.4* 12.1* 11.8*  HGB 16.0* 15.2* 14.2  HCT 54.2* 52.5* 48.9*  MCV 98.9 99.6 100.6*  PLT 107* 110* 100*   Cardiac Enzymes: No results for input(s): CKTOTAL, CKMB, CKMBINDEX, TROPONINI in the last 168 hours. D-Dimer Recent Labs    02/15/2020 0853  DDIMER 7.33*   BNP: Invalid input(s): POCBNP CBG: Recent Labs  Lab 01/29/2020 0730 02/20/2020 0748 02/12/2020 0752 01/22/2020 0758 02/19/2020 0811  GLUCAP 35* 11* 20* 36* 167*   Anemia work up No results for input(s): VITAMINB12, FOLATE, FERRITIN, TIBC, IRON, RETICCTPCT in the last 72 hours. Urinalysis    Component Value Date/Time   COLORURINE YELLOW (A) 11/16/2019 1148   APPEARANCEUR HAZY (A) 11/16/2019 1148   APPEARANCEUR Clear 12/28/2011 2247   LABSPEC 1.012 11/16/2019 1148   LABSPEC 1.013 12/28/2011 2247   PHURINE 6.0 11/16/2019 1148   GLUCOSEU NEGATIVE 11/16/2019 1148   GLUCOSEU 50 mg/dL 79/02/4095 3532   HGBUR NEGATIVE 11/16/2019 1148   BILIRUBINUR NEGATIVE 11/16/2019 1148   BILIRUBINUR Negative 12/28/2011 2247  KETONESUR NEGATIVE 11/16/2019 1148   PROTEINUR NEGATIVE 11/16/2019 1148   NITRITE NEGATIVE 11/16/2019 1148   LEUKOCYTESUR NEGATIVE 11/16/2019 1148   LEUKOCYTESUR Negative 12/28/2011 2247   Sepsis Labs Invalid input(s): PROCALCITONIN,  WBC,  LACTICIDVEN     SIGNED:  Marinda Elk, MD  [facility] 02/08/2020, 3:27 PM Pager   If 7PM-7AM, please contact night-coverage [email or web address] Password [email or web address]

## 2020-02-21 NOTE — Progress Notes (Signed)
CRITICAL VALUE ALERT  Critical Value: Na 174  Date & Time Notied:  01/30/2020  Provider Notified: Anna Genre, NP  Orders Received/Actions taken: Awaiting new orders.

## 2020-02-21 DEATH — deceased

## 2020-04-11 IMAGING — CT CT CERVICAL SPINE W/O CM
2 series · 9 of 27 positions shown, 11 images · non-contrast
Comparison: December 19, 2018

CLINICAL DATA: Fall, dementia

EXAM:
CT HEAD WITHOUT CONTRAST
TECHNIQUE: Contiguous axial images were obtained from the base of the skull
through the vertex without intravenous contrast.

[Series 3: c spine soft · axial · 0.41mm/px · z∈[+382,+470]mm · 4 of 61 slices shown, 5 images]
[im 10/61  soft-tissue]
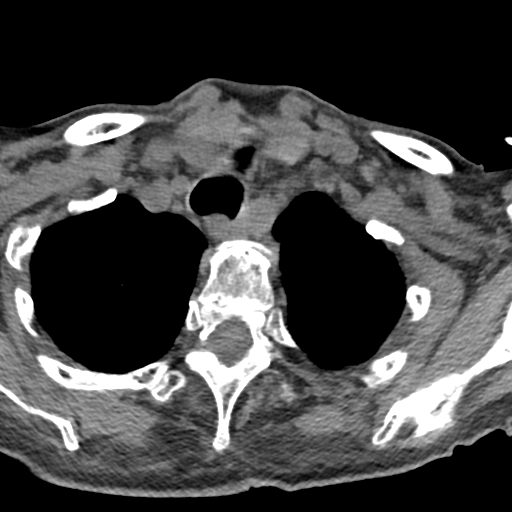
[im 10/61  bone]
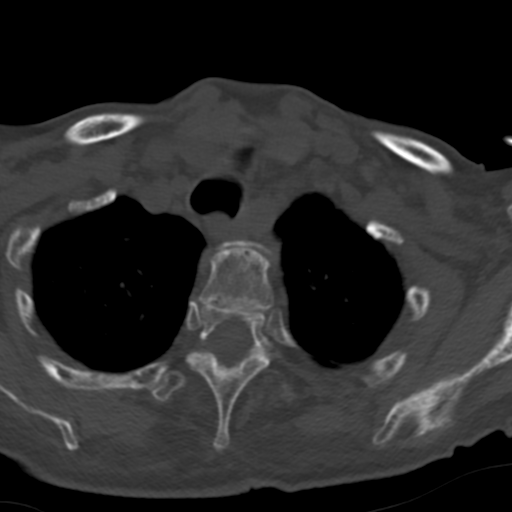
[im 24/61  bone]
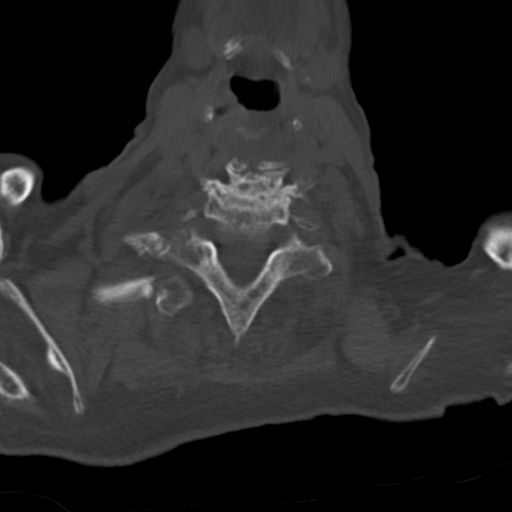
[im 37/61  bone]
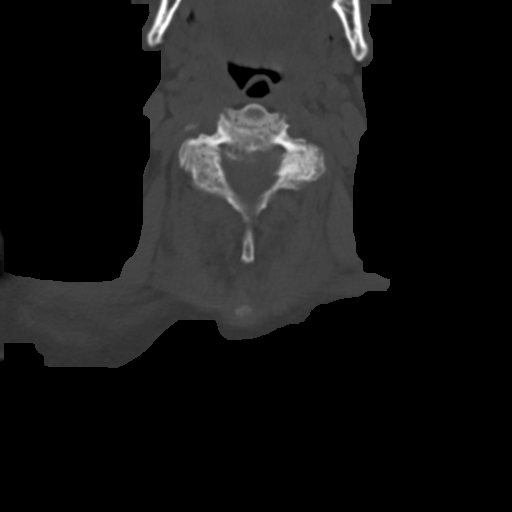
[im 51/61  bone]
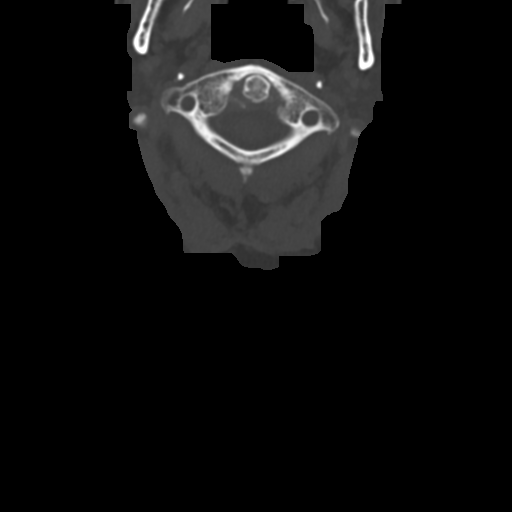

[Series 4: sagittal bone · sagittal · 0.21mm/px · 5 of 50 slices shown, 6 images]
[im 17/50  bone]
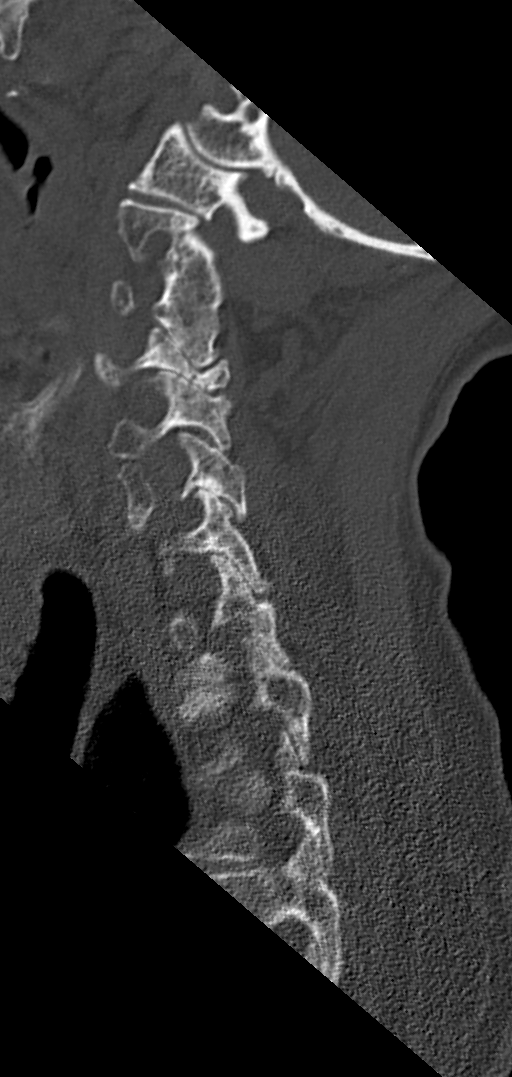
[im 21/50  bone]
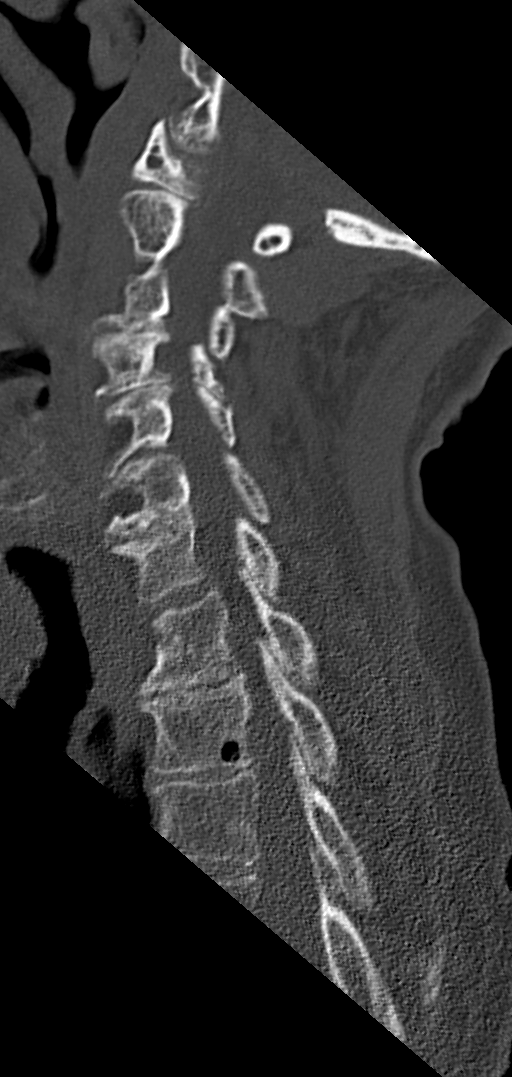
[im 25/50  soft-tissue]
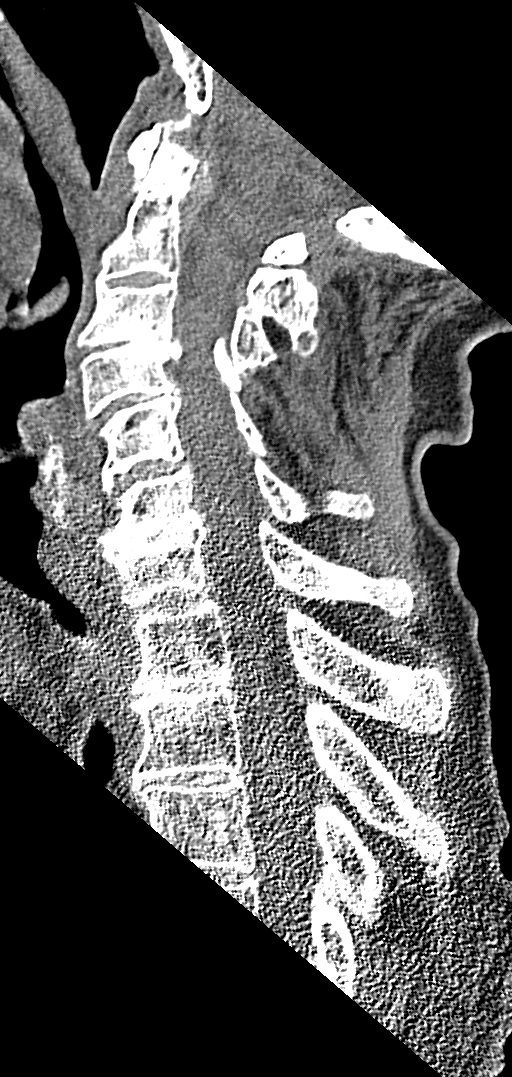
[im 25/50  bone]
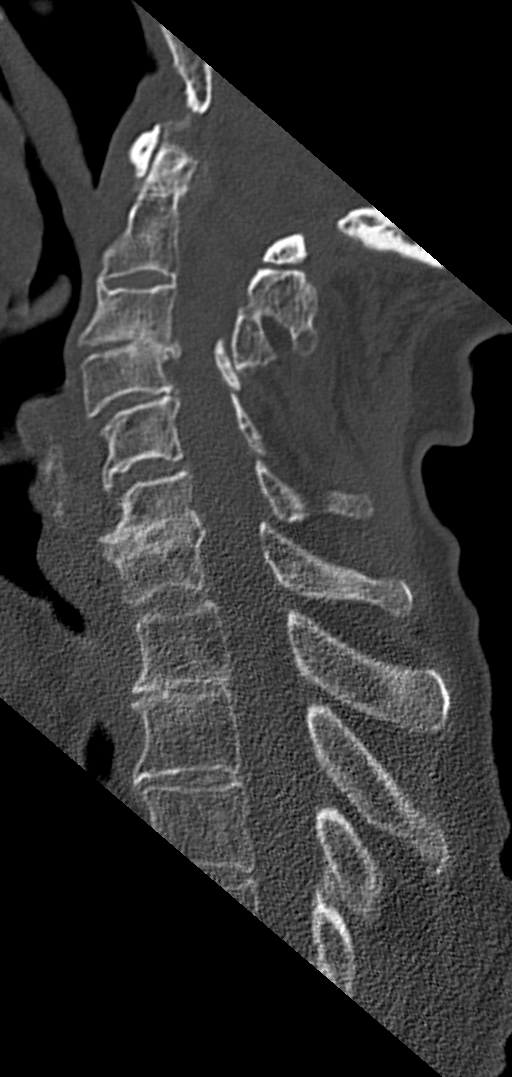
[im 29/50  bone]
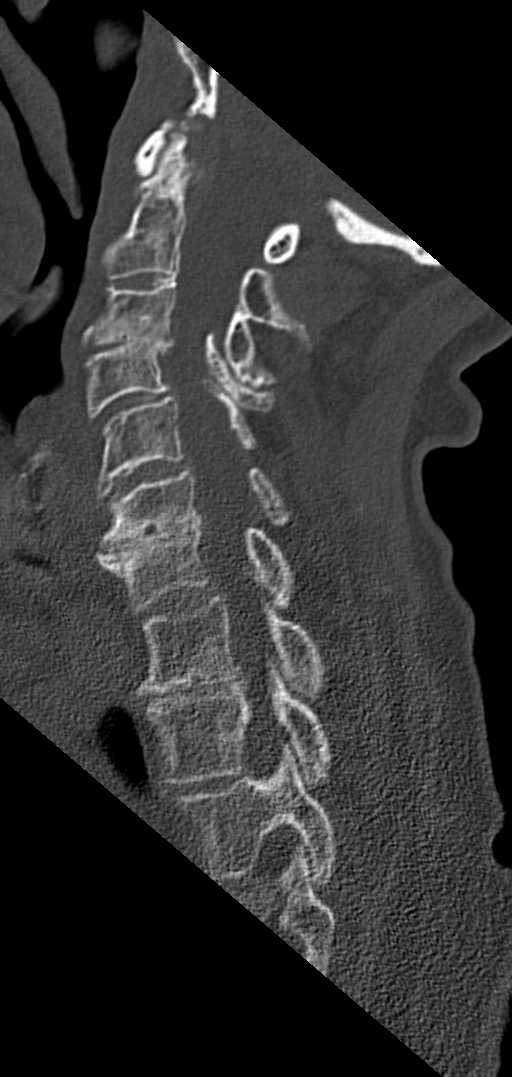
[im 33/50  bone]
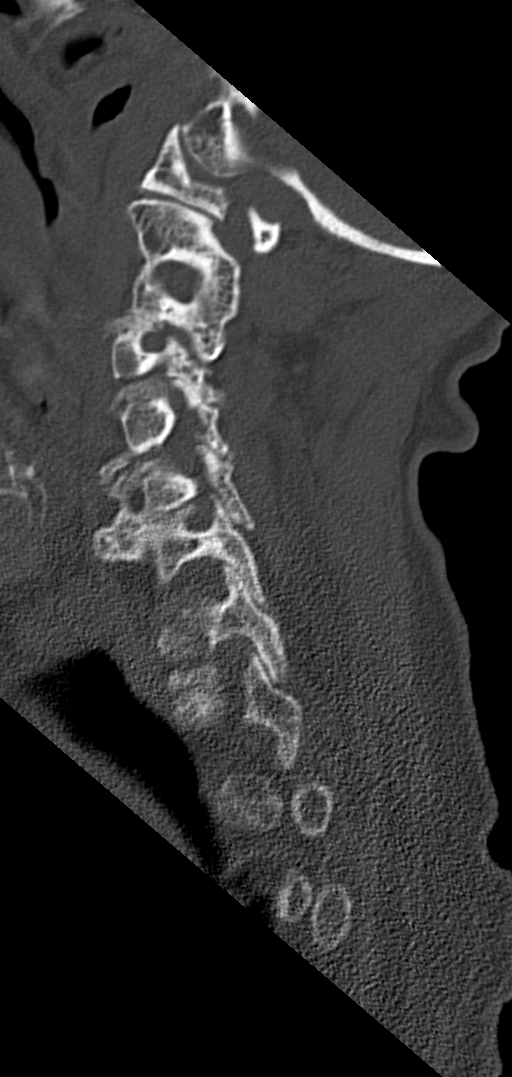

[9 of 27 positions shown; findings below may reference images not displayed]

FINDINGS: Brain: No evidence of acute territorial infarction, hemorrhage,
hydrocephalus,extra-axial collection or mass lesion/mass effect.
There is dilatation the ventricles and sulci consistent with
age-related atrophy. Low-attenuation changes in the deep white
matter consistent with small vessel ischemia.

Vascular: No hyperdense vessel or unexpected calcification.

Skull: The skull is intact. No fracture or focal lesion identified.

Sinuses/Orbits: Again noted is opacification of the left maxillary
sinus with remottling and ethmoid air cell mucosal thickening. The
orbits and globes intact.

Other: Soft tissue swelling seen over the left frontal skull.

Cervical spine:

Alignment: There is slight reversal of the normal cervical lordosis.
A minimal anterolisthesis of C4 on C5 is seen.

Skull base and vertebrae: Visualized skull base is intact. No
atlanto-occipital dissociation. The vertebral body heights are well
maintained. No fracture or pathologic osseous lesion seen.

Soft tissues and spinal canal: The visualized paraspinal soft
tissues are unremarkable. No prevertebral soft tissue swelling is
seen. The spinal canal is grossly unremarkable, no large epidural
collection or significant canal narrowing.

Disc levels: Cervical spine spondylosis is noted throughout most
notable at C3-C4 with disc osteophyte complex and uncovertebral
osteophytes which causes moderate to severe bilateral neural
foraminal narrowing and central canal stenosis.

Upper chest: The lung apices are clear. Thoracic inlet is within
normal limits.

Other: None
IMPRESSION: 1. No acute intracranial abnormality.
2. Findings consistent with age related atrophy and chronic small
vessel ischemia
3. Soft tissue swelling seen over the left frontal skull
4.  No acute fracture or malalignment of the spine.
5. Chronic sinusitis of the left maxillary sinus and ethmoid air
cells.
6. Cervical spine spondylosis most notable at C3-C4 with moderate to
severe neural foraminal and central canal stenosis.

## 2020-05-17 IMAGING — DX DG PELVIS 1-2V
1 series · 1 of 1 positions shown · non-contrast
Comparison: Pelvis and left hip radiograph 01/29/2019

CLINICAL DATA: Unwitnessed fall using the bathroom.

EXAM:
PELVIS - 1-2 VIEW

[pelvis ap]
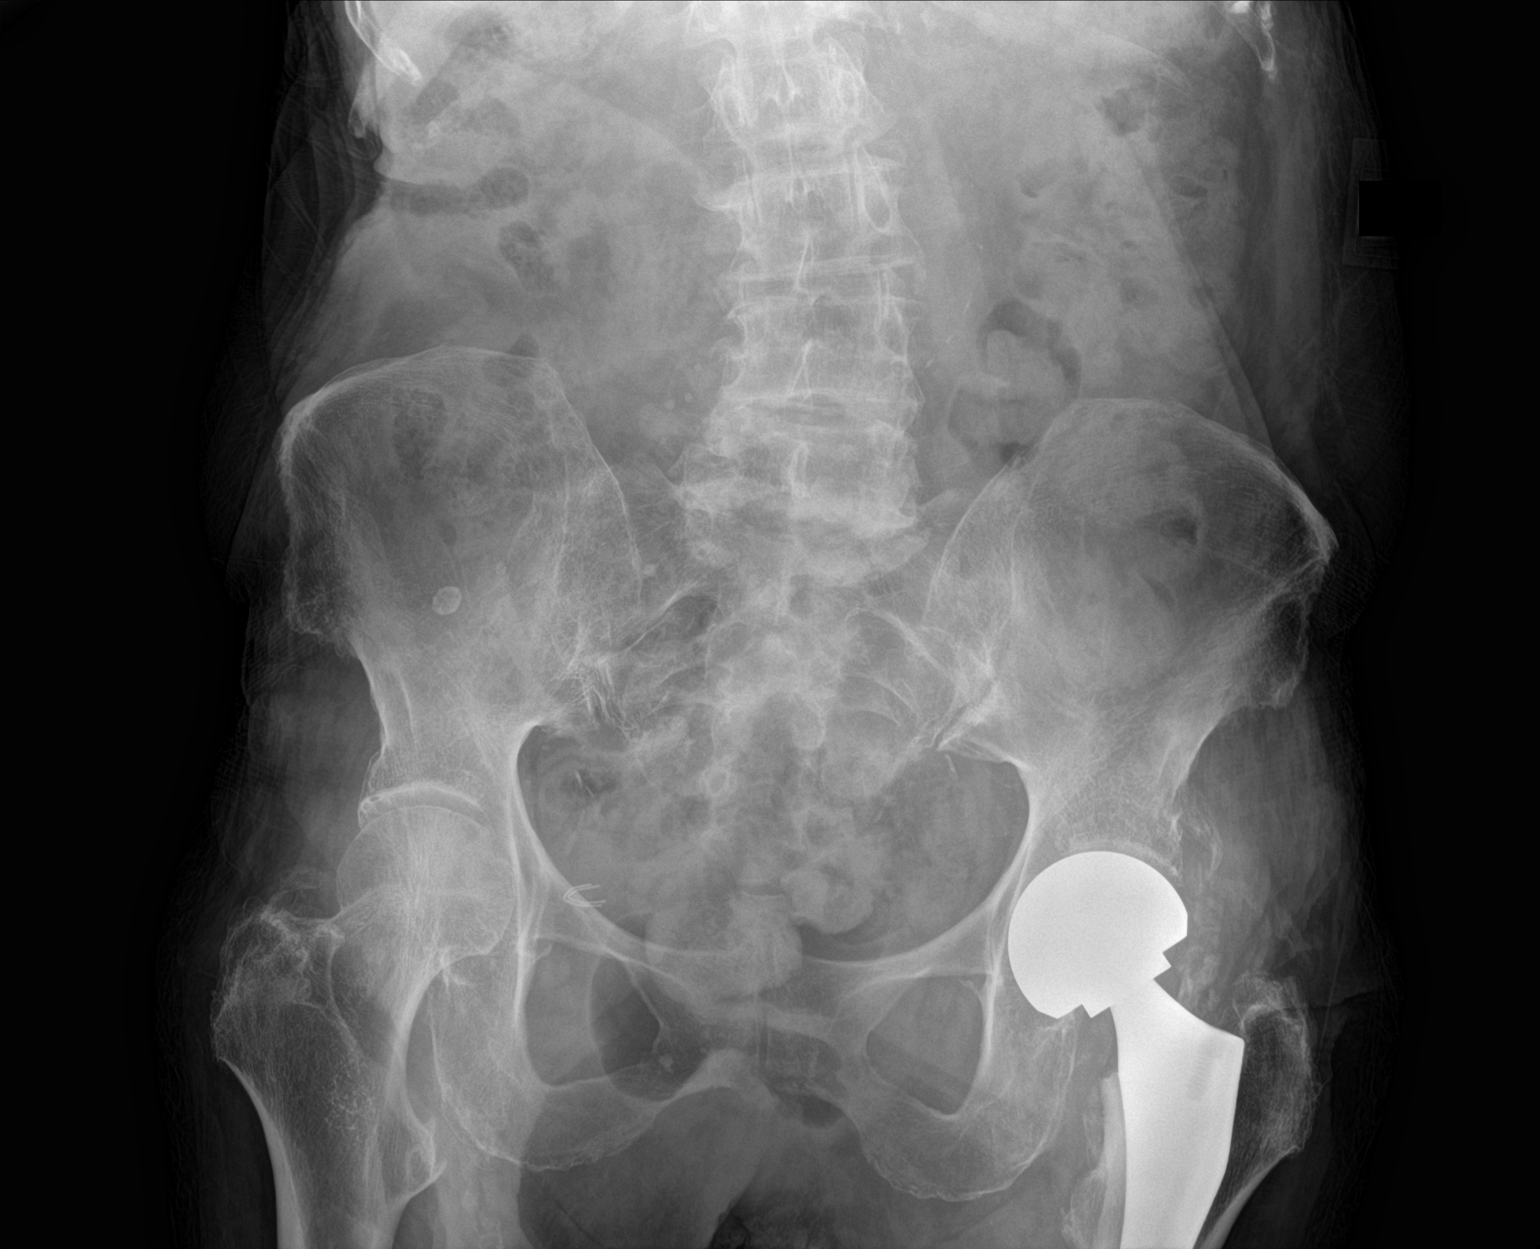

[1 of 1 positions shown; findings below may reference images not displayed]

FINDINGS: Left hip arthroplasty in unchanged alignment. No periprosthetic
fracture of the visualized hardware. Distal aspect of the femoral
stem is not included in the field of view. The cortical margins of
the bony pelvis are intact. No fracture. Pubic symphysis and
sacroiliac joints are congruent. Both femoral heads are well-seated
in the respective acetabula.
IMPRESSION: No pelvic fracture. Left hip arthroplasty in unchanged alignment,
intact were visualized. Distal aspect of the femoral stem is not
included in the field of view.
